# Patient Record
Sex: Female | Born: 1950 | Race: Black or African American | Hispanic: No | Marital: Married | Smoking: Never smoker
Health system: Southern US, Community
[De-identification: ages and names within clinical notes are randomized; demographics above are authoritative.]

## PROBLEM LIST (undated history)

## (undated) DIAGNOSIS — K279 Peptic ulcer, site unspecified, unspecified as acute or chronic, without hemorrhage or perforation: Secondary | ICD-10-CM

## (undated) DIAGNOSIS — G4710 Hypersomnia, unspecified: Secondary | ICD-10-CM

## (undated) DIAGNOSIS — E119 Type 2 diabetes mellitus without complications: Secondary | ICD-10-CM

## (undated) DIAGNOSIS — E782 Mixed hyperlipidemia: Secondary | ICD-10-CM

## (undated) DIAGNOSIS — Z95810 Presence of automatic (implantable) cardiac defibrillator: Secondary | ICD-10-CM

## (undated) DIAGNOSIS — I5042 Chronic combined systolic (congestive) and diastolic (congestive) heart failure: Secondary | ICD-10-CM

## (undated) DIAGNOSIS — R0989 Other specified symptoms and signs involving the circulatory and respiratory systems: Secondary | ICD-10-CM

## (undated) DIAGNOSIS — N959 Unspecified menopausal and perimenopausal disorder: Secondary | ICD-10-CM

## (undated) DIAGNOSIS — I43 Cardiomyopathy in diseases classified elsewhere: Secondary | ICD-10-CM

## (undated) DIAGNOSIS — I2510 Atherosclerotic heart disease of native coronary artery without angina pectoris: Secondary | ICD-10-CM

## (undated) DIAGNOSIS — E669 Obesity, unspecified: Secondary | ICD-10-CM

## (undated) DIAGNOSIS — J9610 Chronic respiratory failure, unspecified whether with hypoxia or hypercapnia: Secondary | ICD-10-CM

## (undated) DIAGNOSIS — I119 Hypertensive heart disease without heart failure: Secondary | ICD-10-CM

## (undated) DIAGNOSIS — I428 Other cardiomyopathies: Secondary | ICD-10-CM

## (undated) DIAGNOSIS — I10 Essential (primary) hypertension: Secondary | ICD-10-CM

## (undated) DIAGNOSIS — I6529 Occlusion and stenosis of unspecified carotid artery: Secondary | ICD-10-CM

## (undated) DIAGNOSIS — I509 Heart failure, unspecified: Secondary | ICD-10-CM

## (undated) HISTORY — DX: Other specified symptoms and signs involving the circulatory and respiratory systems: R09.89

## (undated) HISTORY — DX: Occlusion and stenosis of unspecified carotid artery: I65.29

## (undated) HISTORY — DX: Atherosclerotic heart disease of native coronary artery without angina pectoris: I25.10

## (undated) HISTORY — DX: Type 2 diabetes mellitus without complications: E11.9

## (undated) HISTORY — DX: Cardiomyopathy in diseases classified elsewhere: I43

## (undated) HISTORY — DX: Hypersomnia, unspecified: G47.10

## (undated) HISTORY — DX: Obesity, unspecified: E66.9

## (undated) HISTORY — DX: Hypertensive heart disease without heart failure: I11.9

## (undated) HISTORY — DX: Other cardiomyopathies: I42.8

## (undated) HISTORY — PX: OTHER SURGICAL HISTORY: SHX169

## (undated) HISTORY — DX: Mixed hyperlipidemia: E78.2

## (undated) HISTORY — DX: Chronic respiratory failure, unspecified whether with hypoxia or hypercapnia: J96.10

## (undated) HISTORY — DX: Essential (primary) hypertension: I10

## (undated) HISTORY — DX: Unspecified menopausal and perimenopausal disorder: N95.9

## (undated) HISTORY — DX: Chronic combined systolic (congestive) and diastolic (congestive) heart failure: I50.42

## (undated) HISTORY — DX: Peptic ulcer, site unspecified, unspecified as acute or chronic, without hemorrhage or perforation: K27.9

---

## 2002-10-26 ENCOUNTER — Encounter: Payer: Self-pay | Admitting: *Deleted

## 2002-10-26 ENCOUNTER — Emergency Department (HOSPITAL_COMMUNITY): Admission: EM | Admit: 2002-10-26 | Discharge: 2002-10-26 | Payer: Self-pay | Admitting: *Deleted

## 2003-05-18 ENCOUNTER — Emergency Department (HOSPITAL_COMMUNITY): Admission: EM | Admit: 2003-05-18 | Discharge: 2003-05-18 | Payer: Self-pay | Admitting: Emergency Medicine

## 2003-06-19 ENCOUNTER — Ambulatory Visit (HOSPITAL_COMMUNITY): Admission: RE | Admit: 2003-06-19 | Discharge: 2003-06-19 | Payer: Self-pay | Admitting: Unknown Physician Specialty

## 2012-07-23 ENCOUNTER — Emergency Department (HOSPITAL_COMMUNITY)
Admission: EM | Admit: 2012-07-23 | Discharge: 2012-07-23 | Disposition: A | Discharge status: Home / Self Care | Payer: Medicare Other | Attending: Emergency Medicine | Admitting: Emergency Medicine

## 2012-07-23 ENCOUNTER — Emergency Department (HOSPITAL_COMMUNITY): Payer: Medicare Other

## 2012-07-23 ENCOUNTER — Encounter (HOSPITAL_COMMUNITY): Payer: Self-pay | Admitting: Emergency Medicine

## 2012-07-23 DIAGNOSIS — E119 Type 2 diabetes mellitus without complications: Secondary | ICD-10-CM | POA: Insufficient documentation

## 2012-07-23 DIAGNOSIS — R0989 Other specified symptoms and signs involving the circulatory and respiratory systems: Secondary | ICD-10-CM | POA: Insufficient documentation

## 2012-07-23 DIAGNOSIS — R0609 Other forms of dyspnea: Secondary | ICD-10-CM | POA: Insufficient documentation

## 2012-07-23 DIAGNOSIS — J90 Pleural effusion, not elsewhere classified: Secondary | ICD-10-CM

## 2012-07-23 DIAGNOSIS — R0600 Dyspnea, unspecified: Secondary | ICD-10-CM

## 2012-07-23 DIAGNOSIS — Z79899 Other long term (current) drug therapy: Secondary | ICD-10-CM | POA: Insufficient documentation

## 2012-07-23 DIAGNOSIS — I10 Essential (primary) hypertension: Secondary | ICD-10-CM | POA: Insufficient documentation

## 2012-07-23 DIAGNOSIS — I509 Heart failure, unspecified: Secondary | ICD-10-CM | POA: Insufficient documentation

## 2012-07-23 HISTORY — DX: Heart failure, unspecified: I50.9

## 2012-07-23 LAB — CBC WITH DIFFERENTIAL/PLATELET
Basophils Absolute: 0 10*3/uL (ref 0.0–0.1)
Basophils Relative: 0 % (ref 0–1)
Eosinophils Absolute: 0.9 10*3/uL — ABNORMAL HIGH (ref 0.0–0.7)
HCT: 40.2 % (ref 36.0–46.0)
Hemoglobin: 13.7 g/dL (ref 12.0–15.0)
MCH: 30 pg (ref 26.0–34.0)
MCHC: 34.1 g/dL (ref 30.0–36.0)
Monocytes Absolute: 0.7 10*3/uL (ref 0.1–1.0)
Monocytes Relative: 9 % (ref 3–12)
Neutrophils Relative %: 48 % (ref 43–77)
RDW: 13.2 % (ref 11.5–15.5)

## 2012-07-23 LAB — BASIC METABOLIC PANEL
BUN: 10 mg/dL (ref 6–23)
Creatinine, Ser: 0.66 mg/dL (ref 0.50–1.10)
GFR calc Af Amer: >90 mL/min (ref >90)
GFR calc non Af Amer: >90 mL/min (ref >90)

## 2012-07-23 LAB — PRO B NATRIURETIC PEPTIDE: Pro B Natriuretic peptide (BNP): 225.9 pg/mL — ABNORMAL HIGH (ref 0–125)

## 2012-07-23 MED ORDER — IOHEXOL 350 MG/ML SOLN
100.0000 mL | Freq: Once | INTRAVENOUS | Status: AC | PRN
  Administered 2012-07-23: 100 mL via INTRAVENOUS

## 2012-07-23 MED ORDER — ASPIRIN 81 MG PO CHEW
324.0000 mg | CHEWABLE_TABLET | Freq: Once | ORAL | Status: AC
  Administered 2012-07-23: 324 mg via ORAL
  Filled 2012-07-23: qty 4

## 2012-07-23 MED ORDER — FUROSEMIDE 20 MG PO TABS: 20.0000 mg | ORAL_TABLET | Freq: Every day | ORAL | Status: DC

## 2012-07-23 NOTE — ED Provider Notes (Signed)
History     CSN: [id number]  Arrival date & time 07/23/12  1532   First MD Initiated Contact with Patient 07/23/12 1653      Chief Complaint  Patient presents with  . Shortness of Breath    (Consider location/radiation/quality/duration/timing/severity/associated sxs/prior treatment) Patient is a 62 y.o. female presenting with shortness of breath. The history is provided by the patient.  Shortness of Breath She has been having progressive exertional dyspnea over the last week. She gets short of breath with putting on her clothes or walking short distances as short as 40 or 50 feet. She did have some aching pain in the right upper back which lasts about 3 days but has been gone for the last 2 days. She has a slight cough which is productive of a small amount of clear sputum. She denies chills or sweats. Denies nausea or vomiting. She's not tried anything to help her symptoms. She does have history of congestive heart failure and always sleeps sitting up because she gets short of breath if she lays flat. This has not changed. She has not noticed any change in the swelling in her legs. On one other occasion in the past, she had similar trouble breathing and she saw a lung specialist for 3 months with no diagnosis having been made.  Past Medical History  Diagnosis Date  . Hypertension   . CHF (congestive heart failure)   . Diabetes mellitus without complication     History reviewed. No pertinent past surgical history.  No family history on file.  History  Substance Use Topics  . Smoking status: Never Smoker   . Smokeless tobacco: Not on file  . Alcohol Use: No    OB History   Grav Para Term Preterm Abortions TAB SAB Ect Mult Living                  Review of Systems  Respiratory: Positive for shortness of breath.   All other systems reviewed and are negative.    Allergies  Review of patient's allergies indicates no known allergies.  Home Medications   Current  Outpatient Rx  Name  Route  Sig  Dispense  Refill  . carvedilol (COREG) 25 MG tablet   Oral   Take 25 mg by mouth 2 (two) times daily with a meal.         . metFORMIN (GLUCOPHAGE) 500 MG tablet   Oral   Take 500 mg by mouth 2 (two) times daily with a meal.         . omeprazole (PRILOSEC) 20 MG capsule   Oral   Take 20 mg by mouth 2 (two) times daily.           BP 181/86  Pulse 92  Temp(Src) 98.4 F (36.9 C) (Oral)  SpO2 96%  Physical Exam  Nursing note and vitals reviewed.  62 year old female, resting comfortably and in no acute distress. Vital signs are significant for hypertension with blood pressure 181/86. Oxygen saturation is 96%, which is normal. Head is normocephalic and atraumatic. PERRLA, EOMI. Oropharynx is clear. Neck is nontender and supple without adenopathy or JVD. Back is nontender and there is no CVA tenderness. There is no presacral edema. Lungs have decreased breath sounds with faint rales at both bases. There are no wheezes or rhonchi. Chest is nontender. Heart has regular rate and rhythm without murmur. Abdomen is soft, flat, nontender without masses or hepatosplenomegaly and peristalsis is normoactive. Extremities have 1-2+ edema,  full range of motion is present. Skin is warm and dry without rash. Neurologic: Mental status is normal, cranial nerves are intact, there are no motor or sensory deficits.  ED Course  Procedures (including critical care time)  Results for orders placed during the hospital encounter of 07/23/12  CBC WITH DIFFERENTIAL      Result Value Range   WBC 8.0  4.0 - 10.5 K/uL   RBC 4.56  3.87 - 5.11 MIL/uL   Hemoglobin 13.7  12.0 - 15.0 g/dL   HCT 40.9  81.1 - 91.4 %   MCV 88.2  78.0 - 100.0 fL   MCH 30.0  26.0 - 34.0 pg   MCHC 34.1  30.0 - 36.0 g/dL   RDW 78.2  95.6 - 21.3 %   Platelets 241  150 - 400 K/uL   Neutrophils Relative 48  43 - 77 %   Neutro Abs 3.8  1.7 - 7.7 K/uL   Lymphocytes Relative 32  12 - 46 %    Lymphs Abs 2.5  0.7 - 4.0 K/uL   Monocytes Relative 9  3 - 12 %   Monocytes Absolute 0.7  0.1 - 1.0 K/uL   Eosinophils Relative 11 (*) 0 - 5 %   Eosinophils Absolute 0.9 (*) 0.0 - 0.7 K/uL   Basophils Relative 0  0 - 1 %   Basophils Absolute 0.0  0.0 - 0.1 K/uL  BASIC METABOLIC PANEL      Result Value Range   Sodium 139  135 - 145 mEq/L   Potassium 3.6  3.5 - 5.1 mEq/L   Chloride 103  96 - 112 mEq/L   CO2 28  19 - 32 mEq/L   Glucose, Bld 155 (*) 70 - 99 mg/dL   BUN 10  6 - 23 mg/dL   Creatinine, Ser 0.86  0.50 - 1.10 mg/dL   Calcium 57.8  8.4 - 46.9 mg/dL   GFR calc non Af Amer >90  >90 mL/min   GFR calc Af Amer >90  >90 mL/min  PRO B NATRIURETIC PEPTIDE      Result Value Range   Pro B Natriuretic peptide (BNP) 225.9 (*) 0 - 125 pg/mL  TROPONIN I      Result Value Range   Troponin I <0.30  <0.30 ng/mL  D-DIMER, QUANTITATIVE      Result Value Range   D-Dimer, Quant 2.90 (*) 0.00 - 0.48 ug/mL-FEU   Dg Chest 2 View  07/23/2012  *RADIOLOGY REPORT*  Clinical Data: Right chest pain.  Shortness of breath.  CHEST - 2 VIEW  Comparison: Plain film chest 06/19/2003.  Findings: The patient has a moderate right pleural effusion and right basilar airspace disease.  Trace left pleural effusion is noted.  There is cardiomegaly and mild interstitial edema.  IMPRESSION:  1.  Right effusion basilar airspace disease which could represent atelectasis or pneumonia. 2.  Cardiomegaly and mild interstitial edema.   Original Report Authenticated By: Holley Dexter, M.D.    Ct Angio Chest W/cm &/or Wo Cm  07/23/2012  *RADIOLOGY REPORT*  Clinical Data: Shortness of breath, elevated D-dimer, question pulmonary embolism, history hypertension, CHF, diabetes  CT ANGIOGRAPHY CHEST  Technique:  Multidetector CT imaging of the chest using the standard protocol during bolus administration of intravenous contrast. Multiplanar reconstructed images including MIPs were obtained and reviewed to evaluate the vascular  anatomy.  Contrast: OMNIPAQUE IOHEXOL 350 MG/ML SOLN  Comparison: None  Findings: Aorta normal caliber with scattered atherosclerotic calcifications of the  aorta and coronary arteries. No aortic dissection identified. Beam hardening artifacts from dense contrast in SVC. Pulmonary arteries patent. No evidence of pulmonary embolism.  Visualized portion of upper abdomen normal appearance. Small to moderate right pleural effusion, partially loculated. Normal-sized lymph nodes at right hilum without definite thoracic adenopathy. Significant atelectasis right lower lobe, minimally left lower lobe and right middle lobe. No definite infiltrate or pneumothorax. No acute osseous findings.  IMPRESSION: No evidence of pulmonary embolism. Small to moderate right pleural effusion which is partially loculated. Scattered atelectasis greatest right lower lobe.   Original Report Authenticated By: Ulyses Southward, M.D.     Images viewed by me.   Date: 07/23/2012  Rate: 80  Rhythm: normal sinus rhythm  QRS Axis: normal  Intervals: normal  ST/T Wave abnormalities: nonspecific ST/T changes  Conduction Disutrbances:none  Narrative Interpretation: Left ventricular hypertrophy with secondary repolarization changes. No prior ECG available for comparison.  Old EKG Reviewed: none available    1. Dyspnea   2. Pleural effusion, right       MDM  Dyspnea which most likely is an exacerbation of congestive heart failure. 2 we'll need to be screened for her possible pulmonary emboli since CHF is a risk factor for PE. Workup has been initiated including chest x-ray, BNP, d-dimer.  X-ray shows small pleural effusion. BNP is mildly elevated. D-dimer is markedly elevated. She'll be sent for CT angiogram to rule out pulmonary bolus him.  CT angiogram shows of her pleural effusion is larger than appreciated on chest x-ray and there are also areas of atelectasis. This seems to be what is causing her dyspnea. She does have  peripheral edema and is slightly elevated BNP. She'll be started on furosemide. She states that she has been on furosemide in the past but it was stopped and she's not sure what dose she was on. Presumably, she had it stopped because of some problems with renal function so she'll be started back on a low dose of furosemide. She's given a prescription for furosemide 20 mg and she is to followup with her cardiologist in the coming week.  Dione Booze, MD 07/23/12 2155

## 2012-07-23 NOTE — ED Notes (Signed)
Pt states SOB unchanged over past 7 days but always worsens with any movement

## 2012-07-23 NOTE — ED Notes (Signed)
Pt states she has been SOBx 7 days, states any movement  Makes it worse. Denies CP at this time but states she did have some R upper chest pain near clavicle 5 days ago. Denies swelling to legs.

## 2012-07-23 NOTE — ED Notes (Signed)
Attempted to start 20 gtt IV in the Lt. Forearm. unsuccessful stick. Charge RN to attempt at this time. Campos[employer]Garcia, [employer]

## 2012-08-08 ENCOUNTER — Encounter (HOSPITAL_COMMUNITY): Payer: Self-pay | Admitting: *Deleted

## 2012-08-08 ENCOUNTER — Emergency Department (HOSPITAL_COMMUNITY): Payer: Medicare Other

## 2012-08-08 ENCOUNTER — Emergency Department (HOSPITAL_COMMUNITY)
Admission: EM | Admit: 2012-08-08 | Discharge: 2012-08-08 | Disposition: A | Discharge status: Home / Self Care | Payer: Medicare Other | Attending: Emergency Medicine | Admitting: Emergency Medicine

## 2012-08-08 DIAGNOSIS — I10 Essential (primary) hypertension: Secondary | ICD-10-CM | POA: Insufficient documentation

## 2012-08-08 DIAGNOSIS — R000 Tachycardia, unspecified: Secondary | ICD-10-CM | POA: Insufficient documentation

## 2012-08-08 DIAGNOSIS — E119 Type 2 diabetes mellitus without complications: Secondary | ICD-10-CM | POA: Insufficient documentation

## 2012-08-08 DIAGNOSIS — I509 Heart failure, unspecified: Secondary | ICD-10-CM

## 2012-08-08 DIAGNOSIS — Z79899 Other long term (current) drug therapy: Secondary | ICD-10-CM | POA: Insufficient documentation

## 2012-08-08 DIAGNOSIS — R509 Fever, unspecified: Secondary | ICD-10-CM | POA: Insufficient documentation

## 2012-08-08 DIAGNOSIS — M7989 Other specified soft tissue disorders: Secondary | ICD-10-CM | POA: Insufficient documentation

## 2012-08-08 LAB — CBC
HCT: 42.5 % (ref 36.0–46.0)
MCH: 29.3 pg (ref 26.0–34.0)
MCV: 88.9 fL (ref 78.0–100.0)
Platelets: 264 10*3/uL (ref 150–400)
RBC: 4.78 MIL/uL (ref 3.87–5.11)
WBC: 7.4 10*3/uL (ref 4.0–10.5)

## 2012-08-08 LAB — POCT I-STAT, CHEM 8
Calcium, Ion: 1.31 mmol/L — ABNORMAL HIGH (ref 1.13–1.30)
Chloride: 105 mEq/L (ref 96–112)
Creatinine, Ser: 0.7 mg/dL (ref 0.50–1.10)
Glucose, Bld: 204 mg/dL — ABNORMAL HIGH (ref 70–99)
Potassium: 3.9 mEq/L (ref 3.5–5.1)

## 2012-08-08 MED ORDER — FUROSEMIDE 10 MG/ML IJ SOLN
40.0000 mg | Freq: Once | INTRAMUSCULAR | Status: AC
  Administered 2012-08-08: 40 mg via INTRAVENOUS
  Filled 2012-08-08: qty 4

## 2012-08-08 NOTE — ED Notes (Signed)
EKG old and new given to EDP, Dierdre Highman, MD.

## 2012-08-08 NOTE — ED Provider Notes (Signed)
History     CSN: [id number]  Arrival date & time 08/08/12  0212   First MD Initiated Contact with Patient 08/08/12 0305      Chief Complaint  Patient presents with  . Shortness of Breath    (Consider location/radiation/quality/duration/timing/severity/associated sxs/prior treatment) HPI History per patient. SOB worse over the last 24 hours, worse with ambulation, has noticed some inc ankle swelling and has h/o CHF. No Cp or cough.  Felt feverish tonight no chills. No rash. No N/V some diarrhea no blood in stools. No ABD pain. Is prescribed lasix and takes it intermittently - states I have to go to church and I cant take it on the weekends. Symptoms MOD in severity. Has slept sitting up for years unable to lay flat Past Medical History  Diagnosis Date  . Hypertension   . CHF (congestive heart failure)   . Diabetes mellitus without complication     History reviewed. No pertinent past surgical history.  No family history on file.  History  Substance Use Topics  . Smoking status: Never Smoker   . Smokeless tobacco: Not on file  . Alcohol Use: No    OB History   Grav Para Term Preterm Abortions TAB SAB Ect Mult Living                  Review of Systems  Constitutional: Negative for fever and chills.  HENT: Negative for neck pain and neck stiffness.   Eyes: Negative for pain.  Respiratory: Positive for shortness of breath.   Cardiovascular: Positive for leg swelling. Negative for chest pain.  Gastrointestinal: Negative for abdominal pain.  Genitourinary: Negative for dysuria.  Musculoskeletal: Negative for back pain.  Skin: Negative for rash.  Neurological: Negative for headaches.  All other systems reviewed and are negative.    Allergies  Review of patient's allergies indicates no known allergies.  Home Medications   Current Outpatient Rx  Name  Route  Sig  Dispense  Refill  . carvedilol (COREG) 25 MG tablet   Oral   Take 25 mg by mouth 2 (two) times daily  with a meal.         . cyclobenzaprine (FLEXERIL) 10 MG tablet   Oral   Take 10 mg by mouth 2 (two) times daily as needed for muscle spasms.         . furosemide (LASIX) 20 MG tablet   Oral   Take 1 tablet (20 mg total) by mouth daily.   15 tablet   0   . glipiZIDE (GLUCOTROL) 5 MG tablet   Oral   Take 5 mg by mouth daily.         Marland Kitchen HYDROcodone-acetaminophen (NORCO/VICODIN) 5-325 MG per tablet   Oral   Take 1 tablet by mouth every 6 (six) hours as needed for pain.         Marland Kitchen losartan (COZAAR) 50 MG tablet   Oral   Take 50 mg by mouth daily.         . metFORMIN (GLUCOPHAGE) 500 MG tablet   Oral   Take 1,000 mg by mouth 2 (two) times daily with a meal.          . nitrofurantoin, macrocrystal-monohydrate, (MACROBID) 100 MG capsule   Oral   Take 100 mg by mouth 2 (two) times daily.         Marland Kitchen omeprazole (PRILOSEC) 20 MG capsule   Oral   Take 20 mg by mouth 2 (two) times daily.  BP 161/95  Pulse 109  Temp(Src) 97.7 F (36.5 C)  Resp 28  SpO2 93%  Physical Exam  Constitutional: She is oriented to person, place, and time. She appears well-developed and well-nourished.  HENT:  Head: Normocephalic and atraumatic.  Eyes: EOM are normal. Pupils are equal, round, and reactive to light.  Neck: Neck supple.  Cardiovascular: Regular rhythm and intact distal pulses.   Mild tachycardia  Pulmonary/Chest: Effort normal. No respiratory distress.  mildly dec bilateral breath sounds with posterior rales  Abdominal: Bowel sounds are normal.  Musculoskeletal: Normal range of motion. She exhibits no edema.  Neurological: She is alert and oriented to person, place, and time.  Skin: Skin is warm and dry.    ED Course  Procedures (including critical care time)  Results for orders placed during the hospital encounter of 08/08/12  CBC      Result Value Range   WBC 7.4  4.0 - 10.5 K/uL   RBC 4.78  3.87 - 5.11 MIL/uL   Hemoglobin 14.0  12.0 - 15.0 g/dL    HCT 16.1  09.6 - 04.5 %   MCV 88.9  78.0 - 100.0 fL   MCH 29.3  26.0 - 34.0 pg   MCHC 32.9  30.0 - 36.0 g/dL   RDW 40.9  81.1 - 91.4 %   Platelets 264  150 - 400 K/uL  PRO B NATRIURETIC PEPTIDE      Result Value Range   Pro B Natriuretic peptide (BNP) 260.4 (*) 0 - 125 pg/mL  POCT I-STAT, CHEM 8      Result Value Range   Sodium 141  135 - 145 mEq/L   Potassium 3.9  3.5 - 5.1 mEq/L   Chloride 105  96 - 112 mEq/L   BUN 10  6 - 23 mg/dL   Creatinine, Ser 7.82  0.50 - 1.10 mg/dL   Glucose, Bld 956 (*) 70 - 99 mg/dL   Calcium, Ion 2.13 (*) 1.13 - 1.30 mmol/L   TCO2 31  0 - 100 mmol/L   Hemoglobin 15.0  12.0 - 15.0 g/dL   HCT 08.6  57.8 - 46.9 %  POCT I-STAT TROPONIN I      Result Value Range   Troponin i, poc 0.02  0.00 - 0.08 ng/mL   Comment 3              Dg Chest Portable 1 View  08/08/2012  *RADIOLOGY REPORT*  Clinical Data: Short of breath.  PORTABLE CHEST - 1 VIEW  Comparison: CT 07/23/2012.  Findings: Right pleural effusion is present which is subpulmonic. Mild basilar predominant airspace disease is present along with pulmonary vascular congestion consistent with pulmonary edema. Collapse / consolidation of the right lung base.  No pneumothorax. Cardiopericardial silhouette is mildly enlarged. Although superimposed pneumonia is difficult to exclude, the constellation of findings favors CHF.  IMPRESSION: Findings most compatible with mild to moderate CHF with right pleural effusion and likely compressive atelectasis of the right lower lobe.   Original Report Authenticated By: Andreas Newport, M.D.        Date: 08/08/2012  Rate: 104  Rhythm: sinus tachycardia  QRS Axis: normal  Intervals: normal  ST/T Wave abnormalities: nonspecific ST/T changes  Conduction Disutrbances:none  Narrative Interpretation: ST with LVH  Old EKG Reviewed: unchanged  IV Lasix, O2 provided  5:13 AM ambulating no acute distress. No sig hypoxia. Patient requesting to be discharged home. She agrees  to take Lasix as prescribed and followup with her physician.  MDM  Mild CHF exacerbation due to medical noncompliance  Evaluated with chest x-ray, EKG and labs reviewed as above  Lasix provided and serial evaluations performed  Patient is comfortable for discharge home. Plan close outpatient followup  Vital signs and nursing notes reviewed        Sunnie Nielsen, MD 08/08/12 [id number]

## 2012-08-08 NOTE — ED Notes (Signed)
Pt c/o shortness of breath since 8pm; felt like she had a fever; bp elevated and felt like heart rate elevated; denies chest pain; pain left lower rib cage in back

## 2012-08-08 NOTE — ED Notes (Addendum)
Pt. Oxygen level 91% room air while ambulating down the hall and back to her room without assistance. Pt. States she felt fine while ambulating down the hall. Pt. Gait was steady.

## 2013-02-20 ENCOUNTER — Encounter: Payer: Self-pay | Admitting: Cardiology

## 2013-02-20 DIAGNOSIS — I509 Heart failure, unspecified: Secondary | ICD-10-CM | POA: Insufficient documentation

## 2013-02-20 DIAGNOSIS — E119 Type 2 diabetes mellitus without complications: Secondary | ICD-10-CM | POA: Insufficient documentation

## 2013-02-20 DIAGNOSIS — I10 Essential (primary) hypertension: Secondary | ICD-10-CM | POA: Insufficient documentation

## 2013-02-21 ENCOUNTER — Ambulatory Visit (INDEPENDENT_AMBULATORY_CARE_PROVIDER_SITE_OTHER): Payer: Medicare Other | Admitting: Cardiology

## 2013-02-21 ENCOUNTER — Encounter: Payer: Self-pay | Admitting: Cardiology

## 2013-02-21 ENCOUNTER — Encounter: Payer: Self-pay | Admitting: *Deleted

## 2013-02-21 ENCOUNTER — Other Ambulatory Visit: Payer: Self-pay | Admitting: Cardiology

## 2013-02-21 VITALS — BP 168/90 | HR 83 | Ht 64.0 in | Wt 237.1 lb

## 2013-02-21 DIAGNOSIS — I10 Essential (primary) hypertension: Secondary | ICD-10-CM

## 2013-02-21 DIAGNOSIS — I428 Other cardiomyopathies: Secondary | ICD-10-CM

## 2013-02-21 DIAGNOSIS — I420 Dilated cardiomyopathy: Secondary | ICD-10-CM | POA: Insufficient documentation

## 2013-02-21 DIAGNOSIS — I509 Heart failure, unspecified: Secondary | ICD-10-CM

## 2013-02-21 MED ORDER — LOSARTAN POTASSIUM 100 MG PO TABS: 100.0000 mg | ORAL_TABLET | Freq: Every day | ORAL | Status: DC

## 2013-02-21 NOTE — Assessment & Plan Note (Signed)
Continue present dose of Lasix.we will most likely add spironolactone in the future.

## 2013-02-21 NOTE — Progress Notes (Signed)
HPI: 62 year old female for evaluation of congestive heart failure/cardiomyopathy. Previously followed in [facility]. Last echocardiogram in July of 2014 showed an ejection fraction of 25-30% with grade 2 diastolic dysfunction. There was mild left atrial enlargement. There was mild mitral regurgitation. Nuclear study in April of 2014 showed normal perfusion. Ejection fraction was 24%. Carotid Dopplers in April 2014 showed less than 39% left carotid stenosis. Chest CT in March of 2014 showed no pulmonary embolus, small to moderate right effusion which was felt to be partially loculated. Apparently patient has had poor compliance with treatment and poor tolerance of treatment based on outside records. Patient states that she has had dyspnea on exertion in the past. However over the past year it has worsened markedly. She has chronic orthopnea. No PND, palpitations or chest pain. Occasional mild pedal edema.  Current Outpatient Prescriptions  Medication Sig Dispense Refill  . aspirin 81 MG tablet Take 81 mg by mouth daily.      . carvedilol (COREG) 25 MG tablet Take 25 mg by mouth 2 (two) times daily with a meal.      . furosemide (LASIX) 20 MG tablet Take 1 tablet (20 mg total) by mouth daily.  15 tablet  0  . glipiZIDE (GLUCOTROL) 5 MG tablet Take 5 mg by mouth daily.      Marland Kitchen losartan (COZAAR) 50 MG tablet Take 50 mg by mouth daily.      . metFORMIN (GLUCOPHAGE) 500 MG tablet Take 1,000 mg by mouth 2 (two) times daily with a meal.       . omeprazole (PRILOSEC) 20 MG capsule Take 20 mg by mouth 2 (two) times daily.       No current facility-administered medications for this visit.    No Known Allergies  Past Medical History  Diagnosis Date  . Hypertensive cardiomyopathy     By report there is poor compliance w/treatment, poor tolerance of treatment & poor symptom control. Symptoms include dyspnea & DOE.  . Essential hypertension, benign   . Diabetes mellitus, type 2   . Obesity   .  Hyperlipidemia, mixed   . Unspecified menopausal and postmenopausal disorder   . Bruit     Bilat carotid duplex 09/05/12 CONCLUSION: Mild, less than or equal to 39%, left internal carotid artery stenosis.  . Hypersomnia, unspecified   . CHF (congestive heart failure)     MPI 09/05/12 NORMAL, showed no scar & LVEF is 20-25%. ECHO 12/11/12 LV is markedly dilated, Overall LV systolic function severely impaired with EF = 25-30%, pseudonormal LV filling pattern (consistant with elevated LA pressure).  . PUD (peptic ulcer disease)     History reviewed. No pertinent past surgical history.  History   Social History  . Marital Status: Married    Spouse Name: N/A    Number of Children: 5  . Years of Education: N/A   Occupational History  .      Retired   Social History Main Topics  . Smoking status: Never Smoker   . Smokeless tobacco: Not on file  . Alcohol Use: No  . Drug Use: No  . Sexual Activity: No   Other Topics Concern  . Not on file   Social History Narrative  . No narrative on file    Family History  Problem Relation Age of Onset  . Diabetes Mellitus II Mother   . Cancer Brother     Colon  . Hypertension Father     ROS: no fevers or  chills, productive cough, hemoptysis, dysphasia, odynophagia, melena, hematochezia, dysuria, hematuria, rash, seizure activity, claudication. Remaining systems are negative.  Physical Exam:   Blood pressure 168/90, pulse 83, height 5\' 4"  (1.626 m), weight 237 lb 1.9 oz (107.557 kg).  General:  Well developed/obese in NAD Skin warm/dry Patient not depressed No peripheral clubbing Back-normal HEENT-normal/normal eyelids Neck supple/normal carotid upstroke bilaterally; no bruits; no JVD; no thyromegaly chest - CTA/ normal expansion CV - RRR/normal S1 and S2; no murmurs, rubs or gallops;  PMI nondisplaced Abdomen -NT/ND, no HSM, no mass, + bowel sounds, no bruit 2+ femoral pulses, no bruits Ext-trace edema, no chords, 2+  DP Neuro-grossly nonfocal  ECG Sinus rhythm, LVH with repolarization abnormality.

## 2013-02-21 NOTE — Assessment & Plan Note (Signed)
Etiology of cardiomyopathy is most likely hypertension. However she is having significant worsening of dyspnea on exertion. She also has 7-8 years of diabetes mellitus. I think we need to pursue right and left cardiac catheterization to exclude coronary disease and also to help manage her congestive heart failure. The risks and benefits were discussed and she agrees to proceed. Continue aspirin, beta blocker and increase Cozaar to 100 mg daily. If coronary artery disease is demonstrated we will add a statin. I will add spironolactone in the future. I stressed compliance with medications. I will check TSH to exclude a contribution of thyroid issues to her cardiomyopathy. Once her blood pressure is controlled we will need to repeat her echocardiogram. Hopefully LV function will improve with blood pressure control. If ejection fraction is less than 35% once blood pressure controlled we will need to consider ICD. I stressed the importance of low sodium diet.

## 2013-02-21 NOTE — Assessment & Plan Note (Signed)
Blood pressure is elevated today. Increase Cozaar to 100 mg daily. Continue to titrate medications as needed. I would most likely increase Coreg at next office visit if blood pressure remains high. We could also consider adding hydralazine/nitrate given LV dysfunction. Patient will monitor blood pressure at home and we will make additional changes as needed.

## 2013-02-21 NOTE — Patient Instructions (Addendum)
Your physician has requested that you have a cardiac catheterization. Cardiac catheterization is used to diagnose and/or treat various heart conditions. Doctors may recommend this procedure for a number of different reasons. The most common reason is to evaluate chest pain. Chest pain can be a symptom of coronary artery disease (CAD), and cardiac catheterization can show whether plaque is narrowing or blocking your heart's arteries. This procedure is also used to evaluate the valves, as well as measure the blood flow and oxygen levels in different parts of your heart. For further information please visit [email or web address] Please follow instruction sheet, as given.   Your physician recommends that you have lab work today  A chest x-ray takes a picture of the organs and structures inside the chest, including the heart, lungs, and blood vessels. This test can show several things, including, whether the heart is enlarges; whether fluid is building up in the lungs; and whether pacemaker / defibrillator leads are still in place.   TRACK BLOOD PRESSURE AND BRING BLOOD PRESSURE CUFF TO NEXT APPT  Your physician recommends that you schedule a follow-up appointment in: 6 WEEKS WITH DR CRENSHAW   INCREASE LOSARTAN TO 100 MG ONCE DAILY

## 2013-02-22 LAB — BASIC METABOLIC PANEL
BUN: 12 mg/dL (ref 6–23)
CO2: 30 mEq/L (ref 19–32)
Chloride: 102 mEq/L (ref 96–112)
Glucose, Bld: 165 mg/dL — ABNORMAL HIGH (ref 70–99)
Potassium: 4.5 mEq/L (ref 3.5–5.1)
Sodium: 141 mEq/L (ref 135–145)

## 2013-02-22 LAB — CBC WITH DIFFERENTIAL/PLATELET
Basophils Absolute: 0 10*3/uL (ref 0.0–0.1)
HCT: 40.6 % (ref 36.0–46.0)
Hemoglobin: 13.6 g/dL (ref 12.0–15.0)
Lymphs Abs: 2.6 10*3/uL (ref 0.7–4.0)
MCHC: 33.5 g/dL (ref 30.0–36.0)
MCV: 89.3 fl (ref 78.0–100.0)
Monocytes Absolute: 0.6 10*3/uL (ref 0.1–1.0)
Monocytes Relative: 6.4 % (ref 3.0–12.0)
Neutro Abs: 5.2 10*3/uL (ref 1.4–7.7)
Platelets: 259 10*3/uL (ref 150.0–400.0)
RDW: 15 % — ABNORMAL HIGH (ref 11.5–14.6)

## 2013-02-23 ENCOUNTER — Encounter (HOSPITAL_COMMUNITY): Payer: Self-pay | Admitting: Pharmacy Technician

## 2013-02-23 ENCOUNTER — Ambulatory Visit (INDEPENDENT_AMBULATORY_CARE_PROVIDER_SITE_OTHER)
Admission: RE | Admit: 2013-02-23 | Discharge: 2013-02-23 | Disposition: A | Discharge status: Home / Self Care | Payer: Medicare Other | Source: Ambulatory Visit | Attending: Cardiology | Admitting: Cardiology

## 2013-02-23 DIAGNOSIS — I509 Heart failure, unspecified: Secondary | ICD-10-CM

## 2013-02-23 DIAGNOSIS — I10 Essential (primary) hypertension: Secondary | ICD-10-CM

## 2013-02-27 ENCOUNTER — Ambulatory Visit (HOSPITAL_COMMUNITY)
Admission: RE | Admit: 2013-02-27 | Discharge: 2013-02-27 | Disposition: A | Discharge status: Home / Self Care | Payer: Medicare Other | Source: Ambulatory Visit | Attending: Cardiology | Admitting: Cardiology

## 2013-02-27 ENCOUNTER — Encounter (HOSPITAL_COMMUNITY)
Admission: RE | Disposition: A | Discharge status: Home / Self Care | Payer: Self-pay | Source: Ambulatory Visit | Attending: Cardiology

## 2013-02-27 DIAGNOSIS — I43 Cardiomyopathy in diseases classified elsewhere: Secondary | ICD-10-CM | POA: Insufficient documentation

## 2013-02-27 DIAGNOSIS — E782 Mixed hyperlipidemia: Secondary | ICD-10-CM | POA: Insufficient documentation

## 2013-02-27 DIAGNOSIS — Z9119 Patient's noncompliance with other medical treatment and regimen: Secondary | ICD-10-CM | POA: Insufficient documentation

## 2013-02-27 DIAGNOSIS — E119 Type 2 diabetes mellitus without complications: Secondary | ICD-10-CM | POA: Insufficient documentation

## 2013-02-27 DIAGNOSIS — I110 Hypertensive heart disease with heart failure: Secondary | ICD-10-CM | POA: Insufficient documentation

## 2013-02-27 DIAGNOSIS — I428 Other cardiomyopathies: Secondary | ICD-10-CM

## 2013-02-27 DIAGNOSIS — I2510 Atherosclerotic heart disease of native coronary artery without angina pectoris: Secondary | ICD-10-CM | POA: Insufficient documentation

## 2013-02-27 DIAGNOSIS — Z91199 Patient's noncompliance with other medical treatment and regimen due to unspecified reason: Secondary | ICD-10-CM | POA: Insufficient documentation

## 2013-02-27 DIAGNOSIS — E669 Obesity, unspecified: Secondary | ICD-10-CM | POA: Insufficient documentation

## 2013-02-27 DIAGNOSIS — I509 Heart failure, unspecified: Secondary | ICD-10-CM | POA: Insufficient documentation

## 2013-02-27 HISTORY — PX: LEFT AND RIGHT HEART CATHETERIZATION WITH CORONARY ANGIOGRAM: SHX5449

## 2013-02-27 LAB — POCT I-STAT 3, VENOUS BLOOD GAS (G3P V)
O2 Saturation: 56 %
TCO2: 30 mmol/L (ref 0–100)
pCO2, Ven: 50.1 mmHg — ABNORMAL HIGH (ref 45.0–50.0)
pH, Ven: 7.369 — ABNORMAL HIGH (ref 7.250–7.300)
pO2, Ven: 31 mmHg (ref 30.0–45.0)

## 2013-02-27 LAB — GLUCOSE, CAPILLARY
Glucose-Capillary: 147 mg/dL — ABNORMAL HIGH (ref 70–99)
Glucose-Capillary: 139 mg/dL — ABNORMAL HIGH (ref 70–99)

## 2013-02-27 LAB — POCT I-STAT 3, ART BLOOD GAS (G3+)
Bicarbonate: 28.5 mEq/L — ABNORMAL HIGH (ref 20.0–24.0)
O2 Saturation: 88 %
TCO2: 30 mmol/L (ref 0–100)
pCO2 arterial: 46 mmHg — ABNORMAL HIGH (ref 35.0–45.0)
pH, Arterial: 7.399 (ref 7.350–7.450)

## 2013-02-27 SURGERY — LEFT AND RIGHT HEART CATHETERIZATION WITH CORONARY ANGIOGRAM
Anesthesia: LOCAL

## 2013-02-27 MED ORDER — ASPIRIN 81 MG PO CHEW
81.0000 mg | CHEWABLE_TABLET | ORAL | Status: AC
  Administered 2013-02-27: 81 mg via ORAL

## 2013-02-27 MED ORDER — DIAZEPAM 2 MG PO TABS
ORAL_TABLET | ORAL | Status: AC
  Filled 2013-02-27: qty 1

## 2013-02-27 MED ORDER — SODIUM CHLORIDE 0.9 % IV SOLN
INTRAVENOUS | Status: DC
  Administered 2013-02-27: 06:00:00 via INTRAVENOUS

## 2013-02-27 MED ORDER — SODIUM CHLORIDE 0.9 % IV SOLN: 250.0000 mL | INTRAVENOUS | Status: DC | PRN

## 2013-02-27 MED ORDER — SODIUM CHLORIDE 0.9 % IJ SOLN: 3.0000 mL | Freq: Two times a day (BID) | INTRAMUSCULAR | Status: DC

## 2013-02-27 MED ORDER — DIAZEPAM 2 MG PO TABS
2.0000 mg | ORAL_TABLET | ORAL | Status: AC
  Administered 2013-02-27: 2 mg via ORAL
  Filled 2013-02-27: qty 1

## 2013-02-27 MED ORDER — ASPIRIN 81 MG PO CHEW
CHEWABLE_TABLET | ORAL | Status: AC
  Filled 2013-02-27: qty 1

## 2013-02-27 MED ORDER — ACETAMINOPHEN 325 MG PO TABS: 650.0000 mg | ORAL_TABLET | ORAL | Status: DC | PRN

## 2013-02-27 MED ORDER — ONDANSETRON HCL 4 MG/2ML IJ SOLN: 4.0000 mg | Freq: Four times a day (QID) | INTRAMUSCULAR | Status: DC | PRN

## 2013-02-27 MED ORDER — SODIUM CHLORIDE 0.9 % IJ SOLN: 3.0000 mL | INTRAMUSCULAR | Status: DC | PRN

## 2013-02-27 MED ORDER — SODIUM CHLORIDE 0.9 % IV SOLN: INTRAVENOUS | Status: AC

## 2013-02-27 NOTE — CV Procedure (Addendum)
   Cardiac Catheterization Procedure Note  Name: Anna Beltran MRN: [id number] DOB: 08/06/1950  Procedure: Right Heart Cath, Left Heart Cath, Selective Coronary Angiography, LV angiography  Indication:  Cardiomyopathy, rule out CAD.  Right heart pressures for medical management.  Procedural Details: The right groin was prepped, draped, and anesthetized with 1% lidocaine. Using the modified Seldinger technique a 5 French sheath was placed in the right femoral artery and a 7 French sheath was placed in the right femoral vein. A Smart needle was required to cannulate the vein.  This was a deep vessel. A Swan-Ganz catheter was used for the right heart catheterization. Standard protocol was followed for recording of right heart pressures and sampling of oxygen saturations. Fick cardiac output was calculated. Standard Judkins catheters were used for selective coronary angiography and left ventriculography. There were no immediate procedural complications. The patient was transferred to the post catheterization recovery area for further monitoring.  Procedural Findings: Hemodynamics:               RA 4    RV 45/2    PA 40/11  Mean 25    PCWP 15    LV 173/13    AO 173/93   Oxygen saturations:    PA 56%    AO 88%   Cardiac Output (Fick) 4.74                               Cardiac Index (Fick) 2.25   Coronary angiography:  Coronary dominance: right  Left mainstem: Normal  Left anterior descending (LAD):  Large vessel wrapping the apex.  Mild long distal 40% stenosis.   D1 small with ostial 25% stenosis.  D2 moderate sized with ostial 25% stenosis.   Left circumflex (LCx): AV groove normal.  OM1 small and normal.  PL large and normal  Right coronary artery (RCA):   Large and dominant vessel.  Normal.  PDA moderate sized and mid long 25% stenosis.  Left ventriculography: Left ventricular systolic with global hypokinesis, LVEF is estimated at 30%, there is no significant mitral regurgitation  .  Final Conclusions:  Mild coronary plaque.  Moderate nonischemic cardiomyopathy.  Normal right heart pressures.  Recommendations: Medical management per Dr. Jens Som.    Rollene Rotunda 02/27/2013, 9:58 AM

## 2013-02-27 NOTE — Progress Notes (Signed)
Voided 500cc of yellow urine

## 2013-02-27 NOTE — Progress Notes (Signed)
Pt discharge instructions given per MD order.  Pt and CG able to verbalized understanding.  Pt denies any discomfort at this time.  Pt to car via wheelchair.

## 2013-02-27 NOTE — H&P (View-Only) (Signed)
   HPI: 62-year-old female for evaluation of congestive heart failure/cardiomyopathy. Previously followed in [place]. Last echocardiogram in July of 2014 showed an ejection fraction of 25-30% with grade 2 diastolic dysfunction. There was mild left atrial enlargement. There was mild mitral regurgitation. Nuclear study in April of 2014 showed normal perfusion. Ejection fraction was 24%. Carotid Dopplers in April 2014 showed less than 39% left carotid stenosis. Chest CT in March of 2014 showed no pulmonary embolus, small to moderate right effusion which was felt to be partially loculated. Apparently patient has had poor compliance with treatment and poor tolerance of treatment based on outside records. Patient states that she has had dyspnea on exertion in the past. However over the past year it has worsened markedly. She has chronic orthopnea. No PND, palpitations or chest pain. Occasional mild pedal edema.  Current Outpatient Prescriptions  Medication Sig Dispense Refill  . aspirin 81 MG tablet Take 81 mg by mouth daily.      . carvedilol (COREG) 25 MG tablet Take 25 mg by mouth 2 (two) times daily with a meal.      . furosemide (LASIX) 20 MG tablet Take 1 tablet (20 mg total) by mouth daily.  15 tablet  0  . glipiZIDE (GLUCOTROL) 5 MG tablet Take 5 mg by mouth daily.      . losartan (COZAAR) 50 MG tablet Take 50 mg by mouth daily.      . metFORMIN (GLUCOPHAGE) 500 MG tablet Take 1,000 mg by mouth 2 (two) times daily with a meal.       . omeprazole (PRILOSEC) 20 MG capsule Take 20 mg by mouth 2 (two) times daily.       No current facility-administered medications for this visit.    No Known Allergies  Past Medical History  Diagnosis Date  . Hypertensive cardiomyopathy     By report there is poor compliance w/treatment, poor tolerance of treatment & poor symptom control. Symptoms include dyspnea & DOE.  . Essential hypertension, benign   . Diabetes mellitus, type 2   . Obesity   .  Hyperlipidemia, mixed   . Unspecified menopausal and postmenopausal disorder   . Bruit     Bilat carotid duplex 09/05/12 CONCLUSION: Mild, less than or equal to 39%, left internal carotid artery stenosis.  . Hypersomnia, unspecified   . CHF (congestive heart failure)     MPI 09/05/12 NORMAL, showed no scar & LVEF is 20-25%. ECHO 12/11/12 LV is markedly dilated, Overall LV systolic function severely impaired with EF = 25-30%, pseudonormal LV filling pattern (consistant with elevated LA pressure).  . PUD (peptic ulcer disease)     History reviewed. No pertinent past surgical history.  History   Social History  . Marital Status: Married    Spouse Name: N/A    Number of Children: 5  . Years of Education: N/A   Occupational History  .      Retired   Social History Main Topics  . Smoking status: Never Smoker   . Smokeless tobacco: Not on file  . Alcohol Use: No  . Drug Use: No  . Sexual Activity: No   Other Topics Concern  . Not on file   Social History Narrative  . No narrative on file    Family History  Problem Relation Age of Onset  . Diabetes Mellitus II Mother   . Cancer Brother     Colon  . Hypertension Father     ROS: no fevers or   chills, productive cough, hemoptysis, dysphasia, odynophagia, melena, hematochezia, dysuria, hematuria, rash, seizure activity, claudication. Remaining systems are negative.  Physical Exam:   Blood pressure 168/90, pulse 83, height 5' 4" (1.626 m), weight 237 lb 1.9 oz (107.557 kg).  General:  Well developed/obese in NAD Skin warm/dry Patient not depressed No peripheral clubbing Back-normal HEENT-normal/normal eyelids Neck supple/normal carotid upstroke bilaterally; no bruits; no JVD; no thyromegaly chest - CTA/ normal expansion CV - RRR/normal S1 and S2; no murmurs, rubs or gallops;  PMI nondisplaced Abdomen -NT/ND, no HSM, no mass, + bowel sounds, no bruit 2+ femoral pulses, no bruits Ext-trace edema, no chords, 2+  DP Neuro-grossly nonfocal  ECG Sinus rhythm, LVH with repolarization abnormality.   

## 2013-02-27 NOTE — Interval H&P Note (Signed)
Cath Lab Visit (complete for each Cath Lab visit)  Clinical Evaluation Leading to the Procedure:   ACS: no  Non-ACS:    Anginal Classification: No Symptoms  Anti-ischemic medical therapy: No Therapy  Non-Invasive Test Results: No non-invasive testing performed  Prior CABG: No previous CABG      History and Physical Interval Note:  02/27/2013 9:57 AM  Anna Beltran  has presented today for surgery, with the diagnosis of Chest pain  The various methods of treatment have been discussed with the patient and family. After consideration of risks, benefits and other options for treatment, the patient has consented to  Procedure(s): LEFT AND RIGHT HEART CATHETERIZATION WITH CORONARY ANGIOGRAM (N/A) as a surgical intervention .  The patient's history has been reviewed, patient examined, no change in status, stable for surgery.  I have reviewed the patient's chart and labs.  Questions were answered to the patient's satisfaction.     Rollene Rotunda

## 2013-03-01 ENCOUNTER — Telehealth: Payer: Self-pay | Admitting: Cardiology

## 2013-03-01 NOTE — Telephone Encounter (Signed)
Spoke with pt, she feels fine. She will not take her metformin tonight then restart it tomorrow. She will call if anything changes.

## 2013-03-01 NOTE — Telephone Encounter (Signed)
New message    Had a procedure tues---took a medication this am that she was not supposed to  take (medformin)

## 2013-04-09 ENCOUNTER — Encounter: Payer: Self-pay | Admitting: Cardiology

## 2013-04-09 ENCOUNTER — Ambulatory Visit (INDEPENDENT_AMBULATORY_CARE_PROVIDER_SITE_OTHER): Payer: Medicare Other | Admitting: Cardiology

## 2013-04-09 VITALS — BP 140/80 | HR 92 | Ht 64.0 in | Wt 234.0 lb

## 2013-04-09 DIAGNOSIS — I2510 Atherosclerotic heart disease of native coronary artery without angina pectoris: Secondary | ICD-10-CM | POA: Insufficient documentation

## 2013-04-09 DIAGNOSIS — I10 Essential (primary) hypertension: Secondary | ICD-10-CM

## 2013-04-09 DIAGNOSIS — I429 Cardiomyopathy, unspecified: Secondary | ICD-10-CM

## 2013-04-09 DIAGNOSIS — I428 Other cardiomyopathies: Secondary | ICD-10-CM

## 2013-04-09 MED ORDER — PRAVASTATIN SODIUM 40 MG PO TABS: 40.0000 mg | ORAL_TABLET | Freq: Every evening | ORAL | Status: DC

## 2013-04-09 MED ORDER — SPIRONOLACTONE 25 MG PO TABS: 25.0000 mg | ORAL_TABLET | Freq: Every day | ORAL | Status: DC

## 2013-04-09 NOTE — Assessment & Plan Note (Signed)
Continue aspirin. Add Pravachol 40 mg daily. Check lipids and liver in 6 weeks.

## 2013-04-09 NOTE — Patient Instructions (Signed)
Your physician recommends that you schedule a follow-up appointment in: 8 WEEKS WITH DR CRENSHAW  START SPIRONOLACTONE 25MG  DAILY & RETURN IN WEEK FOR LABWORK   START PRAVACHOL 40MG  DAILY & RETURN IN 6 WEEKS FOR FASTING LAB WORK

## 2013-04-09 NOTE — Progress Notes (Signed)
HPI: FU congestive heart failure/cardiomyopathy. Previously followed in [facility]. Last echocardiogram in July of 2014 showed an ejection fraction of 25-30% with grade 2 diastolic dysfunction. There was mild left atrial enlargement. There was mild mitral regurgitation. Nuclear study in April of 2014 showed normal perfusion. Ejection fraction was 24%. Carotid Dopplers in April 2014 showed less than 39% left carotid stenosis. Chest CT in March of 2014 showed no pulmonary embolus, small to moderate right effusion which was felt to be partially loculated. Cardiac catheterization in October of 2014 showed nonobstructive coronary disease and an ejection fraction of 30%. Right heart pressures were normal. Apparently patient has had poor compliance with treatment and poor tolerance of treatment based on outside records. Since she was last seen, she continues to have dyspnea on exertion but no orthopnea, PND, pedal edema, chest pain or syncope.   Current Outpatient Prescriptions  Medication Sig Dispense Refill  . aspirin 81 MG tablet Take 81 mg by mouth daily.      . carvedilol (COREG) 25 MG tablet Take 25 mg by mouth 2 (two) times daily with a meal.      . furosemide (LASIX) 20 MG tablet Take 1 tablet (20 mg total) by mouth daily.  15 tablet  0  . glipiZIDE (GLUCOTROL) 5 MG tablet Take 2.5 mg by mouth daily.       Marland Kitchen losartan (COZAAR) 100 MG tablet Take 1 tablet (100 mg total) by mouth daily.  30 tablet  12  . metFORMIN (GLUCOPHAGE) 500 MG tablet Take 1,000 mg by mouth 2 (two) times daily with a meal.       . omeprazole (PRILOSEC) 20 MG capsule Take 20 mg by mouth 2 (two) times daily.      . Polyvinyl Alcohol-Povidone (REFRESH OP) Place 1 drop into both eyes 2 (two) times daily between meals as needed (for dry eyes).       No current facility-administered medications for this visit.     Past Medical History  Diagnosis Date  . Hypertensive cardiomyopathy     By report there is poor compliance  w/treatment, poor tolerance of treatment & poor symptom control. Symptoms include dyspnea & DOE.  . Essential hypertension, benign   . Diabetes mellitus, type 2   . Obesity   . Hyperlipidemia, mixed   . Unspecified menopausal and postmenopausal disorder   . Bruit     Bilat carotid duplex 09/05/12 CONCLUSION: Mild, less than or equal to 39%, left internal carotid artery stenosis.  . Hypersomnia, unspecified   . CHF (congestive heart failure)     MPI 09/05/12 NORMAL, showed no scar & LVEF is 20-25%. ECHO 12/11/12 LV is markedly dilated, Overall LV systolic function severely impaired with EF = 25-30%, pseudonormal LV filling pattern (consistant with elevated LA pressure).  . PUD (peptic ulcer disease)     History reviewed. No pertinent past surgical history.  History   Social History  . Marital Status: Married    Spouse Name: N/A    Number of Children: 5  . Years of Education: N/A   Occupational History  .      Retired   Social History Main Topics  . Smoking status: Never Smoker   . Smokeless tobacco: Not on file  . Alcohol Use: No  . Drug Use: No  . Sexual Activity: No   Other Topics Concern  . Not on file   Social History Narrative  . No narrative on file    ROS:  no fevers or chills, productive cough, hemoptysis, dysphasia, odynophagia, melena, hematochezia, dysuria, hematuria, rash, seizure activity, orthopnea, PND, pedal edema, claudication. Remaining systems are negative.  Physical Exam: Well-developed well-nourished in no acute distress.  Skin is warm and dry.  HEENT is normal.  Neck is supple.  Chest is clear to auscultation with normal expansion.  Cardiovascular exam is regular rate and rhythm.  Abdominal exam nontender or distended. No masses palpated. Extremities show no edema. neuro grossly intact  ECG

## 2013-04-09 NOTE — Assessment & Plan Note (Signed)
Blood pressure is improving. Add spironolactone as outlined under CHF. If blood pressure remains elevated we'll add Norvasc versus hydralazine nitrates.

## 2013-04-09 NOTE — Assessment & Plan Note (Signed)
Continue present dose of Lasix. Add spironolactone 25 mg daily. Check potassium and renal function in one week.

## 2013-04-09 NOTE — Assessment & Plan Note (Signed)
Patient has a nonischemic cardiomyopathy most likely from hypertension. Continue ACE inhibitor and beta blocker. Hopefully LV function will improve with control of blood pressure. Once medications have been titrated we will repeat echocardiogram. His ejection fraction less than 35% we'll need to consider ICD.

## 2013-04-16 ENCOUNTER — Other Ambulatory Visit: Payer: Medicare Other

## 2013-04-16 DIAGNOSIS — I429 Cardiomyopathy, unspecified: Secondary | ICD-10-CM

## 2013-04-16 DIAGNOSIS — I10 Essential (primary) hypertension: Secondary | ICD-10-CM

## 2013-04-20 ENCOUNTER — Other Ambulatory Visit: Payer: Medicare Other

## 2013-04-23 ENCOUNTER — Ambulatory Visit: Payer: Medicare Other | Admitting: *Deleted

## 2013-04-23 DIAGNOSIS — I420 Dilated cardiomyopathy: Secondary | ICD-10-CM

## 2013-04-23 DIAGNOSIS — I10 Essential (primary) hypertension: Secondary | ICD-10-CM

## 2013-04-23 DIAGNOSIS — I2510 Atherosclerotic heart disease of native coronary artery without angina pectoris: Secondary | ICD-10-CM

## 2013-04-23 DIAGNOSIS — I509 Heart failure, unspecified: Secondary | ICD-10-CM

## 2013-05-04 ENCOUNTER — Other Ambulatory Visit: Payer: Self-pay | Admitting: *Deleted

## 2013-05-04 ENCOUNTER — Ambulatory Visit (INDEPENDENT_AMBULATORY_CARE_PROVIDER_SITE_OTHER): Payer: Medicare Other | Admitting: *Deleted

## 2013-05-04 DIAGNOSIS — I509 Heart failure, unspecified: Secondary | ICD-10-CM

## 2013-05-04 DIAGNOSIS — I10 Essential (primary) hypertension: Secondary | ICD-10-CM

## 2013-05-04 DIAGNOSIS — I420 Dilated cardiomyopathy: Secondary | ICD-10-CM

## 2013-05-04 DIAGNOSIS — I2510 Atherosclerotic heart disease of native coronary artery without angina pectoris: Secondary | ICD-10-CM

## 2013-05-04 LAB — BASIC METABOLIC PANEL
BUN: 15 mg/dL (ref 6–23)
Calcium: 10 mg/dL (ref 8.4–10.5)
Creatinine, Ser: 0.7 mg/dL (ref 0.4–1.2)
GFR: 101.5 mL/min (ref >60.00)
Glucose, Bld: 126 mg/dL — ABNORMAL HIGH (ref 70–99)

## 2013-05-21 ENCOUNTER — Other Ambulatory Visit: Payer: Medicare Other

## 2013-06-05 ENCOUNTER — Ambulatory Visit: Payer: Medicare Other | Admitting: Cardiology

## 2013-06-07 ENCOUNTER — Ambulatory Visit: Payer: Medicare Other | Admitting: Cardiology

## 2013-07-10 ENCOUNTER — Ambulatory Visit: Payer: Medicare Other | Admitting: Cardiology

## 2013-07-30 ENCOUNTER — Encounter: Payer: Self-pay | Admitting: Cardiology

## 2013-07-30 ENCOUNTER — Ambulatory Visit (INDEPENDENT_AMBULATORY_CARE_PROVIDER_SITE_OTHER): Payer: Medicare Other | Admitting: Cardiology

## 2013-07-30 VITALS — BP 160/100 | HR 83 | Ht 64.0 in | Wt 245.0 lb

## 2013-07-30 DIAGNOSIS — I10 Essential (primary) hypertension: Secondary | ICD-10-CM

## 2013-07-30 DIAGNOSIS — I428 Other cardiomyopathies: Secondary | ICD-10-CM

## 2013-07-30 DIAGNOSIS — I420 Dilated cardiomyopathy: Secondary | ICD-10-CM

## 2013-07-30 DIAGNOSIS — R0602 Shortness of breath: Secondary | ICD-10-CM

## 2013-07-30 DIAGNOSIS — I509 Heart failure, unspecified: Secondary | ICD-10-CM

## 2013-07-30 DIAGNOSIS — I2510 Atherosclerotic heart disease of native coronary artery without angina pectoris: Secondary | ICD-10-CM

## 2013-07-30 LAB — BASIC METABOLIC PANEL
BUN: 19 mg/dL (ref 6–23)
CHLORIDE: 105 meq/L (ref 96–112)
CO2: 25 meq/L (ref 19–32)
Calcium: 10.6 mg/dL — ABNORMAL HIGH (ref 8.4–10.5)
Creatinine, Ser: 0.8 mg/dL (ref 0.4–1.2)
GFR: 91.38 mL/min (ref >60.00)
GLUCOSE: 108 mg/dL — AB (ref 70–99)
Potassium: 3.7 mEq/L (ref 3.5–5.1)
Sodium: 139 mEq/L (ref 135–145)

## 2013-07-30 LAB — BRAIN NATRIURETIC PEPTIDE: Pro B Natriuretic peptide (BNP): 13 pg/mL (ref 0.0–100.0)

## 2013-07-30 MED ORDER — HYDRALAZINE HCL 25 MG PO TABS: 25.0000 mg | ORAL_TABLET | Freq: Three times a day (TID) | ORAL | Status: DC

## 2013-07-30 MED ORDER — ISOSORBIDE MONONITRATE ER 30 MG PO TB24: 30.0000 mg | ORAL_TABLET | Freq: Every day | ORAL | Status: DC

## 2013-07-30 NOTE — Assessment & Plan Note (Signed)
Possibly hypertensive mediated. Continue ARB and beta blocker. Add hydralazine/nitrate. Once medications titrated repeat echocardiogram. His ejection fraction less than 35% would need ICD.

## 2013-07-30 NOTE — Assessment & Plan Note (Signed)
Continue present dose of diuretics. Check potassium, renal function and BNP. 

## 2013-07-30 NOTE — Patient Instructions (Signed)
Your physician recommends that you schedule a follow-up appointment in: 8 WEEKS WITH DR CRENSHAW  START HYDRALAZINE 25 MG ONE TABLET THREE TIMES DAILY  START ISOSORBIDE 30 MG ONCE DAILY  Your physician recommends that you HAVE LAB WORK TODAY

## 2013-07-30 NOTE — Assessment & Plan Note (Signed)
Continue aspirin and statin. 

## 2013-07-30 NOTE — Progress Notes (Signed)
HPI: FU congestive heart failure/cardiomyopathy. Previously followed in [facility]. Last echocardiogram in July of 2014 showed an ejection fraction of 25-30% with grade 2 diastolic dysfunction. There was mild left atrial enlargement. There was mild mitral regurgitation. Nuclear study in April of 2014 showed normal perfusion. Ejection fraction was 24%. Carotid Dopplers in April 2014 showed less than 39% left carotid stenosis. Chest CT in March of 2014 showed no pulmonary embolus, small to moderate right effusion which was felt to be partially loculated. Cardiac catheterization in October of 2014 showed nonobstructive coronary disease and an ejection fraction of 30%. Right heart pressures were normal. Apparently patient has had poor compliance with treatment and poor tolerance of treatment based on outside records. Since she was last seen, she continues to have dyspnea on exertion but no orthopnea, PND, pedal edema, chest pain or syncope.   Current Outpatient Prescriptions  Medication Sig Dispense Refill  . aspirin 81 MG tablet Take 81 mg by mouth daily.      . carvedilol (COREG) 25 MG tablet Take 25 mg by mouth 2 (two) times daily with a meal.      . furosemide (LASIX) 20 MG tablet Take 1 tablet (20 mg total) by mouth daily.  15 tablet  0  . glipiZIDE (GLUCOTROL) 5 MG tablet Take 2.5 mg by mouth daily.       Marland Kitchen losartan (COZAAR) 100 MG tablet Take 1 tablet (100 mg total) by mouth daily.  30 tablet  12  . metFORMIN (GLUCOPHAGE) 500 MG tablet Take 1,000 mg by mouth 2 (two) times daily with a meal.       . omeprazole (PRILOSEC) 20 MG capsule Take 20 mg by mouth 2 (two) times daily.      . Polyvinyl Alcohol-Povidone (REFRESH OP) Place 1 drop into both eyes 2 (two) times daily between meals as needed (for dry eyes).      . pravastatin (PRAVACHOL) 40 MG tablet Take 1 tablet (40 mg total) by mouth every evening.  90 tablet  3  . spironolactone (ALDACTONE) 25 MG tablet Take 1 tablet (25 mg total) by  mouth daily.  90 tablet  3   No current facility-administered medications for this visit.     Past Medical History  Diagnosis Date  . Hypertensive cardiomyopathy     By report there is poor compliance w/treatment, poor tolerance of treatment & poor symptom control. Symptoms include dyspnea & DOE.  . Essential hypertension, benign   . Diabetes mellitus, type 2   . Obesity   . Hyperlipidemia, mixed   . Unspecified menopausal and postmenopausal disorder   . Bruit     Bilat carotid duplex 09/05/12 CONCLUSION: Mild, less than or equal to 39%, left internal carotid artery stenosis.  . Hypersomnia, unspecified   . CHF (congestive heart failure)     MPI 09/05/12 NORMAL, showed no scar & LVEF is 20-25%. ECHO 12/11/12 LV is markedly dilated, Overall LV systolic function severely impaired with EF = 25-30%, pseudonormal LV filling pattern (consistant with elevated LA pressure).  . PUD (peptic ulcer disease)     History reviewed. No pertinent past surgical history.  History   Social History  . Marital Status: Married    Spouse Name: N/A    Number of Children: 5  . Years of Education: N/A   Occupational History  .      Retired   Social History Main Topics  . Smoking status: Never Smoker   . Smokeless tobacco:  Not on file  . Alcohol Use: No  . Drug Use: No  . Sexual Activity: No   Other Topics Concern  . Not on file   Social History Narrative  . No narrative on file    ROS: no fevers or chills, productive cough, hemoptysis, dysphasia, odynophagia, melena, hematochezia, dysuria, hematuria, rash, seizure activity, orthopnea, PND, pedal edema, claudication. Remaining systems are negative.  Physical Exam: Well-developed obese in no acute distress.  Skin is warm and dry.  HEENT is normal.  Neck is supple.  Chest is clear to auscultation with normal expansion.  Cardiovascular exam is regular rate and rhythm.  Abdominal exam nontender or distended. No masses palpated. Extremities  show trace edema. neuro grossly intact  ECG sinus rhythm at a rate of 83. Left ventricular hypertrophy with repolarization changes.

## 2013-07-30 NOTE — Assessment & Plan Note (Signed)
Blood pressure mildly elevated but she has not taken her medications today. She states it is better at home. Add hydralazine 25 mg by mouth 3 times a day and Imdur 30 mg daily. Track blood pressure and increase medications as needed.

## 2013-09-20 ENCOUNTER — Ambulatory Visit (INDEPENDENT_AMBULATORY_CARE_PROVIDER_SITE_OTHER): Payer: Medicare Other | Admitting: Cardiology

## 2013-09-20 ENCOUNTER — Encounter: Payer: Self-pay | Admitting: *Deleted

## 2013-09-20 ENCOUNTER — Encounter: Payer: Self-pay | Admitting: Cardiology

## 2013-09-20 VITALS — BP 144/82 | HR 73 | Ht 64.0 in | Wt 244.0 lb

## 2013-09-20 DIAGNOSIS — I428 Other cardiomyopathies: Secondary | ICD-10-CM

## 2013-09-20 DIAGNOSIS — I420 Dilated cardiomyopathy: Secondary | ICD-10-CM

## 2013-09-20 DIAGNOSIS — R0602 Shortness of breath: Secondary | ICD-10-CM

## 2013-09-20 DIAGNOSIS — I509 Heart failure, unspecified: Secondary | ICD-10-CM

## 2013-09-20 DIAGNOSIS — I2510 Atherosclerotic heart disease of native coronary artery without angina pectoris: Secondary | ICD-10-CM

## 2013-09-20 MED ORDER — HYDRALAZINE HCL 50 MG PO TABS: 50.0000 mg | ORAL_TABLET | Freq: Three times a day (TID) | ORAL | Status: DC

## 2013-09-20 NOTE — Assessment & Plan Note (Signed)
Continue aspirin and statin. 

## 2013-09-20 NOTE — Assessment & Plan Note (Signed)
Cardiomyopathy most likely related to hypertension. Plan continue beta blocker, ARB, hydralazine and nitrates. Continue present dose of diuretics. Increase hydralazine to 50 mg by mouth 3 times a day. Repeat echocardiogram in 8 weeks. His ejection fraction less than 35% now the blood pressure controlled patient would require ICD.

## 2013-09-20 NOTE — Patient Instructions (Signed)
Your physician recommends that you schedule a follow-up appointment in: 12 WEEKS WITH DR CRENSHAW  INCREASE HYDRALAZINE TO 5-0 MG THREE TIMES DAILY= 2 OF THE 25 MG TABLETS THREE TIMES DAILY  Your physician has requested that you have an echocardiogram. Echocardiography is a painless test that uses sound waves to create images of your heart. It provides your doctor with information about the size and shape of your heart and how well your heart's chambers and valves are working. This procedure takes approximately one hour. There are no restrictions for this procedure. SCHEDULE IN 8 WEEKS

## 2013-09-20 NOTE — Progress Notes (Signed)
HPI: FU congestive heart failure/cardiomyopathy. Previously followed in [facility]. Last echocardiogram in July of 2014 showed an ejection fraction of 25-30% with grade 2 diastolic dysfunction. There was mild left atrial enlargement. There was mild mitral regurgitation. Nuclear study in April of 2014 showed normal perfusion. Ejection fraction was 24%. Carotid Dopplers in April 2014 showed less than 39% left carotid stenosis. Chest CT in March of 2014 showed no pulmonary embolus, small to moderate right effusion which was felt to be partially loculated. Cardiac catheterization in October of 2014 showed nonobstructive coronary disease and an ejection fraction of 30%. Right heart pressures were normal. Apparently patient has had poor compliance with treatment and poor tolerance of treatment based on outside records. Since she was last seen in Nov 2014, she continues to have dyspnea on exertion but no orthopnea, PND, chest pain or syncope. Mild pedal edema   Current Outpatient Prescriptions  Medication Sig Dispense Refill  . aspirin 81 MG tablet Take 81 mg by mouth daily.      . carvedilol (COREG) 25 MG tablet Take 25 mg by mouth 2 (two) times daily with a meal.      . furosemide (LASIX) 20 MG tablet Take 1 tablet (20 mg total) by mouth daily.  15 tablet  0  . glipiZIDE (GLUCOTROL) 5 MG tablet Take 2.5 mg by mouth daily.       . hydrALAZINE (APRESOLINE) 25 MG tablet Take 1 tablet (25 mg total) by mouth 3 (three) times daily.  270 tablet  3  . isosorbide mononitrate (IMDUR) 30 MG 24 hr tablet Take 15 mg by mouth daily.      Marland Kitchen losartan (COZAAR) 100 MG tablet Take 1 tablet (100 mg total) by mouth daily.  30 tablet  12  . metFORMIN (GLUCOPHAGE) 500 MG tablet Take 1,000 mg by mouth 2 (two) times daily with a meal.       . omeprazole (PRILOSEC) 20 MG capsule Take 20 mg by mouth 2 (two) times daily.      . Polyvinyl Alcohol-Povidone (REFRESH OP) Place 1 drop into both eyes 2 (two) times daily between  meals as needed (for dry eyes).      . pravastatin (PRAVACHOL) 40 MG tablet Take 1 tablet (40 mg total) by mouth every evening.  90 tablet  3  . spironolactone (ALDACTONE) 25 MG tablet Take 1 tablet (25 mg total) by mouth daily.  90 tablet  3   No current facility-administered medications for this visit.     Past Medical History  Diagnosis Date  . Hypertensive cardiomyopathy     By report there is poor compliance w/treatment, poor tolerance of treatment & poor symptom control. Symptoms include dyspnea & DOE.  . Essential hypertension, benign   . Diabetes mellitus, type 2   . Obesity   . Hyperlipidemia, mixed   . Unspecified menopausal and postmenopausal disorder   . Bruit     Bilat carotid duplex 09/05/12 CONCLUSION: Mild, less than or equal to 39%, left internal carotid artery stenosis.  . Hypersomnia, unspecified   . CHF (congestive heart failure)     MPI 09/05/12 NORMAL, showed no scar & LVEF is 20-25%. ECHO 12/11/12 LV is markedly dilated, Overall LV systolic function severely impaired with EF = 25-30%, pseudonormal LV filling pattern (consistant with elevated LA pressure).  . PUD (peptic ulcer disease)     History reviewed. No pertinent past surgical history.  History   Social History  . Marital Status: Married  Spouse Name: N/A    Number of Children: 5  . Years of Education: N/A   Occupational History  .      Retired   Social History Main Topics  . Smoking status: Never Smoker   . Smokeless tobacco: Not on file  . Alcohol Use: No  . Drug Use: No  . Sexual Activity: No   Other Topics Concern  . Not on file   Social History Narrative  . No narrative on file    ROS: no fevers or chills, productive cough, hemoptysis, dysphasia, odynophagia, melena, hematochezia, dysuria, hematuria, rash, seizure activity, orthopnea, PND, claudication. Remaining systems are negative.  Physical Exam: Well-developed well-nourished in no acute distress.  Skin is warm and dry.    HEENT is normal.  Neck is supple.  Chest is clear to auscultation with normal expansion.  Cardiovascular exam is regular rate and rhythm.  Abdominal exam nontender or distended. No masses palpated. Extremities show Trace edema. neuro grossly intact  ECG Sinus rhythm at a rate of 73. Left ventricular hypertrophy with repolarization abnormality. Left axis deviation.

## 2013-09-20 NOTE — Assessment & Plan Note (Signed)
Blood pressure improved but remains borderline. Increase hydralazine to 50 mg by mouth 3 times a day.

## 2013-09-20 NOTE — Assessment & Plan Note (Signed)
Euvolemic.Continue present dose of diuretics.

## 2013-10-09 ENCOUNTER — Other Ambulatory Visit: Payer: Self-pay | Admitting: *Deleted

## 2013-10-09 MED ORDER — FUROSEMIDE 20 MG PO TABS: 20.0000 mg | ORAL_TABLET | Freq: Every day | ORAL | Status: DC

## 2013-10-09 MED ORDER — CARVEDILOL 25 MG PO TABS: 25.0000 mg | ORAL_TABLET | Freq: Two times a day (BID) | ORAL | Status: DC

## 2013-11-10 ENCOUNTER — Emergency Department (HOSPITAL_COMMUNITY)
Admission: EM | Admit: 2013-11-10 | Discharge: 2013-11-10 | Disposition: A | Discharge status: Home / Self Care | Payer: Medicare Other | Attending: Emergency Medicine | Admitting: Emergency Medicine

## 2013-11-10 ENCOUNTER — Encounter (HOSPITAL_COMMUNITY): Payer: Self-pay | Admitting: Emergency Medicine

## 2013-11-10 DIAGNOSIS — Z8742 Personal history of other diseases of the female genital tract: Secondary | ICD-10-CM | POA: Insufficient documentation

## 2013-11-10 DIAGNOSIS — Z8719 Personal history of other diseases of the digestive system: Secondary | ICD-10-CM | POA: Insufficient documentation

## 2013-11-10 DIAGNOSIS — E119 Type 2 diabetes mellitus without complications: Secondary | ICD-10-CM | POA: Insufficient documentation

## 2013-11-10 DIAGNOSIS — R112 Nausea with vomiting, unspecified: Secondary | ICD-10-CM

## 2013-11-10 DIAGNOSIS — Z7982 Long term (current) use of aspirin: Secondary | ICD-10-CM | POA: Insufficient documentation

## 2013-11-10 DIAGNOSIS — I509 Heart failure, unspecified: Secondary | ICD-10-CM | POA: Insufficient documentation

## 2013-11-10 DIAGNOSIS — I10 Essential (primary) hypertension: Secondary | ICD-10-CM | POA: Insufficient documentation

## 2013-11-10 DIAGNOSIS — N390 Urinary tract infection, site not specified: Secondary | ICD-10-CM

## 2013-11-10 DIAGNOSIS — E782 Mixed hyperlipidemia: Secondary | ICD-10-CM | POA: Insufficient documentation

## 2013-11-10 DIAGNOSIS — Z79899 Other long term (current) drug therapy: Secondary | ICD-10-CM | POA: Insufficient documentation

## 2013-11-10 DIAGNOSIS — E669 Obesity, unspecified: Secondary | ICD-10-CM | POA: Insufficient documentation

## 2013-11-10 LAB — CBC WITH DIFFERENTIAL/PLATELET
Basophils Absolute: 0 K/uL (ref 0.0–0.1)
Basophils Relative: 0 % (ref 0–1)
Eosinophils Absolute: 0 K/uL (ref 0.0–0.7)
Eosinophils Relative: 0 % (ref 0–5)
HCT: 38.9 % (ref 36.0–46.0)
Hemoglobin: 12.9 g/dL (ref 12.0–15.0)
Lymphocytes Relative: 15 % (ref 12–46)
Lymphs Abs: 1.5 K/uL (ref 0.7–4.0)
MCH: 29.9 pg (ref 26.0–34.0)
MCHC: 33.2 g/dL (ref 30.0–36.0)
MCV: 90.3 fL (ref 78.0–100.0)
Monocytes Absolute: 0.8 K/uL (ref 0.1–1.0)
Monocytes Relative: 8 % (ref 3–12)
Neutro Abs: 7.9 K/uL — ABNORMAL HIGH (ref 1.7–7.7)
Neutrophils Relative %: 77 % (ref 43–77)
Platelets: 176 K/uL (ref 150–400)
RBC: 4.31 MIL/uL (ref 3.87–5.11)
RDW: 14.3 % (ref 11.5–15.5)
WBC: 10.2 K/uL (ref 4.0–10.5)

## 2013-11-10 LAB — COMPREHENSIVE METABOLIC PANEL
ALK PHOS: 116 U/L (ref 39–117)
ALT: 87 U/L — AB (ref 0–35)
AST: 20 U/L (ref 0–37)
Albumin: 3.3 g/dL — ABNORMAL LOW (ref 3.5–5.2)
BUN: 14 mg/dL (ref 6–23)
CO2: 26 mEq/L (ref 19–32)
Calcium: 10 mg/dL (ref 8.4–10.5)
Chloride: 98 mEq/L (ref 96–112)
Creatinine, Ser: 1.04 mg/dL (ref 0.50–1.10)
GFR calc non Af Amer: 56 mL/min — ABNORMAL LOW (ref >90)
GFR, EST AFRICAN AMERICAN: 64 mL/min — AB (ref >90)
GLUCOSE: 240 mg/dL — AB (ref 70–99)
POTASSIUM: 3.9 meq/L (ref 3.7–5.3)
Sodium: 138 mEq/L (ref 137–147)
TOTAL PROTEIN: 7.7 g/dL (ref 6.0–8.3)
Total Bilirubin: 0.9 mg/dL (ref 0.3–1.2)

## 2013-11-10 LAB — URINE MICROSCOPIC-ADD ON

## 2013-11-10 LAB — URINALYSIS, ROUTINE W REFLEX MICROSCOPIC
Bilirubin Urine: NEGATIVE
Glucose, UA: 100 mg/dL — AB
Ketones, ur: 15 mg/dL — AB
Nitrite: NEGATIVE
Protein, ur: 100 mg/dL — AB
Specific Gravity, Urine: 1.022 (ref 1.005–1.030)
Urobilinogen, UA: 1 mg/dL (ref 0.0–1.0)
pH: 6 (ref 5.0–8.0)

## 2013-11-10 MED ORDER — ONDANSETRON HCL 4 MG/2ML IJ SOLN
4.0000 mg | Freq: Once | INTRAMUSCULAR | Status: AC
  Administered 2013-11-10: 4 mg via INTRAVENOUS
  Filled 2013-11-10: qty 2

## 2013-11-10 MED ORDER — ONDANSETRON 4 MG PO TBDP: 4.0000 mg | ORAL_TABLET | Freq: Three times a day (TID) | ORAL | Status: DC | PRN

## 2013-11-10 MED ORDER — DEXTROSE 5 % IV SOLN
1.0000 g | Freq: Once | INTRAVENOUS | Status: AC
  Administered 2013-11-10: 1 g via INTRAVENOUS
  Filled 2013-11-10: qty 10

## 2013-11-10 MED ORDER — PANTOPRAZOLE SODIUM 40 MG IV SOLR
40.0000 mg | Freq: Once | INTRAVENOUS | Status: AC
  Administered 2013-11-10: 40 mg via INTRAVENOUS
  Filled 2013-11-10: qty 40

## 2013-11-10 MED ORDER — CEPHALEXIN 500 MG PO CAPS: 500.0000 mg | ORAL_CAPSULE | Freq: Three times a day (TID) | ORAL | Status: DC

## 2013-11-10 MED ORDER — SODIUM CHLORIDE 0.9 % IV BOLUS (SEPSIS)
500.0000 mL | Freq: Once | INTRAVENOUS | Status: AC
  Administered 2013-11-10: 500 mL via INTRAVENOUS

## 2013-11-10 NOTE — ED Notes (Signed)
Pt. reports nausea and emesis onset 3 days ago , pt. stated history of PUD , denies diarrhea/ no fever or chills.

## 2013-11-10 NOTE — ED Provider Notes (Signed)
CSN: [id number]     Arrival date & time 11/10/13  0520 History   First MD Initiated Contact with Patient 11/10/13 [phone]     Chief Complaint  Patient presents with  . Emesis     (Consider location/radiation/quality/duration/timing/severity/associated sxs/prior Treatment) HPI Comments: Patient presents today with a chief complaint of nausea and vomiting.  She states that her symptoms began yesterday afternoon.  She reports that she has had a total of two episodes of vomiting.  She denies diarrhea.  She also is complaining of pain across her lower abdomen and also some pain of the left flank area.  She describes the pain as a pressure.  She states that the pain has been present for the past 3 days and has been constant.  She is also complaining of some increased urinary frequency, urgency, and states that her urine has an odor and is darker in color.  She states that she has had a UTI in the past and that her symptoms today feel similar.  She denies diarrhea.  She states that her last BM was two days ago and was normal.  She denies chest pain, SOB, fever, or chills.    The history is provided by the patient.    Past Medical History  Diagnosis Date  . Hypertensive cardiomyopathy     By report there is poor compliance w/treatment, poor tolerance of treatment & poor symptom control. Symptoms include dyspnea & DOE.  . Essential hypertension, benign   . Diabetes mellitus, type 2   . Obesity   . Hyperlipidemia, mixed   . Unspecified menopausal and postmenopausal disorder   . Bruit     Bilat carotid duplex 09/05/12 CONCLUSION: Mild, less than or equal to 39%, left internal carotid artery stenosis.  . Hypersomnia, unspecified   . CHF (congestive heart failure)     MPI 09/05/12 NORMAL, showed no scar & LVEF is 20-25%. ECHO 12/11/12 LV is markedly dilated, Overall LV systolic function severely impaired with EF = 25-30%, pseudonormal LV filling pattern (consistant with elevated LA pressure).  . PUD  (peptic ulcer disease)    History reviewed. No pertinent past surgical history. Family History  Problem Relation Age of Onset  . Diabetes Mellitus II Mother   . Cancer Brother     Colon  . Hypertension Father    History  Substance Use Topics  . Smoking status: Never Smoker   . Smokeless tobacco: Not on file  . Alcohol Use: No   OB History   Grav Para Term Preterm Abortions TAB SAB Ect Mult Living                 Review of Systems  All other systems reviewed and are negative.     Allergies  Review of patient's allergies indicates no known allergies.  Home Medications   Prior to Admission medications   Medication Sig Start Date End Date Taking? Authorizing Provider  aspirin 81 MG tablet Take 81 mg by mouth daily.    Historical Provider, MD  carvedilol (COREG) 25 MG tablet Take 1 tablet (25 mg total) by mouth 2 (two) times daily with a meal. 10/09/13   Lewayne Bunting, MD  furosemide (LASIX) 20 MG tablet Take 1 tablet (20 mg total) by mouth daily. 10/09/13   Lewayne Bunting, MD  glipiZIDE (GLUCOTROL) 5 MG tablet Take 2.5 mg by mouth daily.     Historical Provider, MD  hydrALAZINE (APRESOLINE) 50 MG tablet Take 1 tablet (50 mg total) by  mouth 3 (three) times daily. 09/20/13   Lewayne BuntingBrian S Crenshaw, MD  isosorbide mononitrate (IMDUR) 30 MG 24 hr tablet Take 15 mg by mouth daily. 07/30/13   Lewayne BuntingBrian S Crenshaw, MD  losartan (COZAAR) 100 MG tablet Take 1 tablet (100 mg total) by mouth daily. 02/21/13   Lewayne BuntingBrian S Crenshaw, MD  metFORMIN (GLUCOPHAGE) 500 MG tablet Take 1,000 mg by mouth 2 (two) times daily with a meal.     Historical Provider, MD  omeprazole (PRILOSEC) 20 MG capsule Take 20 mg by mouth 2 (two) times daily.    Historical Provider, MD  Polyvinyl Alcohol-Povidone (REFRESH OP) Place 1 drop into both eyes 2 (two) times daily between meals as needed (for dry eyes).    Historical Provider, MD  pravastatin (PRAVACHOL) 40 MG tablet Take 1 tablet (40 mg total) by mouth every evening.  04/09/13   Lewayne BuntingBrian S Crenshaw, MD  spironolactone (ALDACTONE) 25 MG tablet Take 1 tablet (25 mg total) by mouth daily. 04/09/13   Lewayne BuntingBrian S Crenshaw, MD   BP 147/72  Pulse 89  Temp(Src) 97.9 F (36.6 C) (Oral)  Resp 24  SpO2 95% Physical Exam  Nursing note and vitals reviewed. Constitutional: She appears well-developed and well-nourished. No distress.  HENT:  Head: Normocephalic and atraumatic.  Mouth/Throat: Oropharynx is clear and moist.  Neck: Normal range of motion. Neck supple.  Cardiovascular: Normal rate, regular rhythm and normal heart sounds.   Pulmonary/Chest: Effort normal and breath sounds normal.  Abdominal: Soft. Bowel sounds are normal. She exhibits no distension and no mass. There is no tenderness. There is no rebound and no guarding.  Musculoskeletal: Normal range of motion.  Neurological: She is alert.  Skin: Skin is warm and dry. She is not diaphoretic.  Psychiatric: She has a normal mood and affect.    ED Course  Procedures (including critical care time) Labs Review Labs Reviewed  CBC WITH DIFFERENTIAL - Abnormal; Notable for the following:    Neutro Abs 7.9 (*)    All other components within normal limits  COMPREHENSIVE METABOLIC PANEL - Abnormal; Notable for the following:    Glucose, Bld 240 (*)    Albumin 3.3 (*)    ALT 87 (*)    GFR calc non Af Amer 56 (*)    GFR calc Af Amer 64 (*)    All other components within normal limits  URINALYSIS, ROUTINE W REFLEX MICROSCOPIC    Imaging Review No results found.   EKG Interpretation None     7:18 AM Patient reports that her nausea has improved at this time.  She denies any pain at this time.  Abdomen soft and non tender.  Patient tolerating PO liquids.   MDM   Final diagnoses:  None   Patient presenting with lower abdominal pressure, flank pain, urinary symptoms, nausea, and vomiting.  Labs unremarkable.  UA showing a UTI.  Urine sent for culture.  Patient given IV Rocephin and Zofran in the ED.   Symptoms improved during ED course.  Patient stable for discharge.  Patient discharged home with Rx for Keflex.  Return precautions given.    Santiago GladHeather Laisure, PA-C 11/12/13 2259

## 2013-11-11 LAB — URINE CULTURE

## 2013-11-12 ENCOUNTER — Other Ambulatory Visit (HOSPITAL_COMMUNITY): Payer: Self-pay | Admitting: Cardiology

## 2013-11-12 ENCOUNTER — Ambulatory Visit (HOSPITAL_COMMUNITY): Payer: Medicare Other | Attending: Cardiovascular Disease | Admitting: Cardiology

## 2013-11-12 ENCOUNTER — Other Ambulatory Visit (HOSPITAL_COMMUNITY): Payer: Medicare Other

## 2013-11-12 DIAGNOSIS — I509 Heart failure, unspecified: Secondary | ICD-10-CM

## 2013-11-12 DIAGNOSIS — I2510 Atherosclerotic heart disease of native coronary artery without angina pectoris: Secondary | ICD-10-CM | POA: Diagnosis not present

## 2013-11-12 DIAGNOSIS — R0602 Shortness of breath: Secondary | ICD-10-CM

## 2013-11-12 DIAGNOSIS — I428 Other cardiomyopathies: Secondary | ICD-10-CM | POA: Insufficient documentation

## 2013-11-12 DIAGNOSIS — I420 Dilated cardiomyopathy: Secondary | ICD-10-CM

## 2013-11-12 NOTE — Progress Notes (Signed)
Echo performed. 

## 2013-11-13 NOTE — ED Provider Notes (Signed)
Medical screening examination/treatment/procedure(s) were performed by non-physician practitioner and as supervising physician I was immediately available for consultation/collaboration.   EKG Interpretation None        Enid Skeens, MD 11/13/13 [phone]

## 2013-12-11 ENCOUNTER — Encounter: Payer: Self-pay | Admitting: Cardiology

## 2013-12-11 ENCOUNTER — Ambulatory Visit (INDEPENDENT_AMBULATORY_CARE_PROVIDER_SITE_OTHER): Payer: Medicare Other | Admitting: Cardiology

## 2013-12-11 VITALS — BP 112/50 | HR 70 | Ht 64.0 in | Wt 239.0 lb

## 2013-12-11 DIAGNOSIS — I428 Other cardiomyopathies: Secondary | ICD-10-CM

## 2013-12-11 DIAGNOSIS — I509 Heart failure, unspecified: Secondary | ICD-10-CM

## 2013-12-11 DIAGNOSIS — I2510 Atherosclerotic heart disease of native coronary artery without angina pectoris: Secondary | ICD-10-CM

## 2013-12-11 DIAGNOSIS — I2584 Coronary atherosclerosis due to calcified coronary lesion: Secondary | ICD-10-CM

## 2013-12-11 DIAGNOSIS — I10 Essential (primary) hypertension: Secondary | ICD-10-CM

## 2013-12-11 DIAGNOSIS — I5022 Chronic systolic (congestive) heart failure: Secondary | ICD-10-CM

## 2013-12-11 DIAGNOSIS — I420 Dilated cardiomyopathy: Secondary | ICD-10-CM

## 2013-12-11 NOTE — Assessment & Plan Note (Signed)
Symptoms have improved. Continue present dose of diuretics.

## 2013-12-11 NOTE — Assessment & Plan Note (Signed)
Continue ARB, beta blocker, hydralazine/nitrate. LV function remains reduced. I will ask electrophysiology to review for consideration of ICD.

## 2013-12-11 NOTE — Patient Instructions (Signed)
Your physician wants you to follow-up in: 6 MONTHS WITH DR Jens Som You will receive a reminder letter in the mail two months in advance. If you don't receive a letter, please call our office to schedule the follow-up appointment.   REFERRAL TO ELECTROPHYSIOLOGIST AT [address] TO DISCUSS ICD

## 2013-12-11 NOTE — Progress Notes (Signed)
HPI: FU congestive heart failure/cardiomyopathy. Previously followed in [facility]. Last echocardiogram in July of 2014 showed an ejection fraction of 25-30% with grade 2 diastolic dysfunction. There was mild left atrial enlargement. There was mild mitral regurgitation. Nuclear study in April of 2014 showed normal perfusion. Ejection fraction was 24%. Carotid Dopplers in April 2014 showed less than 39% left carotid stenosis. Chest CT in March of 2014 showed no pulmonary embolus, small to moderate right effusion which was felt to be partially loculated. Cardiac catheterization in October of 2014 showed nonobstructive coronary disease and an ejection fraction of 30%. Right heart pressures were normal. Apparently patient has had poor compliance with treatment and poor tolerance of treatment based on outside records. Echocardiogram repeated in June of 2015. Ejection fraction 25-30%. Moderate to severe reduction in RV function and mild right atrial enlargement. Since she was last seen, the patient has dyspnea with more extreme activities but not with routine activities. It is relieved with rest. It is not associated with chest pain. There is no orthopnea, PND or pedal edema. There is no syncope or palpitations. There is no exertional chest pain.    Current Outpatient Prescriptions  Medication Sig Dispense Refill  . aspirin 81 MG tablet Take 81 mg by mouth daily.      . carvedilol (COREG) 25 MG tablet Take 25 mg by mouth 2 (two) times daily with a meal.      . furosemide (LASIX) 20 MG tablet Take 20 mg by mouth daily.      Marland Kitchen glipiZIDE (GLUCOTROL) 5 MG tablet Take 2.5 mg by mouth daily.       . hydrALAZINE (APRESOLINE) 50 MG tablet Take 50 mg by mouth 2 (two) times daily.       . isosorbide mononitrate (IMDUR) 30 MG 24 hr tablet Take 15 mg by mouth daily.      Marland Kitchen losartan (COZAAR) 100 MG tablet Take 100 mg by mouth daily.      . metFORMIN (GLUCOPHAGE) 500 MG tablet Take 1,000 mg by mouth 2 (two) times  daily with a meal.       . omeprazole (PRILOSEC) 20 MG capsule Take 20 mg by mouth 2 (two) times daily.      . Polyvinyl Alcohol-Povidone (REFRESH OP) Place 1 drop into both eyes 2 (two) times daily between meals as needed (for dry eyes).      . pravastatin (PRAVACHOL) 40 MG tablet Take 40 mg by mouth daily.      Marland Kitchen spironolactone (ALDACTONE) 25 MG tablet Take 25 mg by mouth daily.       No current facility-administered medications for this visit.     Past Medical History  Diagnosis Date  . Hypertensive cardiomyopathy     By report there is poor compliance w/treatment, poor tolerance of treatment & poor symptom control. Symptoms include dyspnea & DOE.  . Essential hypertension, benign   . Diabetes mellitus, type 2   . Obesity   . Hyperlipidemia, mixed   . Unspecified menopausal and postmenopausal disorder   . Bruit     Bilat carotid duplex 09/05/12 CONCLUSION: Mild, less than or equal to 39%, left internal carotid artery stenosis.  . Hypersomnia, unspecified   . CHF (congestive heart failure)     MPI 09/05/12 NORMAL, showed no scar & LVEF is 20-25%. ECHO 12/11/12 LV is markedly dilated, Overall LV systolic function severely impaired with EF = 25-30%, pseudonormal LV filling pattern (consistant with elevated LA pressure).  Marland Kitchen  PUD (peptic ulcer disease)     History reviewed. No pertinent past surgical history.  History   Social History  . Marital Status: Married    Spouse Name: N/A    Number of Children: 5  . Years of Education: N/A   Occupational History  .      Retired   Social History Main Topics  . Smoking status: Never Smoker   . Smokeless tobacco: Not on file  . Alcohol Use: No  . Drug Use: No  . Sexual Activity: No   Other Topics Concern  . Not on file   Social History Narrative  . No narrative on file    ROS: no fevers or chills, productive cough, hemoptysis, dysphasia, odynophagia, melena, hematochezia, dysuria, hematuria, rash, seizure activity, orthopnea,  PND, pedal edema, claudication. Remaining systems are negative.  Physical Exam: Well-developed obese in no acute distress.  Skin is warm and dry.  HEENT is normal.  Neck is supple.  Chest is clear to auscultation with normal expansion.  Cardiovascular exam is regular rate and rhythm.  Abdominal exam nontender or distended. No masses palpated. Extremities show no edema. neuro grossly intact  ECG 09/20/2013-sinus rhythm, left ventricular hypertrophy with repolarization abnormality.

## 2013-12-11 NOTE — Assessment & Plan Note (Signed)
Blood pressure controlled. Continue present medications. 

## 2013-12-11 NOTE — Assessment & Plan Note (Signed)
Continue aspirin and statin. 

## 2014-01-07 ENCOUNTER — Encounter: Payer: Self-pay | Admitting: *Deleted

## 2014-01-07 ENCOUNTER — Encounter: Payer: Self-pay | Admitting: Internal Medicine

## 2014-01-07 ENCOUNTER — Ambulatory Visit (INDEPENDENT_AMBULATORY_CARE_PROVIDER_SITE_OTHER): Payer: Medicare Other | Admitting: Internal Medicine

## 2014-01-07 VITALS — BP 148/86 | HR 84 | Ht 63.0 in | Wt 241.8 lb

## 2014-01-07 DIAGNOSIS — I420 Dilated cardiomyopathy: Secondary | ICD-10-CM

## 2014-01-07 DIAGNOSIS — I2584 Coronary atherosclerosis due to calcified coronary lesion: Secondary | ICD-10-CM

## 2014-01-07 DIAGNOSIS — I509 Heart failure, unspecified: Secondary | ICD-10-CM

## 2014-01-07 DIAGNOSIS — I428 Other cardiomyopathies: Secondary | ICD-10-CM

## 2014-01-07 DIAGNOSIS — I110 Hypertensive heart disease with heart failure: Secondary | ICD-10-CM

## 2014-01-07 DIAGNOSIS — I2510 Atherosclerotic heart disease of native coronary artery without angina pectoris: Secondary | ICD-10-CM

## 2014-01-07 DIAGNOSIS — I119 Hypertensive heart disease without heart failure: Secondary | ICD-10-CM | POA: Insufficient documentation

## 2014-01-07 LAB — CBC WITH DIFFERENTIAL/PLATELET
BASOS PCT: 0.3 % (ref 0.0–3.0)
Basophils Absolute: 0 10*3/uL (ref 0.0–0.1)
Eosinophils Absolute: 0.1 10*3/uL (ref 0.0–0.7)
Eosinophils Relative: 2 % (ref 0.0–5.0)
HEMATOCRIT: 40.2 % (ref 36.0–46.0)
Hemoglobin: 13.2 g/dL (ref 12.0–15.0)
LYMPHS ABS: 2.6 10*3/uL (ref 0.7–4.0)
Lymphocytes Relative: 38.7 % (ref 12.0–46.0)
MCHC: 32.8 g/dL (ref 30.0–36.0)
MCV: 90.6 fl (ref 78.0–100.0)
MONO ABS: 0.5 10*3/uL (ref 0.1–1.0)
Monocytes Relative: 7.6 % (ref 3.0–12.0)
NEUTROS PCT: 51.4 % (ref 43.0–77.0)
Neutro Abs: 3.5 10*3/uL (ref 1.4–7.7)
PLATELETS: 236 10*3/uL (ref 150.0–400.0)
RBC: 4.44 Mil/uL (ref 3.87–5.11)
RDW: 14.6 % (ref 11.5–15.5)
WBC: 6.8 10*3/uL (ref 4.0–10.5)

## 2014-01-07 LAB — BASIC METABOLIC PANEL
BUN: 10 mg/dL (ref 6–23)
CO2: 29 mEq/L (ref 19–32)
CREATININE: 0.8 mg/dL (ref 0.4–1.2)
Calcium: 10.3 mg/dL (ref 8.4–10.5)
Chloride: 106 mEq/L (ref 96–112)
GFR: 96.74 mL/min (ref >60.00)
Glucose, Bld: 192 mg/dL — ABNORMAL HIGH (ref 70–99)
Potassium: 4 mEq/L (ref 3.5–5.1)
Sodium: 141 mEq/L (ref 135–145)

## 2014-01-07 NOTE — Patient Instructions (Signed)
Your physician recommends that you schedule a follow-up appointment in: 7-10 days around 9/14 for wound check and 3 months with Dr Johney Frame   See instructions sheet for procedure

## 2014-01-07 NOTE — Progress Notes (Signed)
Primary Care Physician: Anna Hector, MD Referring Physician:  Dr Jacqualine Mau is a 63 y.o. female with a h/o hypertensive cardiomyopathy who presents for EP consultation regarding risks of sudden death.  She reports being told that she had a "weak heart" 15 years ago.  She was initially told that this may viral in etiology.  She has had very difficult to control BP and subsequently the thought is that she has hypertensive cardiomyopathy.  She had a heart catheterization10/14 which did not reveals obstructive CAD.  She most recently had an echo 11/12/13 which showed severe biventricular failure with EF 25%.  She has NYHA Class III CHF symptoms.  She reports dyspnea when walking "short distances".  This is often worse when her blood pressure is elevated.  She has 2-3 pillow orthopnea.  She occasionally has to sleep in a chair.    Today, she denies symptoms of palpitations, chest pain,  lower extremity edema, dizziness, presyncope, syncope, or neurologic sequela. The patient is tolerating medications without difficulties and is otherwise without complaint today.   Past Medical History  Diagnosis Date  . Hypertensive cardiomyopathy     By report there is poor compliance w/treatment, poor tolerance of treatment & poor symptom control. Symptoms include dyspnea & DOE.  . Essential hypertension, benign   . Diabetes mellitus, type 2   . Obesity   . Hyperlipidemia, mixed   . Unspecified menopausal and postmenopausal disorder   . Bruit     Bilat carotid duplex 09/05/12 CONCLUSION: Mild, less than or equal to 39%, left internal carotid artery stenosis.  . Hypersomnia, unspecified   . CHF (congestive heart failure)     MPI 09/05/12 NORMAL, showed no scar & LVEF is 20-25%. ECHO 12/11/12 LV is markedly dilated, Overall LV systolic function severely impaired with EF = 25-30%, pseudonormal LV filling pattern (consistant with elevated LA pressure).  . PUD (peptic ulcer disease)    Past  Surgical History  Procedure Laterality Date  . None      Current Outpatient Prescriptions  Medication Sig Dispense Refill  . aspirin 81 MG tablet Take 81 mg by mouth daily.      . carvedilol (COREG) 25 MG tablet Take 25 mg by mouth 2 (two) times daily with a meal.      . furosemide (LASIX) 20 MG tablet Take 20 mg by mouth daily.      Marland Kitchen glipiZIDE (GLUCOTROL) 5 MG tablet Take 2.5 mg by mouth daily.       . hydrALAZINE (APRESOLINE) 50 MG tablet Take 50 mg by mouth 2 (two) times daily.       Marland Kitchen losartan (COZAAR) 100 MG tablet Take 100 mg by mouth daily.      . metFORMIN (GLUCOPHAGE) 500 MG tablet Take 1,000 mg by mouth 2 (two) times daily with a meal.       . omeprazole (PRILOSEC) 20 MG capsule Take 20 mg by mouth 2 (two) times daily.      . Polyvinyl Alcohol-Povidone (REFRESH OP) Place 1 drop into both eyes 2 (two) times daily between meals as needed (for dry eyes).      . pravastatin (PRAVACHOL) 40 MG tablet Take 40 mg by mouth daily.      Marland Kitchen spironolactone (ALDACTONE) 25 MG tablet Take 25 mg by mouth daily.       No current facility-administered medications for this visit.    Allergies  Allergen Reactions  . Isosorbide Other (See Comments)    Very  bad headaches    History   Social History  . Marital Status: Married    Spouse Name: N/A    Number of Children: 5  . Years of Education: N/A   Occupational History  .      Retired   Social History Main Topics  . Smoking status: Never Smoker   . Smokeless tobacco: Not on file  . Alcohol Use: No  . Drug Use: No  . Sexual Activity: No   Other Topics Concern  . Not on file   Social History Narrative   Lives in [place] with husband and son.  Retired from Designer, fashion/clothing    Family History  Problem Relation Age of Onset  . Diabetes Mellitus II Mother   . Cancer Brother     Colon  . Hypertension Father   Son has a cardiomyopathy and an ICD.  Husband also has an ICD.  ROS- All systems are reviewed and negative except as per the  HPI above  Physical Exam: Filed Vitals:   01/07/14 0848  BP: 174/98  Pulse: 84  Height:  (1.6 m)  Weight: 241 lb 12.8 oz (109.68 kg)    GEN- The patient is overweight appearing, alert and oriented x 3 today.   Head- normocephalic, atraumatic Eyes-  Sclera clear, conjunctiva pink Ears- hearing intact Oropharynx- clear Neck- supple, no JVP Lymph- no cervical lymphadenopathy Lungs- basilar rales, normal work of breathing Heart- Regular rate and rhythm, no murmurs, rubs or gallops, PMI not laterally displaced GI- soft, NT, ND, + BS Extremities- no clubbing, cyanosis, trace edema MS- no significant deformity or atrophy Skin- no rash or lesion Psych- euthymic mood, full affect Neuro- strength and sensation are intact  EKG today reveals sinus rhythm 84 bpm, LVH, nonspecific St/T cvhanges, QRS 96 Echo 11/12/13- EF 25%, biventricular failure Epic records including cath and Dr Waunita Schooner notes are also reviewed  Assessment and Plan:   The patient has a nonischemic CM (EF 32%), NYHA Class III CHF, and HTN. At this time, she meets SCD-HeFT criteria for ICD implantation for primary prevention of sudden death.  She does not have evidence of dyssynchrony and therefore does not meet criteria for CRT.  Risks, benefits, alternatives to ICD implantation were discussed in detail with the patient today. The patient  understands that the risks include but are not limited to bleeding, infection, pneumothorax, perforation, tamponade, vascular damage, renal failure, MI, stroke, death, inappropriate shocks, and lead dislodgement and wishes to proceed.  We will therefore schedule device implantation at the next available time.

## 2014-01-11 ENCOUNTER — Encounter (HOSPITAL_COMMUNITY): Payer: Self-pay | Admitting: Pharmacy Technician

## 2014-01-16 DIAGNOSIS — I43 Cardiomyopathy in diseases classified elsewhere: Secondary | ICD-10-CM | POA: Diagnosis not present

## 2014-01-16 DIAGNOSIS — Z7982 Long term (current) use of aspirin: Secondary | ICD-10-CM | POA: Diagnosis not present

## 2014-01-16 DIAGNOSIS — K279 Peptic ulcer, site unspecified, unspecified as acute or chronic, without hemorrhage or perforation: Secondary | ICD-10-CM | POA: Diagnosis not present

## 2014-01-16 DIAGNOSIS — E669 Obesity, unspecified: Secondary | ICD-10-CM | POA: Diagnosis not present

## 2014-01-16 DIAGNOSIS — I110 Hypertensive heart disease with heart failure: Secondary | ICD-10-CM | POA: Diagnosis not present

## 2014-01-16 DIAGNOSIS — E782 Mixed hyperlipidemia: Secondary | ICD-10-CM | POA: Diagnosis not present

## 2014-01-16 DIAGNOSIS — G4710 Hypersomnia, unspecified: Secondary | ICD-10-CM | POA: Diagnosis not present

## 2014-01-16 DIAGNOSIS — E119 Type 2 diabetes mellitus without complications: Secondary | ICD-10-CM | POA: Diagnosis not present

## 2014-01-16 DIAGNOSIS — Z6841 Body Mass Index (BMI) 40.0 and over, adult: Secondary | ICD-10-CM | POA: Diagnosis not present

## 2014-01-16 DIAGNOSIS — I509 Heart failure, unspecified: Secondary | ICD-10-CM | POA: Diagnosis present

## 2014-01-16 MED ORDER — CEFAZOLIN SODIUM-DEXTROSE 2-3 GM-% IV SOLR: 2.0000 g | INTRAVENOUS | Status: DC

## 2014-01-16 MED ORDER — SODIUM CHLORIDE 0.9 % IR SOLN
80.0000 mg | Status: DC
  Filled 2014-01-16: qty 2

## 2014-01-16 MED ORDER — SODIUM CHLORIDE 0.9 % IV SOLN
INTRAVENOUS | Status: DC
  Administered 2014-01-17: 08:00:00 via INTRAVENOUS

## 2014-01-17 ENCOUNTER — Encounter (HOSPITAL_COMMUNITY)
Admission: RE | Disposition: A | Discharge status: Home / Self Care | Payer: Self-pay | Source: Ambulatory Visit | Attending: Internal Medicine

## 2014-01-17 ENCOUNTER — Encounter (HOSPITAL_COMMUNITY): Payer: Self-pay | Admitting: General Practice

## 2014-01-17 ENCOUNTER — Ambulatory Visit (HOSPITAL_COMMUNITY)
Admission: RE | Admit: 2014-01-17 | Discharge: 2014-01-18 | Disposition: A | Discharge status: Home / Self Care | Payer: Medicare Other | Source: Ambulatory Visit | Attending: Internal Medicine | Admitting: Internal Medicine

## 2014-01-17 DIAGNOSIS — I43 Cardiomyopathy in diseases classified elsewhere: Secondary | ICD-10-CM | POA: Insufficient documentation

## 2014-01-17 DIAGNOSIS — I119 Hypertensive heart disease without heart failure: Secondary | ICD-10-CM | POA: Diagnosis present

## 2014-01-17 DIAGNOSIS — G4710 Hypersomnia, unspecified: Secondary | ICD-10-CM | POA: Insufficient documentation

## 2014-01-17 DIAGNOSIS — I509 Heart failure, unspecified: Secondary | ICD-10-CM | POA: Diagnosis not present

## 2014-01-17 DIAGNOSIS — Z95810 Presence of automatic (implantable) cardiac defibrillator: Secondary | ICD-10-CM

## 2014-01-17 DIAGNOSIS — E119 Type 2 diabetes mellitus without complications: Secondary | ICD-10-CM | POA: Insufficient documentation

## 2014-01-17 DIAGNOSIS — I110 Hypertensive heart disease with heart failure: Secondary | ICD-10-CM

## 2014-01-17 DIAGNOSIS — I5022 Chronic systolic (congestive) heart failure: Secondary | ICD-10-CM | POA: Diagnosis present

## 2014-01-17 DIAGNOSIS — K279 Peptic ulcer, site unspecified, unspecified as acute or chronic, without hemorrhage or perforation: Secondary | ICD-10-CM | POA: Insufficient documentation

## 2014-01-17 DIAGNOSIS — E669 Obesity, unspecified: Secondary | ICD-10-CM | POA: Insufficient documentation

## 2014-01-17 DIAGNOSIS — I2510 Atherosclerotic heart disease of native coronary artery without angina pectoris: Secondary | ICD-10-CM

## 2014-01-17 DIAGNOSIS — I2584 Coronary atherosclerosis due to calcified coronary lesion: Secondary | ICD-10-CM

## 2014-01-17 DIAGNOSIS — Z6841 Body Mass Index (BMI) 40.0 and over, adult: Secondary | ICD-10-CM | POA: Insufficient documentation

## 2014-01-17 DIAGNOSIS — Z7982 Long term (current) use of aspirin: Secondary | ICD-10-CM | POA: Insufficient documentation

## 2014-01-17 DIAGNOSIS — I428 Other cardiomyopathies: Secondary | ICD-10-CM

## 2014-01-17 DIAGNOSIS — I420 Dilated cardiomyopathy: Secondary | ICD-10-CM | POA: Diagnosis present

## 2014-01-17 DIAGNOSIS — E782 Mixed hyperlipidemia: Secondary | ICD-10-CM | POA: Insufficient documentation

## 2014-01-17 DIAGNOSIS — I5042 Chronic combined systolic (congestive) and diastolic (congestive) heart failure: Secondary | ICD-10-CM | POA: Diagnosis present

## 2014-01-17 DIAGNOSIS — Z006 Encounter for examination for normal comparison and control in clinical research program: Secondary | ICD-10-CM

## 2014-01-17 HISTORY — PX: IMPLANTABLE CARDIOVERTER DEFIBRILLATOR IMPLANT: SHX5473

## 2014-01-17 HISTORY — PX: CARDIAC DEFIBRILLATOR PLACEMENT: SHX171

## 2014-01-17 HISTORY — DX: Presence of automatic (implantable) cardiac defibrillator: Z95.810

## 2014-01-17 LAB — SURGICAL PCR SCREEN
MRSA, PCR: NEGATIVE
Staphylococcus aureus: NEGATIVE

## 2014-01-17 LAB — GLUCOSE, CAPILLARY
GLUCOSE-CAPILLARY: 114 mg/dL — AB (ref 70–99)
Glucose-Capillary: 189 mg/dL — ABNORMAL HIGH (ref 70–99)
Glucose-Capillary: 117 mg/dL — ABNORMAL HIGH (ref 70–99)
Glucose-Capillary: 107 mg/dL — ABNORMAL HIGH (ref 70–99)

## 2014-01-17 SURGERY — IMPLANTABLE CARDIOVERTER DEFIBRILLATOR IMPLANT
Anesthesia: LOCAL

## 2014-01-17 MED ORDER — MUPIROCIN 2 % EX OINT
TOPICAL_OINTMENT | CUTANEOUS | Status: AC
  Administered 2014-01-17: 1 via NASAL
  Filled 2014-01-17: qty 22

## 2014-01-17 MED ORDER — CARVEDILOL 25 MG PO TABS
25.0000 mg | ORAL_TABLET | Freq: Two times a day (BID) | ORAL | Status: DC
  Administered 2014-01-17: 25 mg via ORAL
  Filled 2014-01-17 (×4): qty 1

## 2014-01-17 MED ORDER — MIDAZOLAM HCL 5 MG/5ML IJ SOLN
INTRAMUSCULAR | Status: AC
  Filled 2014-01-17: qty 5

## 2014-01-17 MED ORDER — PANTOPRAZOLE SODIUM 40 MG PO TBEC
40.0000 mg | DELAYED_RELEASE_TABLET | Freq: Every day | ORAL | Status: DC
  Administered 2014-01-17: 40 mg via ORAL
  Filled 2014-01-17: qty 1

## 2014-01-17 MED ORDER — VERAPAMIL HCL 2.5 MG/ML IV SOLN
INTRAVENOUS | Status: AC
  Filled 2014-01-17: qty 2

## 2014-01-17 MED ORDER — SODIUM CHLORIDE 0.9 % IJ SOLN
3.0000 mL | Freq: Two times a day (BID) | INTRAMUSCULAR | Status: DC
  Administered 2014-01-17: 3 mL via INTRAVENOUS

## 2014-01-17 MED ORDER — HYDROCODONE-ACETAMINOPHEN 5-325 MG PO TABS: 1.0000 | ORAL_TABLET | ORAL | Status: DC | PRN

## 2014-01-17 MED ORDER — HYDRALAZINE HCL 50 MG PO TABS
50.0000 mg | ORAL_TABLET | Freq: Two times a day (BID) | ORAL | Status: DC
  Administered 2014-01-17 (×2): 50 mg via ORAL
  Filled 2014-01-17 (×4): qty 1

## 2014-01-17 MED ORDER — FUROSEMIDE 20 MG PO TABS
20.0000 mg | ORAL_TABLET | Freq: Every day | ORAL | Status: DC
  Administered 2014-01-17: 20 mg via ORAL
  Filled 2014-01-17 (×2): qty 1

## 2014-01-17 MED ORDER — CEFAZOLIN SODIUM-DEXTROSE 2-3 GM-% IV SOLR
INTRAVENOUS | Status: AC
  Filled 2014-01-17: qty 50

## 2014-01-17 MED ORDER — GLIPIZIDE 2.5 MG HALF TABLET
2.5000 mg | ORAL_TABLET | Freq: Every day | ORAL | Status: DC
  Administered 2014-01-17: 2.5 mg via ORAL
  Filled 2014-01-17 (×2): qty 1

## 2014-01-17 MED ORDER — MUPIROCIN 2 % EX OINT
TOPICAL_OINTMENT | Freq: Once | CUTANEOUS | Status: AC
  Administered 2014-01-17: 1 via NASAL
  Filled 2014-01-17: qty 22

## 2014-01-17 MED ORDER — LIDOCAINE HCL (PF) 1 % IJ SOLN
INTRAMUSCULAR | Status: AC
  Filled 2014-01-17: qty 30

## 2014-01-17 MED ORDER — SPIRONOLACTONE 25 MG PO TABS
25.0000 mg | ORAL_TABLET | Freq: Every day | ORAL | Status: DC
  Administered 2014-01-17: 25 mg via ORAL
  Filled 2014-01-17 (×2): qty 1

## 2014-01-17 MED ORDER — LIDOCAINE HCL (PF) 1 % IJ SOLN
INTRAMUSCULAR | Status: AC
  Filled 2014-01-17: qty 60

## 2014-01-17 MED ORDER — ACETAMINOPHEN 325 MG PO TABS: 325.0000 mg | ORAL_TABLET | ORAL | Status: DC | PRN

## 2014-01-17 MED ORDER — ONDANSETRON HCL 4 MG/2ML IJ SOLN: 4.0000 mg | Freq: Four times a day (QID) | INTRAMUSCULAR | Status: DC | PRN

## 2014-01-17 MED ORDER — CHLORHEXIDINE GLUCONATE 4 % EX LIQD
60.0000 mL | Freq: Once | CUTANEOUS | Status: DC
  Filled 2014-01-17: qty 60

## 2014-01-17 MED ORDER — CARBOXYMETHYLCELLULOSE SODIUM 1 % OP SOLN: 1.0000 [drp] | Freq: Two times a day (BID) | OPHTHALMIC | Status: DC | PRN

## 2014-01-17 MED ORDER — FENTANYL CITRATE 0.05 MG/ML IJ SOLN
INTRAMUSCULAR | Status: AC
  Filled 2014-01-17: qty 2

## 2014-01-17 MED ORDER — SODIUM CHLORIDE 0.9 % IJ SOLN: 3.0000 mL | INTRAMUSCULAR | Status: DC | PRN

## 2014-01-17 MED ORDER — CEFAZOLIN SODIUM 1-5 GM-% IV SOLN
1.0000 g | Freq: Four times a day (QID) | INTRAVENOUS | Status: AC
  Administered 2014-01-17 – 2014-01-18 (×3): 1 g via INTRAVENOUS
  Filled 2014-01-17 (×3): qty 50

## 2014-01-17 MED ORDER — SODIUM CHLORIDE 0.9 % IV SOLN: 250.0000 mL | INTRAVENOUS | Status: DC | PRN

## 2014-01-17 MED ORDER — POLYVINYL ALCOHOL 1.4 % OP SOLN
1.0000 [drp] | Freq: Two times a day (BID) | OPHTHALMIC | Status: DC | PRN
  Filled 2014-01-17: qty 15

## 2014-01-17 MED ORDER — HEPARIN (PORCINE) IN NACL 2-0.9 UNIT/ML-% IJ SOLN
INTRAMUSCULAR | Status: AC
  Filled 2014-01-17: qty 500

## 2014-01-17 MED ORDER — LOSARTAN POTASSIUM 50 MG PO TABS
100.0000 mg | ORAL_TABLET | Freq: Every day | ORAL | Status: DC
  Administered 2014-01-17: 100 mg via ORAL
  Filled 2014-01-17 (×2): qty 2

## 2014-01-17 NOTE — Op Note (Signed)
SURGEON:  Hillis Range, MD      PREPROCEDURE DIAGNOSES:   1. Nonischemic cardiomyopathy.   2. New York Heart Association class III, heart failure chronically.      POSTPROCEDURE DIAGNOSES:   1. Nonischemic cardiomyopathy.   2. New York Heart Association class III, heart failure chronically.      PROCEDURES:    1. ICD implantation.  2. Left upper extremity venography  3. Defibrillation threshold testing     INTRODUCTION:  Anna Beltran is a 63 y.o. female with a nonischemic CM (EF 25%), NYHA Class III CHF, and HTN. At this time, she meets SCD-HeFT criteria for ICD implantation for primary prevention of sudden death.  The patient has a narrow QRS and does not meet criteria for revascularization.  The patient has been treated with an optimal medical regimen but continues to have a depressed ejection fraction and NYHA Class III CHF symptoms.  The patient therefore  presents today for ICD implantation.      DESCRIPTION OF PROCEDURE:  Informed written consent was obtained and the patient was brought to the electrophysiology lab in the fasting state. The patient was adequately sedated with intravenous Versed, and fentanyl as outlined in the nursing report.  The patient's left chest was prepped and draped in the usual sterile fashion by the EP lab staff.  The skin overlying the left deltopectoral region was infiltrated with lidocaine for local analgesia.  A 5-cm incision was made over the left deltopectoral region.  A left subcutaneous defibrillator pocket was fashioned using a combination of sharp and blunt dissection.  Electrocautery was used to assure hemostasis.   Left Upper extremity Venography:  A venogram of the left upper extremity was performed which revealed a moderate sized left axillary vein which emptied into a moderate sized left subclavian vein.    RA/RV Lead Placement: The left axillary vein was cannulated with fluoroscopic visualization.  Through the left axillary vein, a Medtronic  model model [id number] (serial number [id number]) right ventricular defibrillator lead were advanced with fluoroscopic visualization into the right ventricular apex position.  Initial right ventricular lead R-wave measured 15.5 mV with impedance of 605 ohms and a threshold of 0.9 volts at 0.5 milliseconds.   The leads were secured to the pectoralis  fascia using #2 silk suture over the suture sleeves.  The pocket then  irrigated with copious gentamicin solution.  The leads were then  connected to a Medtronic Evera XT VR (serial  Number [id number]) ICD.  The defibrillator was placed into the  pocket.  The pocket was then closed in 2 layers with 2.0 Vicryl suture  for the subcutaneous and subcuticular layers. EBL<75ml.  Steri-Strips and a  sterile dressing were then applied.   DFT Testing: Defibrillation Threshold testing was then performed. Ventricular fibrillation was induced with a T shock.  Adequate sensing of ventricular  fibrillation was observed with minimal dropout with a programmed sensitivity of 1.28mV.  An initial 78 J shock was unsuccessful in defibrillating the patient.  The patient was then successfully defibrillated with a 20 joules shock delivered from the device with an impedance of 77 ohms in a duration of 4.4 seconds.   The patient had transient atrial fibrillation upon defibrillation but then spontaneously converted to sinus rhythm.  The patient remained in sinus rhythm thereafter.  There were no early apparent complications.     CONCLUSIONS:   1. Nonischemic cardiomyopathy with chronic New York Heart Association class III heart failure.   2. Successful ICD implantation.  3. DFT 20 J   4. No early apparent complications.

## 2014-01-17 NOTE — Interval H&P Note (Signed)
History and Physical Interval Note:  01/17/2014 8:06 AM  Anna Beltran  has presented today for surgery, with the diagnosis of chf/LV disfunction  The various methods of treatment have been discussed with the patient and family. After consideration of risks, benefits and other options for treatment, the patient has consented to  Procedure(s): IMPLANTABLE CARDIOVERTER DEFIBRILLATOR IMPLANT (N/A) as a surgical intervention .  The patient's history has been reviewed, patient examined, no change in status, stable for surgery.  I have reviewed the patient's chart and labs.  Questions were answered to the patient's satisfaction.    ICD Criteria  Current LVEF:25% ;Obtained > or = 1 month ago and < or = 3 months ago.  NYHA Functional Classification: Class III  Heart Failure History:  Yes, Duration of heart failure since onset is > 9 months  Non-Ischemic Dilated Cardiomyopathy History:  Yes, timeframe is > 9 months  Atrial Fibrillation/Atrial Flutter:  No.  Ventricular Tachycardia History:  No.  Cardiac Arrest History:  No  History of Syndromes with Risk of Sudden Death:  No.  Previous ICD:  No.  Electrophysiology Study: No.  Prior MI: No.  PPM: No.  OSA:  No  Patient Life Expectancy of >=1 year: Yes.  Anticoagulation Therapy:  Patient is NOT on anticoagulation therapy.   Beta Blocker Therapy:  Yes.   Ace Inhibitor/ARB Therapy:  Yes.    Hillis Range

## 2014-01-17 NOTE — H&P (View-Only) (Signed)
Primary Care Physician: Josue Hector, MD Referring Physician:  Dr Jacqualine Mau is a 63 y.o. female with a h/o hypertensive cardiomyopathy who presents for EP consultation regarding risks of sudden death.  She reports being told that she had a "weak heart" 15 years ago.  She was initially told that this may viral in etiology.  She has had very difficult to control BP and subsequently the thought is that she has hypertensive cardiomyopathy.  She had a heart catheterization10/14 which did not reveals obstructive CAD.  She most recently had an echo 11/12/13 which showed severe biventricular failure with EF 25%.  She has NYHA Class III CHF symptoms.  She reports dyspnea when walking "short distances".  This is often worse when her blood pressure is elevated.  She has 2-3 pillow orthopnea.  She occasionally has to sleep in a chair.    Today, she denies symptoms of palpitations, chest pain,  lower extremity edema, dizziness, presyncope, syncope, or neurologic sequela. The patient is tolerating medications without difficulties and is otherwise without complaint today.   Past Medical History  Diagnosis Date  . Hypertensive cardiomyopathy     By report there is poor compliance w/treatment, poor tolerance of treatment & poor symptom control. Symptoms include dyspnea & DOE.  . Essential hypertension, benign   . Diabetes mellitus, type 2   . Obesity   . Hyperlipidemia, mixed   . Unspecified menopausal and postmenopausal disorder   . Bruit     Bilat carotid duplex 09/05/12 CONCLUSION: Mild, less than or equal to 39%, left internal carotid artery stenosis.  . Hypersomnia, unspecified   . CHF (congestive heart failure)     MPI 09/05/12 NORMAL, showed no scar & LVEF is 20-25%. ECHO 12/11/12 LV is markedly dilated, Overall LV systolic function severely impaired with EF = 25-30%, pseudonormal LV filling pattern (consistant with elevated LA pressure).  . PUD (peptic ulcer disease)    Past  Surgical History  Procedure Laterality Date  . None      Current Outpatient Prescriptions  Medication Sig Dispense Refill  . aspirin 81 MG tablet Take 81 mg by mouth daily.      . carvedilol (COREG) 25 MG tablet Take 25 mg by mouth 2 (two) times daily with a meal.      . furosemide (LASIX) 20 MG tablet Take 20 mg by mouth daily.      Marland Kitchen glipiZIDE (GLUCOTROL) 5 MG tablet Take 2.5 mg by mouth daily.       . hydrALAZINE (APRESOLINE) 50 MG tablet Take 50 mg by mouth 2 (two) times daily.       Marland Kitchen losartan (COZAAR) 100 MG tablet Take 100 mg by mouth daily.      . metFORMIN (GLUCOPHAGE) 500 MG tablet Take 1,000 mg by mouth 2 (two) times daily with a meal.       . omeprazole (PRILOSEC) 20 MG capsule Take 20 mg by mouth 2 (two) times daily.      . Polyvinyl Alcohol-Povidone (REFRESH OP) Place 1 drop into both eyes 2 (two) times daily between meals as needed (for dry eyes).      . pravastatin (PRAVACHOL) 40 MG tablet Take 40 mg by mouth daily.      Marland Kitchen spironolactone (ALDACTONE) 25 MG tablet Take 25 mg by mouth daily.       No current facility-administered medications for this visit.    Allergies  Allergen Reactions  . Isosorbide Other (See Comments)    Very  bad headaches    History   Social History  . Marital Status: Married    Spouse Name: N/A    Number of Children: 5  . Years of Education: N/A   Occupational History  .      Retired   Social History Main Topics  . Smoking status: Never Smoker   . Smokeless tobacco: Not on file  . Alcohol Use: No  . Drug Use: No  . Sexual Activity: No   Other Topics Concern  . Not on file   Social History Narrative   Lives in [place]  with husband and son.  Retired from textiles    Family History  Problem Relation Age of Onset  . Diabetes Mellitus II Mother   . Cancer Brother     Colon  . Hypertension Father   Son has a cardiomyopathy and an ICD.  Husband also has an ICD.  ROS- All systems are reviewed and negative except as per the  HPI above  Physical Exam: Filed Vitals:   01/07/14 0848  BP: 174/98  Pulse: 84  Height: 5' 3" (1.6 m)  Weight: 241 lb 12.8 oz (109.68 kg)    GEN- The patient is overweight appearing, alert and oriented x 3 today.   Head- normocephalic, atraumatic Eyes-  Sclera clear, conjunctiva pink Ears- hearing intact Oropharynx- clear Neck- supple, no JVP Lymph- no cervical lymphadenopathy Lungs- basilar rales, normal work of breathing Heart- Regular rate and rhythm, no murmurs, rubs or gallops, PMI not laterally displaced GI- soft, NT, ND, + BS Extremities- no clubbing, cyanosis, trace edema MS- no significant deformity or atrophy Skin- no rash or lesion Psych- euthymic mood, full affect Neuro- strength and sensation are intact  EKG today reveals sinus rhythm 84 bpm, LVH, nonspecific St/T cvhanges, QRS 96 Echo 11/12/13- EF 25%, biventricular failure Epic records including cath and Dr Crenshaws notes are also reviewed  Assessment and Plan:   The patient has a nonischemic CM (EF 32%), NYHA Class III CHF, and HTN. At this time, she meets SCD-HeFT criteria for ICD implantation for primary prevention of sudden death.  She does not have evidence of dyssynchrony and therefore does not meet criteria for CRT.  Risks, benefits, alternatives to ICD implantation were discussed in detail with the patient today. The patient  understands that the risks include but are not limited to bleeding, infection, pneumothorax, perforation, tamponade, vascular damage, renal failure, MI, stroke, death, inappropriate shocks, and lead dislodgement and wishes to proceed.  We will therefore schedule device implantation at the next available time.  

## 2014-01-18 ENCOUNTER — Ambulatory Visit (HOSPITAL_COMMUNITY): Payer: Medicare Other

## 2014-01-18 DIAGNOSIS — I43 Cardiomyopathy in diseases classified elsewhere: Secondary | ICD-10-CM | POA: Diagnosis not present

## 2014-01-18 DIAGNOSIS — I428 Other cardiomyopathies: Secondary | ICD-10-CM

## 2014-01-18 DIAGNOSIS — I509 Heart failure, unspecified: Secondary | ICD-10-CM | POA: Diagnosis not present

## 2014-01-18 DIAGNOSIS — I110 Hypertensive heart disease with heart failure: Secondary | ICD-10-CM | POA: Diagnosis not present

## 2014-01-18 DIAGNOSIS — E119 Type 2 diabetes mellitus without complications: Secondary | ICD-10-CM | POA: Diagnosis not present

## 2014-01-18 LAB — BASIC METABOLIC PANEL
ANION GAP: 11 (ref 5–15)
BUN: 14 mg/dL (ref 6–23)
CALCIUM: 10 mg/dL (ref 8.4–10.5)
CHLORIDE: 100 meq/L (ref 96–112)
CO2: 26 mEq/L (ref 19–32)
CREATININE: 0.87 mg/dL (ref 0.50–1.10)
GFR, EST AFRICAN AMERICAN: 79 mL/min — AB (ref >90)
GFR, EST NON AFRICAN AMERICAN: 68 mL/min — AB (ref >90)
Glucose, Bld: 159 mg/dL — ABNORMAL HIGH (ref 70–99)
Potassium: 4.2 mEq/L (ref 3.7–5.3)
Sodium: 137 mEq/L (ref 137–147)

## 2014-01-18 LAB — GLUCOSE, CAPILLARY: Glucose-Capillary: 157 mg/dL — ABNORMAL HIGH (ref 70–99)

## 2014-01-18 NOTE — Discharge Summary (Signed)
Physician Discharge Summary      Patient ID: Anna Beltran MRN: [id number] DOB/AGE: 05/25/1950 63 y.o.  Admit date: 01/17/2014 Discharge date: 01/18/2014  Primary Discharge Diagnosis  Nonischemic cardiomyopathy Secondary Discharge Diagnosis hypertension  Significant Diagnostic Studies: Defibrillator implantation   Hospital Course:   Anna Beltran is a 63 y.o. female with a nonischemic CM (EF 25%), NYHA Class III CHF, and HTN.  She was electively admitted for ICD implantation for primary prevention of sudden death. The patient has a narrow QRS and does not meet criteria for revascularization. The patient has been treated with an optimal medical regimen but continues to have a depressed ejection fraction and NYHA Class III CHF symptoms.   She presented to [facility] and underwent implantation of a Medtronic Evera XT VR (serial Number [id number]) ICD. She was observed overnight without complication. Device interrogation revealed normal device function prior to discharge. CXR revealed stable lead position and no pneumothorax.   At time of discharge the patient was alert, ambulatory, and without complaint.  Discharge Exam: Blood pressure 134/74, pulse 78, temperature 97.6 F (36.4 C), temperature source Oral, resp. rate 18, height 5\' 3"  (1.6 m), weight 241 lb 2.9 oz (109.399 kg), SpO2 96.00%.   Physical Exam: Filed Vitals:   01/17/14 1300 01/17/14 1635 01/17/14 2042 01/18/14 0455  BP: 150/87 141/72 136/66 134/74  Pulse: 77 87 76 78  Temp:   98.2 F (36.8 C) 97.6 F (36.4 C)  TempSrc:   Oral Oral  Resp:   18 18  Height:      Weight:    241 lb 2.9 oz (109.399 kg)  SpO2:   94% 96%    GEN- The patient is well appearing, alert and oriented x 3 today.   Head- normocephalic, atraumatic Eyes-  Sclera clear, conjunctiva pink Ears- hearing intact Oropharynx- clear Neck- supple, Lungs- Clear to ausculation bilaterally, normal work of breathing Heart- Regular rate and rhythm, no murmurs,  rubs or gallops, PMI not laterally displaced GI- soft, NT, ND, + BS Extremities- no clubbing, cyanosis, or edema  MS- no significant deformity or atrophy Skin- device pocket is without hematoma Neuro- strength and sensation are intact     Labs:   Lab Results  Component Value Date   WBC 6.8 01/07/2014   HGB 13.2 01/07/2014   HCT 40.2 01/07/2014   MCV 90.6 01/07/2014   PLT 236.0 01/07/2014     Recent Labs Lab 01/18/14 0438  NA 137  K 4.2  CL 100  CO2 26  BUN 14  CREATININE 0.87  CALCIUM 10.0  GLUCOSE 159*   FOLLOW UP PLANS AND APPOINTMENTS    Medication List         aspirin 81 MG tablet  Take 81 mg by mouth daily.     carvedilol 25 MG tablet  Commonly known as:  COREG  Take 25 mg by mouth 2 (two) times daily with a meal.     furosemide 20 MG tablet  Commonly known as:  LASIX  Take 20 mg by mouth daily.     glipiZIDE 5 MG tablet  Commonly known as:  GLUCOTROL  Take 2.5 mg by mouth daily.     hydrALAZINE 50 MG tablet  Commonly known as:  APRESOLINE  Take 50 mg by mouth 2 (two) times daily.     losartan 100 MG tablet  Commonly known as:  COZAAR  Take 100 mg by mouth daily.     metFORMIN 500 MG tablet  Commonly known  as:  GLUCOPHAGE  Take 1,000 mg by mouth 2 (two) times daily with a meal.     omeprazole 20 MG capsule  Commonly known as:  PRILOSEC  Take 20 mg by mouth 2 (two) times daily.     pravastatin 40 MG tablet  Commonly known as:  PRAVACHOL  Take 40 mg by mouth daily.     REFRESH OP  Place 1 drop into both eyes 2 (two) times daily between meals as needed (for dry eyes).     spironolactone 25 MG tablet  Commonly known as:  ALDACTONE  Take 25 mg by mouth daily.       Follow-up Information   Follow up with [facility] 1 On 01/30/2014. (9:30 am)       Follow up with Hillis Range, MD In 90 days. (office will call you with this appointment)    Specialty:  Cardiology   Contact information:   [address] [place]  [address] [phone]       BRING ALL MEDICATIONS WITH YOU TO FOLLOW UP APPOINTMENTS  Time spent with patient to include physician time: 30 minutes Signed: Hillis Range, MD 01/18/2014, 8:54 AM

## 2014-01-18 NOTE — Discharge Instructions (Signed)
° ° ° °  Supplemental Discharge Instructions for  °Pacemaker/Defibrillator Patients ° °Activity °No heavy lifting or vigorous activity with your left/right arm for 6 to 8 weeks.  Do not raise your left/right arm above your head for one week.  Gradually raise your affected arm as drawn below. ° °        ° °            In 3 days                   In 4 days                    In 5 days                    In 6 days           ° °NO DRIVING for  7 days    °WOUND CARE °  Keep the wound area clean and dry.  Do not get this area wet for one week. No showers for 10 days. °  The tape/steri-strips on your wound will fall off; do not pull them off.  No bandage is needed on the site.  DO  NOT apply any creams, oils, or ointments to the wound area. °  If you notice any drainage or discharge from the wound, any swelling or bruising at the site, or you develop a fever > 101? F after you are discharged home, call the office at once. ° °Special Instructions °  You are still able to use cellular telephones; use the ear opposite the side where you have your pacemaker/defibrillator.  Avoid carrying your cellular phone near your device. °  When traveling through airports, show security personnel your identification card to avoid being screened in the metal detectors.  Ask the security personnel to use the hand wand. °  Avoid arc welding equipment, MRI testing (magnetic resonance imaging), TENS units (transcutaneous nerve stimulators).  Call the office for questions about other devices. °  Avoid electrical appliances that are in poor condition or are not properly grounded. °  Microwave ovens are safe to be near or to operate. ° °Additional information for defibrillator patients should your device go off: °  If your device goes off ONCE and you feel fine afterward, notify the device clinic nurses. °  If your device goes off ONCE and you do not feel well afterward, call 911. °  If your device goes off TWICE, call 911. °  If your device goes  off THREE times in one day, call 911. ° °DO NOT DRIVE YOURSELF OR A FAMILY MEMBER °WITH A DEFIBRILLATOR TO THE HOSPITAL--CALL 911. ° °

## 2014-01-18 NOTE — Progress Notes (Signed)
Pt discharged to home per MD order. Pt received and reviewed all discharge instructions and medication information including follow-up appointments and prescription information. Pt verbalized understanding. Pt alert and oriented at discharge with no complaints of pain. Pt IV and telemetry box removed prior to discharge. Pt escorted to private vehicle via wheelchair by guest services volunteer. Marionna Gonia C  

## 2014-01-30 ENCOUNTER — Ambulatory Visit (INDEPENDENT_AMBULATORY_CARE_PROVIDER_SITE_OTHER): Payer: Medicare Other | Admitting: *Deleted

## 2014-01-30 DIAGNOSIS — Z95810 Presence of automatic (implantable) cardiac defibrillator: Secondary | ICD-10-CM

## 2014-01-30 DIAGNOSIS — I5022 Chronic systolic (congestive) heart failure: Secondary | ICD-10-CM

## 2014-01-30 DIAGNOSIS — I509 Heart failure, unspecified: Secondary | ICD-10-CM

## 2014-01-30 DIAGNOSIS — I428 Other cardiomyopathies: Secondary | ICD-10-CM

## 2014-01-30 DIAGNOSIS — I420 Dilated cardiomyopathy: Secondary | ICD-10-CM

## 2014-01-30 LAB — MDC_IDC_ENUM_SESS_TYPE_INCLINIC
Battery Remaining Longevity: 137 mo
Date Time Interrogation Session: 20150916095428
HIGH POWER IMPEDANCE MEASURED VALUE: 190 Ohm
HIGH POWER IMPEDANCE MEASURED VALUE: 61 Ohm
Lead Channel Impedance Value: 418 Ohm
Lead Channel Pacing Threshold Amplitude: 1.125 V
Lead Channel Pacing Threshold Pulse Width: 0.4 ms
Lead Channel Sensing Intrinsic Amplitude: 17.75 mV
Lead Channel Sensing Intrinsic Amplitude: 19.75 mV
Lead Channel Setting Pacing Amplitude: 3.5 V
Lead Channel Setting Pacing Pulse Width: 0.4 ms
MDC IDC MSMT BATTERY VOLTAGE: 3.11 V
MDC IDC SET LEADCHNL RV SENSING SENSITIVITY: 0.3 mV
MDC IDC SET ZONE DETECTION INTERVAL: 360 ms
MDC IDC STAT BRADY RV PERCENT PACED: 0 %
Zone Setting Detection Interval: 300 ms
Zone Setting Detection Interval: 340 ms

## 2014-01-30 NOTE — Progress Notes (Signed)
Wound check appointment. Steri-strips removed. Wound without redness or edema. Incision edges approximated, wound well healed. Normal device function. Thresholds, sensing, and impedances consistent with implant measurements. Device programmed at 3.5V for extra safety margin until 3 month visit. Histogram distribution appropriate for patient and level of activity. No ventricular arrhythmias noted. Patient educated about wound care, arm mobility, lifting restrictions, shock plan. ROV w/ Dr. Johney Frame in 86mo.

## 2014-02-05 ENCOUNTER — Encounter: Payer: Self-pay | Admitting: *Deleted

## 2014-04-16 ENCOUNTER — Other Ambulatory Visit: Payer: Self-pay | Admitting: *Deleted

## 2014-04-16 MED ORDER — LOSARTAN POTASSIUM 100 MG PO TABS: 100.0000 mg | ORAL_TABLET | Freq: Every day | ORAL | Status: DC

## 2014-04-25 ENCOUNTER — Encounter (HOSPITAL_COMMUNITY): Payer: Self-pay | Admitting: Cardiology

## 2014-06-15 NOTE — Progress Notes (Signed)
HPI: FU congestive heart failure/cardiomyopathy. Previously followed in [facility]. Last echocardiogram in July of 2014 showed an ejection fraction of 25-30% with grade 2 diastolic dysfunction. There was mild left atrial enlargement. There was mild mitral regurgitation. Nuclear study in April of 2014 showed normal perfusion. Ejection fraction was 24%. Carotid Dopplers in April 2014 showed less than 39% left carotid stenosis. Chest CT in March of 2014 showed no pulmonary embolus, small to moderate right effusion which was felt to be partially loculated. Cardiac catheterization in October of 2014 showed nonobstructive coronary disease and an ejection fraction of 30%. Right heart pressures were normal. Apparently patient has had poor compliance with treatment and poor tolerance of treatment based on outside records. Echocardiogram repeated in June of 2015. Ejection fraction 25-30%. Moderate to severe reduction in RV function and mild right atrial enlargement. ICD placed 9/15. Since she was last seen, the patient has dyspnea with more extreme activities but not with routine activities. It is relieved with rest. It is not associated with chest pain. There is no orthopnea, PND or pedal edema. There is no syncope or palpitations. There is no exertional chest pain.   Current Outpatient Prescriptions  Medication Sig Dispense Refill  . aspirin 81 MG tablet Take 81 mg by mouth daily.    . carvedilol (COREG) 25 MG tablet Take 25 mg by mouth 2 (two) times daily with a meal.    . furosemide (LASIX) 20 MG tablet Take 20 mg by mouth daily.    Marland Kitchen glipiZIDE (GLUCOTROL) 5 MG tablet Take 2.5 mg by mouth daily.     . hydrALAZINE (APRESOLINE) 50 MG tablet Take 50 mg by mouth 2 (two) times daily.     Marland Kitchen losartan (COZAAR) 100 MG tablet Take 1 tablet (100 mg total) by mouth daily. 30 tablet 1  . metFORMIN (GLUCOPHAGE) 500 MG tablet Take 1,000 mg by mouth 2 (two) times daily with a meal.     . omeprazole (PRILOSEC) 20 MG  capsule Take 20 mg by mouth 2 (two) times daily.    . Polyvinyl Alcohol-Povidone (REFRESH OP) Place 1 drop into both eyes 2 (two) times daily between meals as needed (for dry eyes).    . pravastatin (PRAVACHOL) 40 MG tablet Take 40 mg by mouth daily.    Marland Kitchen spironolactone (ALDACTONE) 25 MG tablet Take 25 mg by mouth daily.     No current facility-administered medications for this visit.     Past Medical History  Diagnosis Date  . Hypertensive cardiomyopathy     By report there is poor compliance w/treatment, poor tolerance of treatment & poor symptom control. Symptoms include dyspnea & DOE.  . Essential hypertension, benign   . Obesity   . Hyperlipidemia, mixed   . Unspecified menopausal and postmenopausal disorder   . Bruit     Bilat carotid duplex 09/05/12 CONCLUSION: Mild, less than or equal to 39%, left internal carotid artery stenosis.  . Hypersomnia, unspecified   . CHF (congestive heart failure)     MPI 09/05/12 NORMAL, showed no scar & LVEF is 20-25%. ECHO 12/11/12 LV is markedly dilated, Overall LV systolic function severely impaired with EF = 25-30%, pseudonormal LV filling pattern (consistant with elevated LA pressure).  . PUD (peptic ulcer disease)   . 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 Diabetes mellitus, type 2     TYPE 2    Past Surgical History  Procedure Laterality Date  .  None    . Cardiac defibrillator placement  01/17/2014    DR ALLRED  . Left and right heart catheterization with coronary angiogram N/A 02/27/2013    Procedure: LEFT AND RIGHT HEART CATHETERIZATION WITH CORONARY ANGIOGRAM;  Surgeon: Rollene Rotunda, MD;  Location: [facility] CATH LAB;  Service: Cardiovascular;  Laterality: N/A;  . Implantable cardioverter defibrillator implant N/A 01/17/2014    Procedure: IMPLANTABLE CARDIOVERTER DEFIBRILLATOR IMPLANT;  Surgeon: Gardiner Rhyme, MD;  Location: [facility] CATH LAB;  Service: Cardiovascular;  Laterality: N/A;     History   Social History  . Marital Status: Married    Spouse Name: N/A    Number of Children: 5  . Years of Education: N/A   Occupational History  .      Retired   Social History Main Topics  . Smoking status: Never Smoker   . Smokeless tobacco: Never Used  . Alcohol Use: No  . Drug Use: No  . Sexual Activity: No   Other Topics Concern  . Not on file   Social History Narrative   Lives in [place] with husband and son.  Retired from Designer, fashion/clothing    ROS: no fevers or chills, productive cough, hemoptysis, dysphasia, odynophagia, melena, hematochezia, dysuria, hematuria, rash, seizure activity, orthopnea, PND, pedal edema, claudication. Remaining systems are negative.  Physical Exam: Well-developed well-nourished in no acute distress.  Skin is warm and dry.  HEENT is normal.  Neck is supple.  Chest is clear to auscultation with normal expansion. ICD left chest Cardiovascular exam is regular rate and rhythm.  Abdominal exam nontender or distended. No masses palpated. Extremities show trace ankle edema. neuro grossly intact  ECG sinus rhythm at a rate of 78. Left axis deviation. Left ventricular hypertrophy with repolarization abnormality.

## 2014-06-17 ENCOUNTER — Ambulatory Visit (INDEPENDENT_AMBULATORY_CARE_PROVIDER_SITE_OTHER): Payer: Medicare Other | Admitting: Cardiology

## 2014-06-17 ENCOUNTER — Encounter: Payer: Self-pay | Admitting: Cardiology

## 2014-06-17 VITALS — BP 170/70 | HR 78 | Ht 64.0 in | Wt 245.9 lb

## 2014-06-17 DIAGNOSIS — Z95810 Presence of automatic (implantable) cardiac defibrillator: Secondary | ICD-10-CM | POA: Insufficient documentation

## 2014-06-17 DIAGNOSIS — I420 Dilated cardiomyopathy: Secondary | ICD-10-CM

## 2014-06-17 DIAGNOSIS — I2510 Atherosclerotic heart disease of native coronary artery without angina pectoris: Secondary | ICD-10-CM

## 2014-06-17 DIAGNOSIS — I2584 Coronary atherosclerosis due to calcified coronary lesion: Secondary | ICD-10-CM

## 2014-06-17 DIAGNOSIS — I10 Essential (primary) hypertension: Secondary | ICD-10-CM

## 2014-06-17 LAB — BASIC METABOLIC PANEL WITH GFR
BUN: 12 mg/dL (ref 6–23)
CO2: 28 meq/L (ref 19–32)
Calcium: 9.8 mg/dL (ref 8.4–10.5)
Chloride: 105 mEq/L (ref 96–112)
Creat: 0.85 mg/dL (ref 0.50–1.10)
GFR, Est African American: 83 mL/min
GFR, Est Non African American: 72 mL/min
Glucose, Bld: 212 mg/dL — ABNORMAL HIGH (ref 70–99)
Potassium: 4.2 mEq/L (ref 3.5–5.3)
Sodium: 140 mEq/L (ref 135–145)

## 2014-06-17 MED ORDER — HYDRALAZINE HCL 50 MG PO TABS: 50.0000 mg | ORAL_TABLET | Freq: Three times a day (TID) | ORAL | Status: DC

## 2014-06-17 NOTE — Assessment & Plan Note (Signed)
Continue aspirin and statin. 

## 2014-06-17 NOTE — Patient Instructions (Addendum)
Your physician wants you to follow-up in: 6 MONTHS WITH DR Jens Som You will receive a reminder letter in the mail two months in advance. If you don't receive a letter, please call our office to schedule the follow-up appointment.   INCREASE HYDRALAZINE TO 50 MG THREE TIMES DAILY  Your physician recommends that you HAVE LAB WORK TODAY

## 2014-06-17 NOTE — Assessment & Plan Note (Signed)
Continue ARB, beta blocker and hydralazine. Intolerant to nitrates because of headaches.

## 2014-06-17 NOTE — Assessment & Plan Note (Signed)
Blood pressure elevated. Increase hydralazine to 50 mg by mouth 3 times a day. Check potassium and renal function.

## 2014-06-17 NOTE — Assessment & Plan Note (Signed)
Patient is doing reasonably well from a volume standpoint. Continue present dose of Lasix and spironolactone. Check potassium and renal function. I have asked her to take an additional Lasix daily for weight gain of 2-3 pounds. We discussed low-sodium diet.

## 2014-06-17 NOTE — Assessment & Plan Note (Signed)
Follow-up electrophysiology. 

## 2014-06-19 ENCOUNTER — Encounter: Payer: Self-pay | Admitting: *Deleted

## 2014-07-05 ENCOUNTER — Other Ambulatory Visit: Payer: Self-pay | Admitting: *Deleted

## 2014-07-05 MED ORDER — SPIRONOLACTONE 25 MG PO TABS: 25.0000 mg | ORAL_TABLET | Freq: Every day | ORAL | Status: DC

## 2014-07-08 ENCOUNTER — Encounter: Payer: Self-pay | Admitting: Internal Medicine

## 2014-07-15 ENCOUNTER — Telehealth: Payer: Self-pay | Admitting: Internal Medicine

## 2014-07-15 ENCOUNTER — Other Ambulatory Visit: Payer: Self-pay

## 2014-07-15 MED ORDER — PRAVASTATIN SODIUM 40 MG PO TABS: 40.0000 mg | ORAL_TABLET | Freq: Every day | ORAL | Status: DC

## 2014-07-15 MED ORDER — LOSARTAN POTASSIUM 100 MG PO TABS: 100.0000 mg | ORAL_TABLET | Freq: Every day | ORAL | Status: DC

## 2014-07-15 NOTE — Telephone Encounter (Signed)
Pt scheduled for 09-02-14 @ 1515 with JA.

## 2014-07-15 NOTE — Telephone Encounter (Signed)
New Msg       Pt returning call about letter received to have device checked.   Please return call.

## 2014-08-02 ENCOUNTER — Other Ambulatory Visit: Payer: Self-pay | Admitting: *Deleted

## 2014-08-02 MED ORDER — CARVEDILOL 25 MG PO TABS: 25.0000 mg | ORAL_TABLET | Freq: Two times a day (BID) | ORAL | Status: DC

## 2014-09-02 ENCOUNTER — Ambulatory Visit (INDEPENDENT_AMBULATORY_CARE_PROVIDER_SITE_OTHER): Payer: Medicare Other | Admitting: Internal Medicine

## 2014-09-02 ENCOUNTER — Encounter: Payer: Self-pay | Admitting: Internal Medicine

## 2014-09-02 VITALS — BP 158/62 | HR 84 | Ht 64.0 in | Wt 244.6 lb

## 2014-09-02 DIAGNOSIS — I509 Heart failure, unspecified: Secondary | ICD-10-CM | POA: Diagnosis not present

## 2014-09-02 DIAGNOSIS — I2584 Coronary atherosclerosis due to calcified coronary lesion: Secondary | ICD-10-CM

## 2014-09-02 DIAGNOSIS — I5022 Chronic systolic (congestive) heart failure: Secondary | ICD-10-CM | POA: Diagnosis not present

## 2014-09-02 DIAGNOSIS — I420 Dilated cardiomyopathy: Secondary | ICD-10-CM | POA: Diagnosis not present

## 2014-09-02 DIAGNOSIS — Z95810 Presence of automatic (implantable) cardiac defibrillator: Secondary | ICD-10-CM

## 2014-09-02 DIAGNOSIS — I110 Hypertensive heart disease with heart failure: Secondary | ICD-10-CM

## 2014-09-02 DIAGNOSIS — I5023 Acute on chronic systolic (congestive) heart failure: Secondary | ICD-10-CM

## 2014-09-02 DIAGNOSIS — I2510 Atherosclerotic heart disease of native coronary artery without angina pectoris: Secondary | ICD-10-CM

## 2014-09-02 LAB — MDC_IDC_ENUM_SESS_TYPE_INCLINIC
HighPow Impedance: 190 Ohm
HighPow Impedance: 72 Ohm
Lead Channel Impedance Value: 418 Ohm
Lead Channel Pacing Threshold Amplitude: 1 V
Lead Channel Pacing Threshold Pulse Width: 0.4 ms
Lead Channel Sensing Intrinsic Amplitude: 20.875 mV
Lead Channel Sensing Intrinsic Amplitude: 25.25 mV
Lead Channel Setting Pacing Pulse Width: 0.4 ms
Lead Channel Setting Sensing Sensitivity: 0.3 mV
MDC IDC MSMT BATTERY REMAINING LONGEVITY: 134 mo
MDC IDC MSMT BATTERY VOLTAGE: 3.06 V
MDC IDC SESS DTM: 20160418164901
MDC IDC SET LEADCHNL RV PACING AMPLITUDE: 2 V
MDC IDC SET ZONE DETECTION INTERVAL: 340 ms
MDC IDC STAT BRADY RV PERCENT PACED: 0 %
Zone Setting Detection Interval: 300 ms
Zone Setting Detection Interval: 360 ms

## 2014-09-02 MED ORDER — FUROSEMIDE 20 MG PO TABS: 40.0000 mg | ORAL_TABLET | Freq: Every day | ORAL | Status: DC

## 2014-09-02 NOTE — Patient Instructions (Signed)
Your physician recommends that you schedule a follow-up appointment in: 3 weeks with Anna Fredrickson, NP  Your physician wants you to follow-up in: 12 months with Anna Beltran Doe will receive a reminder letter in the mail two months in advance. If you don't receive a letter, please call our office to schedule the follow-up appointment.  Remote monitoring is used to monitor your Pacemaker or ICD from home. This monitoring reduces the number of office visits required to check your device to one time per year. It allows [place] to keep an eye on the functioning of your device to ensure it is working properly. You are scheduled for a device check from home on 12/02/14. You may send your transmission at any time that day. If you have a wireless device, the transmission will be sent automatically. After your physician reviews your transmission, you will receive a postcard with your next transmission date.  Anna Rad, RN will contact you regarding ICM follow up  Your physician has recommended you make the following change in your medication:  1) Increase Furosemide 40 mg daily  Low-Sodium Eating Plan Sodium raises blood pressure and causes water to be held in the body. Getting less sodium from food will help lower your blood pressure, reduce any swelling, and protect your heart, liver, and kidneys. We get sodium by adding salt (sodium chloride) to food. Most of our sodium comes from canned, boxed, and frozen foods. Restaurant foods, fast foods, and pizza are also very high in sodium. Even if you take medicine to lower your blood pressure or to reduce fluid in your body, getting less sodium from your food is important. WHAT IS MY PLAN? Most people should limit their sodium intake to 2,300 mg a day. Your health care provider recommends that you limit your sodium intake to 2 grams a day.  WHAT DO I NEED TO KNOW ABOUT THIS EATING PLAN? For the low-sodium eating plan, you will follow these general  guidelines:  Choose foods with a % Daily Value for sodium of less than 5% (as listed on the food label).   Use salt-free seasonings or herbs instead of table salt or sea salt.   Check with your health care provider or pharmacist before using salt substitutes.   Eat fresh foods.  Eat more vegetables and fruits.  Limit canned vegetables. If you do use them, rinse them well to decrease the sodium.   Limit cheese to 1 oz (28 g) per day.   Eat lower-sodium products, often labeled as "lower sodium" or "no salt added."  Avoid foods that contain monosodium glutamate (MSG). MSG is sometimes added to [place] food and some canned foods.  Check food labels (Nutrition Facts labels) on foods to learn how much sodium is in one serving.  Eat more home-cooked food and less restaurant, buffet, and fast food.  When eating at a restaurant, ask that your food be prepared with less salt or none, if possible.  HOW DO I READ FOOD LABELS FOR SODIUM INFORMATION? The Nutrition Facts label lists the amount of sodium in one serving of the food. If you eat more than one serving, you must multiply the listed amount of sodium by the number of servings. Food labels may also identify foods as:  Sodium free--Less than 5 mg in a serving.  Very low sodium--35 mg or less in a serving.  Low sodium--140 mg or less in a serving.  Light in sodium--50% less sodium in a serving. For example, if a food  that usually has 300 mg of sodium is changed to become light in sodium, it will have 150 mg of sodium.  Reduced sodium--25% less sodium in a serving. For example, if a food that usually has 400 mg of sodium is changed to reduced sodium, it will have 300 mg of sodium. WHAT FOODS CAN I EAT? Grains Low-sodium cereals, including oats, puffed wheat and rice, and shredded wheat cereals. Low-sodium crackers. Unsalted rice and pasta. Lower-sodium bread.  Vegetables Frozen or fresh vegetables. Low-sodium or  reduced-sodium canned vegetables. Low-sodium or reduced-sodium tomato sauce and paste. Low-sodium or reduced-sodium tomato and vegetable juices.  Fruits Fresh, frozen, and canned fruit. Fruit juice.  Meat and Other Protein Products Low-sodium canned tuna and salmon. Fresh or frozen meat, poultry, seafood, and fish. Lamb. Unsalted nuts. Dried beans, peas, and lentils without added salt. Unsalted canned beans. Homemade soups without salt. Eggs.  Dairy Milk. Soy milk. Ricotta cheese. Low-sodium or reduced-sodium cheeses. Yogurt.  Condiments Fresh and dried herbs and spices. Salt-free seasonings. Onion and garlic powders. Low-sodium varieties of mustard and ketchup. Lemon juice.  Fats and Oils Reduced-sodium salad dressings. Unsalted butter.  Other Unsalted popcorn and pretzels.  The items listed above may not be a complete list of recommended foods or beverages. Contact your dietitian for more options. WHAT FOODS ARE NOT RECOMMENDED? Grains Instant hot cereals. Bread stuffing, pancake, and biscuit mixes. Croutons. Seasoned rice or pasta mixes. Noodle soup cups. Boxed or frozen macaroni and cheese. Self-rising flour. Regular salted crackers. Vegetables Regular canned vegetables. Regular canned tomato sauce and paste. Regular tomato and vegetable juices. Frozen vegetables in sauces. Salted french fries. Olives. Rosita Fire. Relishes. Sauerkraut. Salsa. Meat and Other Protein Products Salted, canned, smoked, spiced, or pickled meats, seafood, or fish. Bacon, ham, sausage, hot dogs, corned beef, chipped beef, and packaged luncheon meats. Salt pork. Jerky. Pickled herring. Anchovies, regular canned tuna, and sardines. Salted nuts. Dairy Processed cheese and cheese spreads. Cheese curds. Blue cheese and cottage cheese. Buttermilk.  Condiments Onion and garlic salt, seasoned salt, table salt, and sea salt. Canned and packaged gravies. Worcestershire sauce. Tartar sauce. Barbecue sauce.  Teriyaki sauce. Soy sauce, including reduced sodium. Steak sauce. Fish sauce. Oyster sauce. Cocktail sauce. Horseradish. Regular ketchup and mustard. Meat flavorings and tenderizers. Bouillon cubes. Hot sauce. Tabasco sauce. Marinades. Taco seasonings. Relishes. Fats and Oils Regular salad dressings. Salted butter. Margarine. Ghee. Bacon fat.  Other Potato and tortilla chips. Corn chips and puffs. Salted popcorn and pretzels. Canned or dried soups. Pizza. Frozen entrees and pot pies.  The items listed above may not be a complete list of foods and beverages to avoid. Contact your dietitian for more information. Document Released: 10/23/2001 Document Revised: 05/08/2013 Document Reviewed: 03/07/2013 [facility] Patient Information 2015 [place], [place]. This information is not intended to replace advice given to you by your health care provider. Make sure you discuss any questions you have with your health care provider.

## 2014-09-02 NOTE — Progress Notes (Signed)
Electrophysiology Office Note   Date:  09/02/2014   ID:  Anna Beltran, DOB 10-Dec-1950, MRN [id number]  PCP:  Josue Hector, MD  Cardiologist:  Dr Jens Som Primary Electrophysiologist: Hillis Range, MD    Chief Complaint  Patient presents with  . Appointment    Congestive dilated cardiomyopathy     History of Present Illness: Anna Beltran is a 64 y.o. female who presents today for electrophysiology evaluation.   She has done well since her ICD was implanted.  She has SOB and edema chronically.  Today, she denies symptoms of palpitations, chest pain,  orthopnea, PND, claudication, dizziness, presyncope, syncope, bleeding, or neurologic sequela. The patient is tolerating medications without difficulties and is otherwise without complaint today.    Past Medical History  Diagnosis Date  . Hypertensive cardiomyopathy     By report there is poor compliance w/treatment, poor tolerance of treatment & poor symptom control. Symptoms include dyspnea & DOE.  . Essential hypertension, benign   . Obesity   . Hyperlipidemia, mixed   . Unspecified menopausal and postmenopausal disorder   . Bruit     Bilat carotid duplex 09/05/12 CONCLUSION: Mild, less than or equal to 39%, left internal carotid artery stenosis.  . Hypersomnia, unspecified   . CHF (congestive heart failure)     MPI 09/05/12 NORMAL, showed no scar & LVEF is 20-25%. ECHO 12/11/12 LV is markedly dilated, Overall LV systolic function severely impaired with EF = 25-30%, pseudonormal LV filling pattern (consistant with elevated LA pressure).  . PUD (peptic ulcer disease)   . 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 Diabetes mellitus, type 2     TYPE 2   Past Surgical History  Procedure Laterality Date  . None    . Cardiac defibrillator placement  01/17/2014    DR Jefte Carithers  . Left and right heart catheterization with coronary angiogram N/A 02/27/2013    Procedure: LEFT AND  RIGHT HEART CATHETERIZATION WITH CORONARY ANGIOGRAM;  Surgeon: Rollene Rotunda, MD;  Location: [facility] CATH LAB;  Service: Cardiovascular;  Laterality: N/A;  . Implantable cardioverter defibrillator implant N/A 01/17/2014    Procedure: IMPLANTABLE CARDIOVERTER DEFIBRILLATOR IMPLANT;  Surgeon: Gardiner Rhyme, MD;  Location: [facility] CATH LAB;  Service: Cardiovascular;  Laterality: N/A;     Current Outpatient Prescriptions  Medication Sig Dispense Refill  . aspirin 81 MG tablet Take 81 mg by mouth daily.    . carvedilol (COREG) 25 MG tablet Take 1 tablet (25 mg total) by mouth 2 (two) times daily with a meal. 60 tablet 5  . furosemide (LASIX) 20 MG tablet Take 20 mg by mouth daily.    Marland Kitchen glipiZIDE (GLUCOTROL) 5 MG tablet Take 2.5 mg by mouth daily.     . hydrALAZINE (APRESOLINE) 50 MG tablet Take 1 tablet (50 mg total) by mouth 3 (three) times daily. 90 tablet 3  . losartan (COZAAR) 100 MG tablet Take 1 tablet (100 mg total) by mouth daily. 30 tablet 6  . metFORMIN (GLUCOPHAGE) 500 MG tablet Take 1,000 mg by mouth 2 (two) times daily with a meal.     . omeprazole (PRILOSEC) 20 MG capsule Take 20 mg by mouth 2 (two) times daily.    Marland Kitchen OVER THE COUNTER MEDICATION Take 1 tablet by mouth daily. Equate brand allergy    . Polyvinyl Alcohol-Povidone (REFRESH OP) Place 1 drop into both eyes 2 (two) times daily as needed (for dry eyes).     Marland Kitchen  pravastatin (PRAVACHOL) 40 MG tablet Take 1 tablet (40 mg total) by mouth daily. 30 tablet 6  . spironolactone (ALDACTONE) 25 MG tablet Take 1 tablet (25 mg total) by mouth daily. 90 tablet 1   No current facility-administered medications for this visit.    Allergies:   Isosorbide   Social History:  The patient  reports that she has never smoked. She has never used smokeless tobacco. She reports that she does not drink alcohol or use illicit drugs.   Family History:  The patient's family history includes Cancer in her brother; Diabetes Mellitus II in her mother; Hypertension  in her father.    ROS:  Please see the history of present illness.   All other systems are reviewed and negative.    PHYSICAL EXAM: VS:  BP 158/62 mmHg  Pulse 84  Ht 5\' 4"  (1.626 m)  Wt 244 lb 9.6 oz (110.95 kg)  BMI 41.96 kg/m2 , BMI Body mass index is 41.96 kg/(m^2). GEN: Well nourished, well developed, in no acute distress HEENT: normal Neck: no JVD, carotid bruits, or masses Cardiac: RRR; no murmurs, rubs, or gallops, +1 edema  Respiratory:  Bibasilar rales, normal work of breathing GI: soft, nontender, nondistended, + BS MS: no deformity or atrophy Skin: warm and dry, device pocket is well healed Neuro:  Strength and sensation are intact Psych: euthymic mood, full affect  Device interrogation is reviewed today in detail.  See PaceArt for details.  Recent Labs: 11/10/2013: ALT 87* 01/07/2014: Hemoglobin 13.2; Platelets 236.0 06/17/2014: BUN 12; Creatinine 0.85; Potassium 4.2; Sodium 140    Lipid Panel  No results found for: CHOL, TRIG, HDL, CHOLHDL, VLDL, LDLCALC, LDLDIRECT   Wt Readings from Last 3 Encounters:  09/02/14 244 lb 9.6 oz (110.95 kg)  06/17/14 245 lb 14.4 oz (111.54 kg)  01/18/14 241 lb 2.9 oz (109.399 kg)      Other studies Reviewed: Additional studies/ records that were reviewed today include: Dr Waunita Schooner notes   ASSESSMENT AND PLAN:  1.  Hypertensive cardiomyopathy/ acute on chronic systolic dysfunction She is volume overloaded today Will increase lasix to 40mg  daily 2 gram sodium diet Enroll in ICM device clinic with Sherri Rad Return to see NP in 3 weeks Normal ICD function See Pace Art report No changes today   2. Hypertension Sodium restriction Lasix increased  3. Obesity Weight loss is strongly encouraged Regular exercise is encouraged  carelink Return to see me in 1 year Return to see NP in 3 weeks Follow-up with Dr Jens Som as scheduled  Current medicines are reviewed at length with the patient today.   The patient  does not have concerns regarding her medicines.  The following changes were made today:  none  Signed, Hillis Range, MD  09/02/2014 3:48 PM     [facility] [address] [place] [address] [phone] (office) [phone] (fax)

## 2014-09-23 ENCOUNTER — Encounter (HOSPITAL_COMMUNITY): Payer: Self-pay | Admitting: Emergency Medicine

## 2014-09-23 ENCOUNTER — Emergency Department (HOSPITAL_COMMUNITY)
Admission: EM | Admit: 2014-09-23 | Discharge: 2014-09-23 | Disposition: A | Discharge status: Home / Self Care | Payer: Medicare Other | Attending: Emergency Medicine | Admitting: Emergency Medicine

## 2014-09-23 ENCOUNTER — Emergency Department (HOSPITAL_COMMUNITY): Payer: Medicare Other

## 2014-09-23 DIAGNOSIS — R0981 Nasal congestion: Secondary | ICD-10-CM | POA: Insufficient documentation

## 2014-09-23 DIAGNOSIS — Z7982 Long term (current) use of aspirin: Secondary | ICD-10-CM | POA: Diagnosis not present

## 2014-09-23 DIAGNOSIS — R0602 Shortness of breath: Secondary | ICD-10-CM

## 2014-09-23 DIAGNOSIS — E669 Obesity, unspecified: Secondary | ICD-10-CM | POA: Insufficient documentation

## 2014-09-23 DIAGNOSIS — E119 Type 2 diabetes mellitus without complications: Secondary | ICD-10-CM | POA: Insufficient documentation

## 2014-09-23 DIAGNOSIS — Z95810 Presence of automatic (implantable) cardiac defibrillator: Secondary | ICD-10-CM | POA: Diagnosis not present

## 2014-09-23 DIAGNOSIS — Z9889 Other specified postprocedural states: Secondary | ICD-10-CM | POA: Diagnosis not present

## 2014-09-23 DIAGNOSIS — R05 Cough: Secondary | ICD-10-CM | POA: Insufficient documentation

## 2014-09-23 DIAGNOSIS — Z780 Asymptomatic menopausal state: Secondary | ICD-10-CM | POA: Diagnosis not present

## 2014-09-23 DIAGNOSIS — I509 Heart failure, unspecified: Secondary | ICD-10-CM | POA: Diagnosis not present

## 2014-09-23 DIAGNOSIS — R062 Wheezing: Secondary | ICD-10-CM

## 2014-09-23 DIAGNOSIS — R0600 Dyspnea, unspecified: Secondary | ICD-10-CM | POA: Insufficient documentation

## 2014-09-23 DIAGNOSIS — Z8711 Personal history of peptic ulcer disease: Secondary | ICD-10-CM | POA: Insufficient documentation

## 2014-09-23 DIAGNOSIS — E782 Mixed hyperlipidemia: Secondary | ICD-10-CM | POA: Insufficient documentation

## 2014-09-23 DIAGNOSIS — I110 Hypertensive heart disease with heart failure: Secondary | ICD-10-CM | POA: Insufficient documentation

## 2014-09-23 DIAGNOSIS — Z79899 Other long term (current) drug therapy: Secondary | ICD-10-CM | POA: Diagnosis not present

## 2014-09-23 DIAGNOSIS — R197 Diarrhea, unspecified: Secondary | ICD-10-CM | POA: Diagnosis not present

## 2014-09-23 DIAGNOSIS — R110 Nausea: Secondary | ICD-10-CM | POA: Insufficient documentation

## 2014-09-23 LAB — BASIC METABOLIC PANEL WITH GFR
Anion gap: 12 (ref 5–15)
BUN: 27 mg/dL — ABNORMAL HIGH (ref 6–20)
CO2: 25 mmol/L (ref 22–32)
Calcium: 9.5 mg/dL (ref 8.9–10.3)
Chloride: 99 mmol/L — ABNORMAL LOW (ref 101–111)
Creatinine, Ser: 1.53 mg/dL — ABNORMAL HIGH (ref 0.44–1.00)
GFR calc Af Amer: 40 mL/min — ABNORMAL LOW
GFR calc non Af Amer: 35 mL/min — ABNORMAL LOW
Glucose, Bld: 174 mg/dL — ABNORMAL HIGH (ref 70–99)
Potassium: 4 mmol/L (ref 3.5–5.1)
Sodium: 136 mmol/L (ref 135–145)

## 2014-09-23 LAB — CBC
HCT: 39.6 % (ref 36.0–46.0)
Hemoglobin: 13.1 g/dL (ref 12.0–15.0)
MCH: 29.6 pg (ref 26.0–34.0)
MCHC: 33.1 g/dL (ref 30.0–36.0)
MCV: 89.6 fL (ref 78.0–100.0)
Platelets: 158 K/uL (ref 150–400)
RBC: 4.42 MIL/uL (ref 3.87–5.11)
RDW: 14.7 % (ref 11.5–15.5)
WBC: 4.4 K/uL (ref 4.0–10.5)

## 2014-09-23 LAB — BRAIN NATRIURETIC PEPTIDE: B Natriuretic Peptide: 4.8 pg/mL (ref 0.0–100.0)

## 2014-09-23 MED ORDER — ALBUTEROL (5 MG/ML) CONTINUOUS INHALATION SOLN
10.0000 mg/h | INHALATION_SOLUTION | RESPIRATORY_TRACT | Status: DC
  Administered 2014-09-23: 10 mg/h via RESPIRATORY_TRACT
  Filled 2014-09-23: qty 20

## 2014-09-23 MED ORDER — PREDNISONE 20 MG PO TABS: 60.0000 mg | ORAL_TABLET | Freq: Every day | ORAL | Status: DC

## 2014-09-23 MED ORDER — PREDNISONE 20 MG PO TABS
60.0000 mg | ORAL_TABLET | Freq: Once | ORAL | Status: AC
  Administered 2014-09-23: 60 mg via ORAL
  Filled 2014-09-23: qty 3

## 2014-09-23 MED ORDER — IPRATROPIUM BROMIDE 0.02 % IN SOLN
1.0000 mg | Freq: Once | RESPIRATORY_TRACT | Status: AC
  Administered 2014-09-23: 1 mg via RESPIRATORY_TRACT
  Filled 2014-09-23: qty 5

## 2014-09-23 MED ORDER — MAGNESIUM SULFATE 2 GM/50ML IV SOLN
2.0000 g | Freq: Once | INTRAVENOUS | Status: AC
  Administered 2014-09-23: 2 g via INTRAVENOUS
  Filled 2014-09-23: qty 50

## 2014-09-23 MED ORDER — AZITHROMYCIN 250 MG PO TABS: 250.0000 mg | ORAL_TABLET | Freq: Every day | ORAL | Status: DC

## 2014-09-23 MED ORDER — AZITHROMYCIN 250 MG PO TABS
500.0000 mg | ORAL_TABLET | Freq: Once | ORAL | Status: AC
  Administered 2014-09-23: 500 mg via ORAL
  Filled 2014-09-23: qty 2

## 2014-09-23 NOTE — ED Provider Notes (Signed)
CSN: [id number]     Arrival date & time 09/23/14  0435 History   First MD Initiated Contact with Patient 09/23/14 0457     Chief Complaint  Patient presents with  . Shortness of Breath  . Nasal Congestion  . Nausea  . Diarrhea     (Consider location/radiation/quality/duration/timing/severity/associated sxs/prior Treatment) HPI Anna Beltran is a 64 y.o. female with past medical history of hypertensive cardiomyopathy ejection fraction 20%, hypertension, hyperlipidemia, diabetes presenting today with viral URI like symptoms. Patient states for the past 3 days she's had chest congestion, productive cough of green sputum, shortness of breath and wheezing. She denies any history of COPD and states that wheezing is new for her. She's had multiple sick contacts in grandchildren at home, they all have fevers and viral URI like symptoms. Patient has not taken anything during the interval to help her feel better. She is denying any exertional chest pain. Patient has no further complaints.  10 Systems reviewed and are negative for acute change except as noted in the HPI.    Past Medical History  Diagnosis Date  . Hypertensive cardiomyopathy     By report there is poor compliance w/treatment, poor tolerance of treatment & poor symptom control. Symptoms include dyspnea & DOE.  . Essential hypertension, benign   . Obesity   . Hyperlipidemia, mixed   . Unspecified menopausal and postmenopausal disorder   . Bruit     Bilat carotid duplex 09/05/12 CONCLUSION: Mild, less than or equal to 39%, left internal carotid artery stenosis.  . Hypersomnia, unspecified   . CHF (congestive heart failure)     MPI 09/05/12 NORMAL, showed no scar & LVEF is 20-25%. ECHO 12/11/12 LV is markedly dilated, Overall LV systolic function severely impaired with EF = 25-30%, pseudonormal LV filling pattern (consistant with elevated LA pressure).  . PUD (peptic ulcer disease)   . Automatic implantable cardioverter-defibrillator in  situ 01/17/2014    BY DR ALLRED  . Diabetes mellitus, type 2     TYPE 2   Past Surgical History  Procedure Laterality Date  . None    . Cardiac defibrillator placement  01/17/2014    DR ALLRED  . Left and right heart catheterization with coronary angiogram N/A 02/27/2013    Procedure: LEFT AND RIGHT HEART CATHETERIZATION WITH CORONARY ANGIOGRAM;  Surgeon: Rollene Rotunda, MD;  [facility] CATH LAB;  Service: Cardiovascular;  Laterality: N/A;  . Implantable cardioverter defibrillator implant N/A 01/17/2014    Procedure: IMPLANTABLE CARDIOVERTER DEFIBRILLATOR IMPLANT;  Surgeon: Gardiner Rhyme, MD;  Location: [facility] CATH LAB;  Service: Cardiovascular;  Laterality: N/A;   Family History  Problem Relation Age of Onset  . Diabetes Mellitus II Mother   . Cancer Brother     Colon  . Hypertension Father    History  Substance Use Topics  . Smoking status: Never Smoker   . Smokeless tobacco: Never Used  . Alcohol Use: No   OB History    No data available     Review of Systems    Allergies  Isosorbide  Home Medications   Prior to Admission medications   Medication Sig Start Date End Date Taking? Authorizing Provider  aspirin 81 MG tablet Take 81 mg by mouth daily.   Yes Historical Provider, MD  carvedilol (COREG) 25 MG tablet Take 1 tablet (25 mg total) by mouth 2 (two) times daily with a meal. 08/02/14  Yes Lewayne Bunting, MD  furosemide (LASIX) 20 MG tablet Take 2 tablets (  40 mg total) by mouth daily. 09/02/14  Yes Hillis Range, MD  glipiZIDE (GLUCOTROL) 5 MG tablet Take 2.5 mg by mouth daily.    Yes Historical Provider, MD  hydrALAZINE (APRESOLINE) 50 MG tablet Take 1 tablet (50 mg total) by mouth 3 (three) times daily. 06/17/14  Yes Lewayne Bunting, MD  losartan (COZAAR) 100 MG tablet Take 1 tablet (100 mg total) by mouth daily. 07/15/14  Yes Lewayne Bunting, MD  metFORMIN (GLUCOPHAGE) 500 MG tablet Take 1,000 mg by mouth 2 (two) times daily with a meal.    Yes Historical Provider,  MD  omeprazole (PRILOSEC) 20 MG capsule Take 20 mg by mouth 2 (two) times daily.   Yes Historical Provider, MD  OVER THE COUNTER MEDICATION Take 1 tablet by mouth daily. Equate brand allergy   Yes Historical Provider, MD  Polyvinyl Alcohol-Povidone (REFRESH OP) Place 1 drop into both eyes 2 (two) times daily as needed (for dry eyes).    Yes Historical Provider, MD  pravastatin (PRAVACHOL) 40 MG tablet Take 1 tablet (40 mg total) by mouth daily. 07/15/14  Yes Lewayne Bunting, MD  spironolactone (ALDACTONE) 25 MG tablet Take 1 tablet (25 mg total) by mouth daily. 07/05/14  Yes Lewayne Bunting, MD  azithromycin (ZITHROMAX) 250 MG tablet Take 1 tablet (250 mg total) by mouth daily. 09/23/14   Tomasita Crumble, MD  predniSONE (DELTASONE) 20 MG tablet Take 3 tablets (60 mg total) by mouth daily. 09/23/14   Tomasita Crumble, MD   BP 102/52 mmHg  Pulse 76  Temp(Src) 98.4 F (36.9 C) (Oral)  Resp 16  Ht 5\' 4"  (1.626 m)  Wt 245 lb (111.131 kg)  BMI 42.03 kg/m2  SpO2 100% Physical Exam  Constitutional: She is oriented to person, place, and time. She appears well-developed and well-nourished. No distress.  HENT:  Head: Normocephalic and atraumatic.  Nose: Nose normal.  Mouth/Throat: Oropharynx is clear and moist. No oropharyngeal exudate.  Eyes: Conjunctivae and EOM are normal. Pupils are equal, round, and reactive to light. No scleral icterus.  Neck: Normal range of motion. Neck supple. No JVD present. No tracheal deviation present. No thyromegaly present.  Cardiovascular: Normal rate, regular rhythm and normal heart sounds.  Exam reveals no gallop and no friction rub.   No murmur heard. Pulmonary/Chest: She is in respiratory distress. She has wheezes. She exhibits no tenderness.  Increased work of breathing noted.  Abdominal: Soft. Bowel sounds are normal. She exhibits no distension and no mass. There is no tenderness. There is no rebound and no guarding.  Musculoskeletal: Normal range of motion. She exhibits  no edema or tenderness.  Lymphadenopathy:    She has no cervical adenopathy.  Neurological: She is alert and oriented to person, place, and time. No cranial nerve deficit. She exhibits normal muscle tone.  Skin: Skin is warm and dry. No rash noted. No erythema. No pallor.  Nursing note and vitals reviewed.   ED Course  Procedures (including critical care time) Labs Review Labs Reviewed  BASIC METABOLIC PANEL - Abnormal; Notable for the following:    Chloride 99 (*)    Glucose, Bld 174 (*)    BUN 27 (*)    Creatinine, Ser 1.53 (*)    GFR calc non Af Amer 35 (*)    GFR calc Af Amer 40 (*)    All other components within normal limits  CBC  BRAIN NATRIURETIC PEPTIDE    Imaging Review Dg Chest 2 View  09/23/2014  CLINICAL DATA:  Shortness of breath and cough.  EXAM: CHEST  2 VIEW  COMPARISON:  01/18/2014  FINDINGS: Cardiac enlargement. Cardiac pacemaker. Pulmonary vascularity is normal. Lungs are clear. No blunting of costophrenic angles. No pneumothorax. Mediastinal contours appear intact.  IMPRESSION: Cardiac enlargement.  No evidence of active pulmonary disease.   Electronically Signed   By: Burman Nieves M.D.   On: 09/23/2014 05:41     EKG Interpretation   Date/Time:  Monday Sep 23 2014 04:42:52 EDT Ventricular Rate:  83 PR Interval:  165 QRS Duration: 104 QT Interval:  394 QTC Calculation: 463 R Axis:   -13 Text Interpretation:  Sinus rhythm Artifact in lead(s) I III aVR aVL aVF  V5 Confirmed by Erroll Luna [phone]) on 09/23/2014 5:02:32 AM      MDM   Final diagnoses:  SOB (shortness of breath)  Wheezing   patient presents emergency department for viral URI like symptoms for the past 3 days. Chest x-ray does not show any pneumonia. Laboratory studies are unremarkable. Patient was given hour-long breathing treatment, steroids, magnesium, azithromycin. Upon my repeat evaluation of the patient her wheezing has significantly improved, she only has minimal  expiratory wheezes intermittently. Patient is still finishing her hour-long continuous pretreatment. She'll be discharged home with 4 more days of prednisone and azithromycin.  He had no episodes of hypoxia here. She was 95% or greater throughout her emergency department stay. She is advised to follow-up with her primary care physician within 3 days for close follow-up. Return precautions given. Her vital signs were within her normal limits and the patient is safe for discharge.  CRITICAL CARE Performed by: Tomasita Crumble   Total critical care time: - resp distress  Critical care time was exclusive of separately billable procedures and treating other patients.  Critical care was necessary to treat or prevent imminent or life-threatening deterioration.  Critical care was time spent personally by me on the following activities: development of treatment plan with patient and/or surrogate as well as nursing, discussions with consultants, evaluation of patient's response to treatment, examination of patient, obtaining history from patient or surrogate, ordering and performing treatments and interventions, ordering and review of laboratory studies, ordering and review of radiographic studies, pulse oximetry and re-evaluation of patient's condition.   Tomasita Crumble, MD 09/23/14 [phone]

## 2014-09-23 NOTE — Discharge Instructions (Signed)
Upper Respiratory Infection, Adult Anna Beltran, see your primary care physician within 3 days. Take antibiotics and steroids as directed. If symptoms worsen come back to emergency department immediately. Thank you. An upper respiratory infection (URI) is also known as the common cold. It is often caused by a type of germ (virus). Colds are easily spread (contagious). You can pass it to others by kissing, coughing, sneezing, or drinking out of the same glass. Usually, you get better in 1 or 2 weeks.  HOME CARE   Only take medicine as told by your doctor.  Use a warm mist humidifier or breathe in steam from a hot shower.  Drink enough water and fluids to keep your pee (urine) clear or pale yellow.  Get plenty of rest.  Return to work when your temperature is back to normal or as told by your doctor. You may use a face mask and wash your hands to stop your cold from spreading. GET HELP RIGHT AWAY IF:   After the first few days, you feel you are getting worse.  You have questions about your medicine.  You have chills, shortness of breath, or brown or red spit (mucus).  You have yellow or brown snot (nasal discharge) or pain in the face, especially when you bend forward.  You have a fever, puffy (swollen) neck, pain when you swallow, or white spots in the back of your throat.  You have a bad headache, ear pain, sinus pain, or chest pain.  You have a high-pitched whistling sound when you breathe in and out (wheezing).  You have a lasting cough or cough up blood.  You have sore muscles or a stiff neck. MAKE SURE YOU:   Understand these instructions.  Will watch your condition.  Will get help right away if you are not doing well or get worse. Document Released: 10/20/2007 Document Revised: 07/26/2011 Document Reviewed: 08/08/2013 [facility] Patient Information 2015 [place], [place]. This information is not intended to replace advice given to you by your health care provider. Make sure you  discuss any questions you have with your health care provider. Bronchospasm A bronchospasm is when the tubes that carry air in and out of your lungs (airways) spasm or tighten. During a bronchospasm it is hard to breathe. This is because the airways get smaller. A bronchospasm can be triggered by:  Allergies. These may be to animals, pollen, food, or mold.  Infection. This is a common cause of bronchospasm.  Exercise.  Irritants. These include pollution, cigarette smoke, strong odors, aerosol sprays, and paint fumes.  Weather changes.  Stress.  Being emotional. HOME CARE   Always have a plan for getting help. Know when to call your doctor and local emergency services (911 in the [place] Know where you can get emergency care.  Only take medicines as told by your doctor.  If you were prescribed an inhaler or nebulizer machine, ask your doctor how to use it correctly. Always use a spacer with your inhaler if you were given one.  Stay calm during an attack. Try to relax and breathe more slowly.  Control your home environment:  Change your heating and air conditioning filter at least once a month.  Limit your use of fireplaces and wood stoves.  Do not  smoke. Do not  allow smoking in your home.  Avoid perfumes and fragrances.  Get rid of pests (such as roaches and mice) and their droppings.  Throw away plants if you see mold on them.  Keep  your house clean and dust free.  Replace carpet with wood, tile, or vinyl flooring. Carpet can trap dander and dust.  Use allergy-proof pillows, mattress covers, and box spring covers.  Wash bed sheets and blankets every week in hot water. Dry them in a dryer.  Use blankets that are made of polyester or cotton.  Wash hands frequently. GET HELP IF:  You have muscle aches.  You have chest pain.  The thick spit you spit or cough up (sputum) changes from clear or white to yellow, green, gray, or bloody.  The thick spit you spit or  cough up gets thicker.  There are problems that may be related to the medicine you are given such as:  A rash.  Itching.  Swelling.  Trouble breathing. GET HELP RIGHT AWAY IF:  You feel you cannot breathe or catch your breath.  You cannot stop coughing.  Your treatment is not helping you breathe better.  You have very bad chest pain. MAKE SURE YOU:   Understand these instructions.  Will watch your condition.  Will get help right away if you are not doing well or get worse. Document Released: 02/28/2009 Document Revised: 05/08/2013 Document Reviewed: 10/24/2012 [facility] Patient Information 2015 [place], [place]. This information is not intended to replace advice given to you by your health care provider. Make sure you discuss any questions you have with your health care provider.

## 2014-09-23 NOTE — ED Notes (Signed)
Pt reports nausea, vomiting, diarrhea, chest congestion, productive cough and SOB since Friday morning. Pt also reports chills, but has not checked to see if she has fever.

## 2014-10-04 ENCOUNTER — Ambulatory Visit: Payer: Medicare Other | Admitting: Nurse Practitioner

## 2014-11-07 IMAGING — CR DG CHEST 1V PORT
1 series · 1 of 1 positions shown · non-contrast
Comparison: CT 07/23/2012.

CLINICAL DATA: Short of breath.

PORTABLE CHEST - 1 VIEW

[AP]
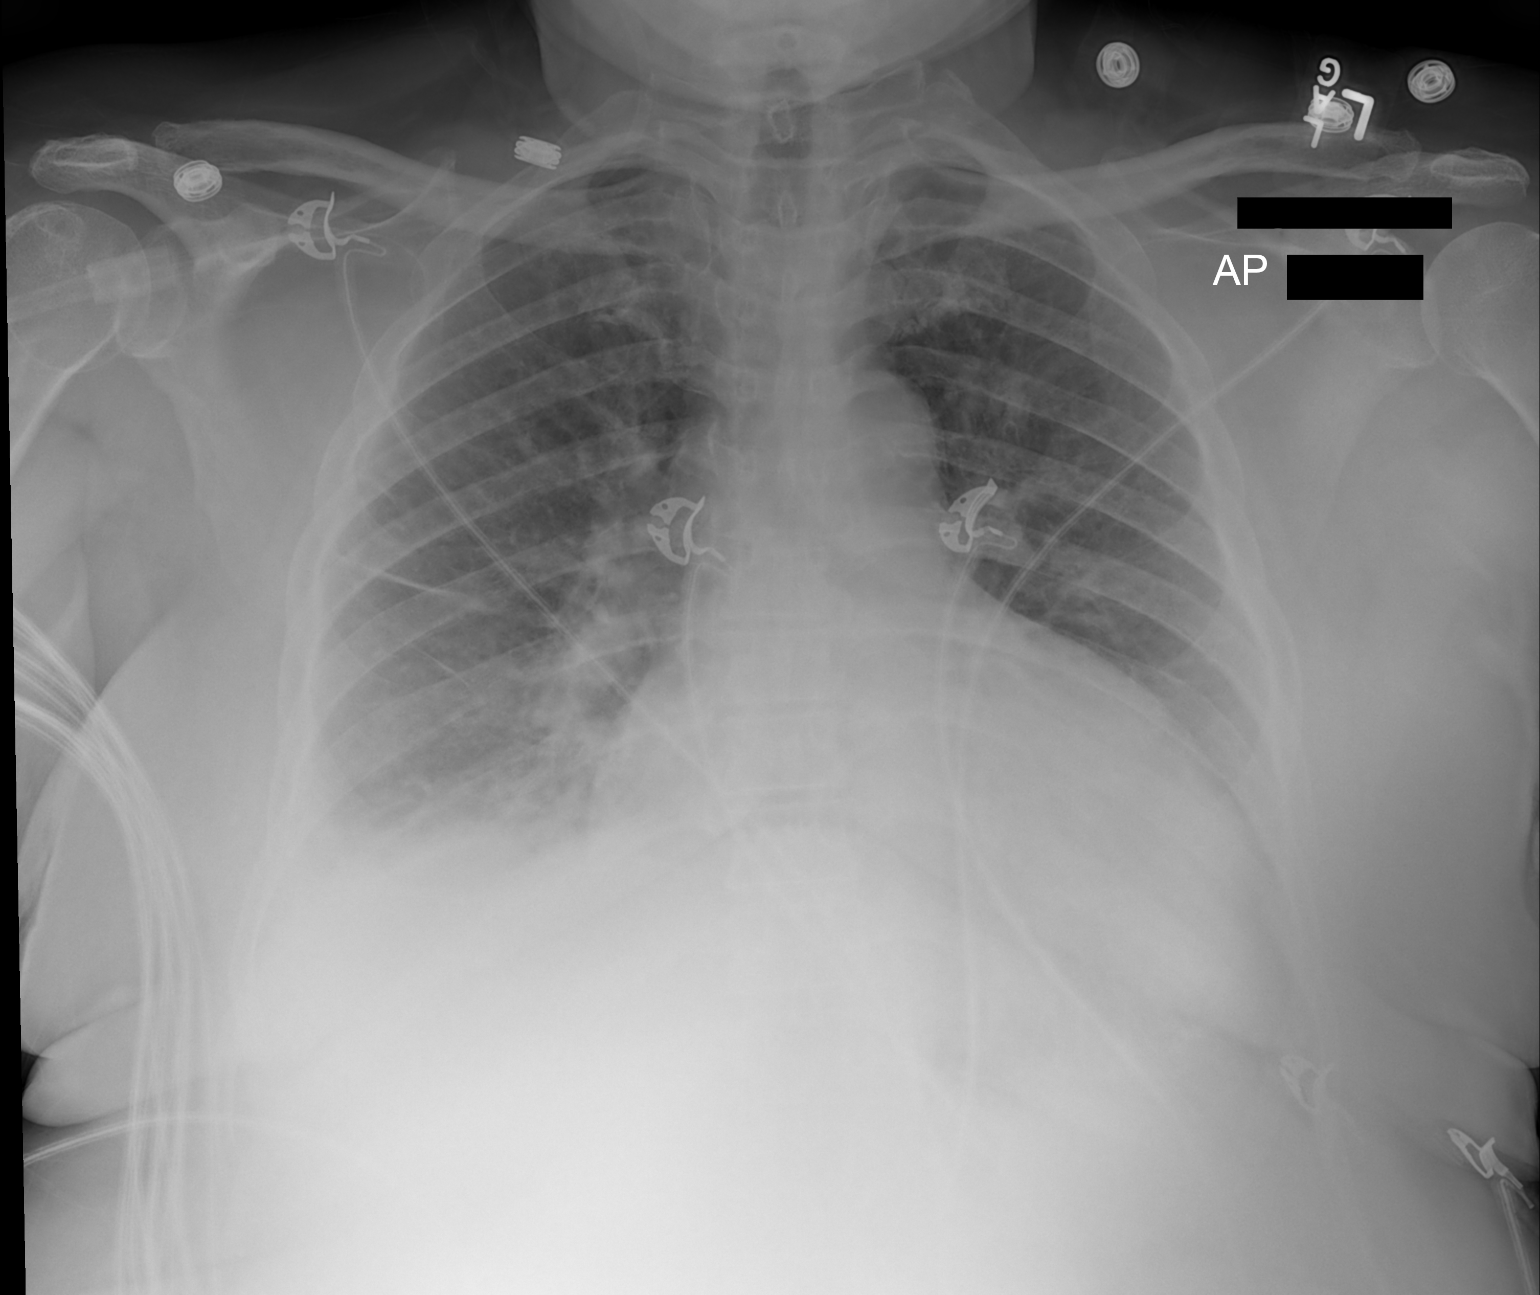

[1 of 1 positions shown; findings below may reference images not displayed]

FINDINGS: Right pleural effusion is present which is subpulmonic.
Mild basilar predominant airspace disease is present along with
pulmonary vascular congestion consistent with pulmonary edema.
Collapse / consolidation of the right lung base.  No pneumothorax.
Cardiopericardial silhouette is mildly enlarged. Although
superimposed pneumonia is difficult to exclude, the constellation
of findings favors CHF.
IMPRESSION: Findings most compatible with mild to moderate CHF with right
pleural effusion and likely compressive atelectasis of the right
lower lobe.

## 2014-12-02 ENCOUNTER — Ambulatory Visit (INDEPENDENT_AMBULATORY_CARE_PROVIDER_SITE_OTHER): Payer: Medicare Other | Admitting: *Deleted

## 2014-12-02 DIAGNOSIS — I429 Cardiomyopathy, unspecified: Secondary | ICD-10-CM

## 2014-12-02 DIAGNOSIS — I5022 Chronic systolic (congestive) heart failure: Secondary | ICD-10-CM | POA: Diagnosis not present

## 2014-12-02 NOTE — Progress Notes (Signed)
Remote ICD transmission.   

## 2014-12-03 LAB — CUP PACEART REMOTE DEVICE CHECK
Battery Remaining Longevity: 133 mo
Battery Voltage: 3.04 V
Brady Statistic RV Percent Paced: 0 %
Date Time Interrogation Session: 20160718113825
HighPow Impedance: 71 Ohm
Lead Channel Impedance Value: 342 Ohm
Lead Channel Impedance Value: 418 Ohm
Lead Channel Pacing Threshold Amplitude: 1 V
Lead Channel Pacing Threshold Pulse Width: 0.4 ms
Lead Channel Sensing Intrinsic Amplitude: 17.75 mV
Lead Channel Setting Pacing Amplitude: 2 V
Lead Channel Setting Pacing Pulse Width: 0.4 ms
Lead Channel Setting Sensing Sensitivity: 0.3 mV
MDC IDC SET ZONE DETECTION INTERVAL: 300 ms
Zone Setting Detection Interval: 340 ms
Zone Setting Detection Interval: 360 ms

## 2014-12-20 ENCOUNTER — Encounter: Payer: Self-pay | Admitting: Cardiology

## 2014-12-24 ENCOUNTER — Encounter: Payer: Self-pay | Admitting: Internal Medicine

## 2015-02-24 ENCOUNTER — Encounter (HOSPITAL_COMMUNITY): Payer: Self-pay | Admitting: Emergency Medicine

## 2015-02-24 ENCOUNTER — Emergency Department (HOSPITAL_COMMUNITY): Payer: Medicare Other

## 2015-02-24 ENCOUNTER — Emergency Department (HOSPITAL_COMMUNITY)
Admission: EM | Admit: 2015-02-24 | Discharge: 2015-02-24 | Disposition: A | Discharge status: Home / Self Care | Payer: Medicare Other | Attending: Emergency Medicine | Admitting: Emergency Medicine

## 2015-02-24 DIAGNOSIS — Z8711 Personal history of peptic ulcer disease: Secondary | ICD-10-CM | POA: Insufficient documentation

## 2015-02-24 DIAGNOSIS — I509 Heart failure, unspecified: Secondary | ICD-10-CM | POA: Insufficient documentation

## 2015-02-24 DIAGNOSIS — K769 Liver disease, unspecified: Secondary | ICD-10-CM | POA: Diagnosis not present

## 2015-02-24 DIAGNOSIS — E119 Type 2 diabetes mellitus without complications: Secondary | ICD-10-CM | POA: Diagnosis not present

## 2015-02-24 DIAGNOSIS — N839 Noninflammatory disorder of ovary, fallopian tube and broad ligament, unspecified: Secondary | ICD-10-CM | POA: Diagnosis not present

## 2015-02-24 DIAGNOSIS — E785 Hyperlipidemia, unspecified: Secondary | ICD-10-CM | POA: Diagnosis not present

## 2015-02-24 DIAGNOSIS — M546 Pain in thoracic spine: Secondary | ICD-10-CM | POA: Diagnosis not present

## 2015-02-24 DIAGNOSIS — E669 Obesity, unspecified: Secondary | ICD-10-CM | POA: Insufficient documentation

## 2015-02-24 DIAGNOSIS — R9431 Abnormal electrocardiogram [ECG] [EKG]: Secondary | ICD-10-CM

## 2015-02-24 DIAGNOSIS — Z7982 Long term (current) use of aspirin: Secondary | ICD-10-CM | POA: Diagnosis not present

## 2015-02-24 DIAGNOSIS — I10 Essential (primary) hypertension: Secondary | ICD-10-CM | POA: Diagnosis not present

## 2015-02-24 DIAGNOSIS — Z79899 Other long term (current) drug therapy: Secondary | ICD-10-CM | POA: Diagnosis not present

## 2015-02-24 LAB — COMPREHENSIVE METABOLIC PANEL
ALT: 18 U/L (ref 14–54)
AST: 17 U/L (ref 15–41)
Albumin: 3.7 g/dL (ref 3.5–5.0)
Alkaline Phosphatase: 73 U/L (ref 38–126)
Anion gap: 9 (ref 5–15)
BUN: 9 mg/dL (ref 6–20)
CO2: 25 mmol/L (ref 22–32)
Calcium: 10 mg/dL (ref 8.9–10.3)
Chloride: 103 mmol/L (ref 101–111)
Creatinine, Ser: 0.77 mg/dL (ref 0.44–1.00)
Glucose, Bld: 222 mg/dL — ABNORMAL HIGH (ref 65–99)
Potassium: 3.5 mmol/L (ref 3.5–5.1)
Sodium: 137 mmol/L (ref 135–145)
TOTAL PROTEIN: 7.4 g/dL (ref 6.5–8.1)
Total Bilirubin: 0.7 mg/dL (ref 0.3–1.2)

## 2015-02-24 LAB — URINALYSIS, ROUTINE W REFLEX MICROSCOPIC
BILIRUBIN URINE: NEGATIVE
GLUCOSE, UA: 250 mg/dL — AB
Hgb urine dipstick: NEGATIVE
KETONES UR: NEGATIVE mg/dL
Leukocytes, UA: NEGATIVE
NITRITE: NEGATIVE
PH: 6.5 (ref 5.0–8.0)
Protein, ur: NEGATIVE mg/dL
Specific Gravity, Urine: 1.015 (ref 1.005–1.030)
Urobilinogen, UA: 0.2 mg/dL (ref 0.0–1.0)

## 2015-02-24 LAB — D-DIMER, QUANTITATIVE (NOT AT ARMC)

## 2015-02-24 LAB — CBC WITH DIFFERENTIAL/PLATELET
BASOS ABS: 0 10*3/uL (ref 0.0–0.1)
BASOS PCT: 0 %
EOS ABS: 0.1 10*3/uL (ref 0.0–0.7)
Eosinophils Relative: 2 %
HEMATOCRIT: 41.3 % (ref 36.0–46.0)
Hemoglobin: 13.7 g/dL (ref 12.0–15.0)
Lymphocytes Relative: 44 %
Lymphs Abs: 2.6 10*3/uL (ref 0.7–4.0)
MCH: 29.8 pg (ref 26.0–34.0)
MCHC: 33.2 g/dL (ref 30.0–36.0)
MCV: 90 fL (ref 78.0–100.0)
MONO ABS: 0.5 10*3/uL (ref 0.1–1.0)
Monocytes Relative: 9 %
NEUTROS ABS: 2.6 10*3/uL (ref 1.7–7.7)
NEUTROS PCT: 45 %
Platelets: 224 10*3/uL (ref 150–400)
RBC: 4.59 MIL/uL (ref 3.87–5.11)
RDW: 13.6 % (ref 11.5–15.5)
WBC: 5.8 10*3/uL (ref 4.0–10.5)

## 2015-02-24 LAB — I-STAT TROPONIN, ED: TROPONIN I, POC: 0 ng/mL (ref 0.00–0.08)

## 2015-02-24 LAB — LIPASE, BLOOD: LIPASE: 35 U/L (ref 22–51)

## 2015-02-24 MED ORDER — HYDROCODONE-ACETAMINOPHEN 5-325 MG PO TABS: 1.0000 | ORAL_TABLET | Freq: Four times a day (QID) | ORAL | Status: DC | PRN

## 2015-02-24 MED ORDER — HYDROCODONE-ACETAMINOPHEN 5-325 MG PO TABS
2.0000 | ORAL_TABLET | Freq: Once | ORAL | Status: AC
  Administered 2015-02-24: 2 via ORAL
  Filled 2015-02-24: qty 2

## 2015-02-24 NOTE — Discharge Instructions (Signed)
The CT scan shows a left ovary "lesion" that could be a cyst but you need an outpatient ultrasound to confirm this. There is also a lesion on your liver that will need MRI for further details. If your back pain worsens or you develop trouble breathing, chest pain or other new/concerning symptoms return to the ER. Otherwise follow up with your cardiologist and regular doctor in the next 1-2 days. The Norco (hydrocordone) is for pain, do not drive, drink alcohol or operate heavy machinery while using it.

## 2015-02-24 NOTE — ED Notes (Signed)
Pt arrives with R sided back pain ongoing since Thursday, states that she doesn't recall injury or heavy lifting. Seen at urgent care Saturday for possible kidney infection and was told no infection was present. Pt HTN 220/100 in triage.

## 2015-02-24 NOTE — ED Provider Notes (Signed)
CSN: [id number]     Arrival date & time 02/24/15  0622 History   First MD Initiated Contact with Patient 02/24/15 [phone]     Chief Complaint  Patient presents with  . Back Pain     (Consider location/radiation/quality/duration/timing/severity/associated sxs/prior Treatment) HPI  64 year old female presents with right-sided thoracic back pain that started 4 days ago. Initially the pain was coming and going but now is constant. She has taken Tylenol which seems to help some but then the pain comes right back. Patient denies any new urinary symptoms including no hematuria or dysuria. She went to urgent care couple days ago and had a negative urinalysis. Nothing makes the pain worse including movement. No pleuritic pain. No abdominal symptoms. No nausea, vomiting, or fevers. Patient's pain is about the same as when it's started in intensity. She describes as a dull ache. Denies a specific injuries. No midline pain and no weakness, numbness in her extremities. Rates her pain as a 6/10. No pain with eating.  Past Medical History  Diagnosis Date  . Hypertensive cardiomyopathy (HCC)     By report there is poor compliance w/treatment, poor tolerance of treatment & poor symptom control. Symptoms include dyspnea & DOE.  . Essential hypertension, benign   . Obesity   . Hyperlipidemia, mixed   . Unspecified menopausal and postmenopausal disorder   . Bruit     Bilat carotid duplex 09/05/12 CONCLUSION: Mild, less than or equal to 39%, left internal carotid artery stenosis.  . Hypersomnia, unspecified   . CHF (congestive heart failure) (HCC)     MPI 09/05/12 NORMAL, showed no scar & LVEF is 20-25%. ECHO 12/11/12 LV is markedly dilated, Overall LV systolic function severely impaired with EF = 25-30%, pseudonormal LV filling pattern (consistant with elevated LA pressure).  . PUD (peptic ulcer disease)   . 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 Diabetes mellitus,  type 2 (HCC)     TYPE 2   Past Surgical History  Procedure Laterality Date  . None    . Cardiac defibrillator placement  01/17/2014    DR ALLRED  . Left and right heart catheterization with coronary angiogram N/A 02/27/2013    Procedure: LEFT AND RIGHT HEART CATHETERIZATION WITH CORONARY ANGIOGRAM;  Surgeon: Rollene Rotunda, MD;  Location: [facility] CATH LAB;  Service: Cardiovascular;  Laterality: N/A;  . Implantable cardioverter defibrillator implant N/A 01/17/2014    Procedure: IMPLANTABLE CARDIOVERTER DEFIBRILLATOR IMPLANT;  Surgeon: Gardiner Rhyme, MD;  Location: [facility] CATH LAB;  Service: Cardiovascular;  Laterality: N/A;   Family History  Problem Relation Age of Onset  . Diabetes Mellitus II Mother   . Cancer Brother     Colon  . Hypertension Father    Social History  Substance Use Topics  . Smoking status: Never Smoker   . Smokeless tobacco: Never Used  . Alcohol Use: No   OB History    No data available     Review of Systems  Constitutional: Negative for fever.  Respiratory: Negative for shortness of breath.   Cardiovascular: Negative for chest pain.  Gastrointestinal: Negative for nausea, vomiting and abdominal pain.  Genitourinary: Negative for dysuria and hematuria.  Musculoskeletal: Positive for back pain.  Neurological: Negative for weakness and numbness.  All other systems reviewed and are negative.     Allergies  Isosorbide  Home Medications   Prior to Admission medications   Medication Sig Start Date End Date Taking? Authorizing Provider  aspirin 81  MG tablet Take 81 mg by mouth daily.    Historical Provider, MD  azithromycin (ZITHROMAX) 250 MG tablet Take 1 tablet (250 mg total) by mouth daily. 09/23/14   Tomasita Crumble, MD  carvedilol (COREG) 25 MG tablet Take 1 tablet (25 mg total) by mouth 2 (two) times daily with a meal. 08/02/14   Lewayne Bunting, MD  furosemide (LASIX) 20 MG tablet Take 2 tablets (40 mg total) by mouth daily. 09/02/14   Hillis Range, MD   glipiZIDE (GLUCOTROL) 5 MG tablet Take 2.5 mg by mouth daily.     Historical Provider, MD  hydrALAZINE (APRESOLINE) 50 MG tablet Take 1 tablet (50 mg total) by mouth 3 (three) times daily. 06/17/14   Lewayne Bunting, MD  losartan (COZAAR) 100 MG tablet Take 1 tablet (100 mg total) by mouth daily. 07/15/14   Lewayne Bunting, MD  metFORMIN (GLUCOPHAGE) 500 MG tablet Take 1,000 mg by mouth 2 (two) times daily with a meal.     Historical Provider, MD  omeprazole (PRILOSEC) 20 MG capsule Take 20 mg by mouth 2 (two) times daily.    Historical Provider, MD  OVER THE COUNTER MEDICATION Take 1 tablet by mouth daily. Equate brand allergy    Historical Provider, MD  Polyvinyl Alcohol-Povidone (REFRESH OP) Place 1 drop into both eyes 2 (two) times daily as needed (for dry eyes).     Historical Provider, MD  pravastatin (PRAVACHOL) 40 MG tablet Take 1 tablet (40 mg total) by mouth daily. 07/15/14   Lewayne Bunting, MD  predniSONE (DELTASONE) 20 MG tablet Take 3 tablets (60 mg total) by mouth daily. 09/23/14   Tomasita Crumble, MD  spironolactone (ALDACTONE) 25 MG tablet Take 1 tablet (25 mg total) by mouth daily. 07/05/14   Lewayne Bunting, MD   BP 177/77 mmHg  Pulse 78  Temp(Src) 97.6 F (36.4 C) (Oral)  Resp 22  Ht 5\' 4"  (1.626 m)  Wt 240 lb (108.863 kg)  BMI 41.18 kg/m2  SpO2 96% Physical Exam  Constitutional: She is oriented to person, place, and time. She appears well-developed and well-nourished. No distress.  obese  HENT:  Head: Normocephalic and atraumatic.  Right Ear: External ear normal.  Left Ear: External ear normal.  Nose: Nose normal.  Eyes: Right eye exhibits no discharge. Left eye exhibits no discharge.  Cardiovascular: Normal rate, regular rhythm and normal heart sounds.   Pulmonary/Chest: Effort normal and breath sounds normal.  Abdominal: Soft. She exhibits no distension. There is no tenderness. There is no CVA tenderness.  Musculoskeletal:       Cervical back: She exhibits no  tenderness.       Thoracic back: She exhibits no tenderness and no bony tenderness.       Lumbar back: She exhibits no tenderness and no bony tenderness.  Neurological: She is alert and oriented to person, place, and time.  Skin: Skin is warm and dry. She is not diaphoretic.  Nursing note and vitals reviewed.   ED Course  Procedures (including critical care time) Labs Review Labs Reviewed  URINALYSIS, ROUTINE W REFLEX MICROSCOPIC (NOT AT [facility]) - Abnormal; Notable for the following:    Glucose, UA 250 (*)    All other components within normal limits  COMPREHENSIVE METABOLIC PANEL - Abnormal; Notable for the following:    Glucose, Bld 222 (*)    All other components within normal limits  LIPASE, BLOOD  CBC WITH DIFFERENTIAL/PLATELET  D-DIMER, QUANTITATIVE (NOT AT [facility])  I-STAT TROPOININ,  ED    Imaging Review Ct Renal Stone Study  02/24/2015   CLINICAL DATA:  Low back pain starting Thursday. Nausea and vomiting.  EXAM: CT ABDOMEN AND PELVIS WITHOUT CONTRAST  TECHNIQUE: Multidetector CT imaging of the abdomen and pelvis was performed following the standard protocol without IV contrast.  COMPARISON:  Report from 10/26/2002 and overlapping images from CT chest 07/23/2012  FINDINGS: Lower chest: AICD lead noted. Mild cardiomegaly. The airway thickening in both lower lobes with mild cylindrical bronchiectasis in the right lower lobe. Mild atelectasis or scarring anteriorly in the left lower lobe along the major fissure.  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  Stomach/Bowel: Scattered colonic diverticula.  Appendix normal.  Vascular/Lymphatic: Aortoiliac atherosclerotic vascular disease.  Reproductive: 7.7 by 5.9 cm left adnexal/ ovarian lesion, fluid density.  Other: No supplemental non-categorized findings.   Musculoskeletal: Right greater than left sclerosis along the sacroiliac joints. Lumbar spondylosis and degenerative disc disease cause mild bilateral foraminal impingement at L5-S1 and borderline impingement at L3-4 and L4-5.  Small umbilical hernia contains adipose tissue.  IMPRESSION: 1. 7.7 cm left ovarian fluid density lesion, likely a cyst. Given the patient's age this requires further characterization. Dedicated pelvic sonography is recommended. 2. There is an indistinct hypodense lesion in segment 4B of the liver. On today's noncontrast exam this is nonspecific. This could represent focal fatty infiltration but malignancy is not excluded. Correlate with liver enzymes in consider hepatic protocol MRI with and without contrast. 3. Mild cardiomegaly. 4. Airway thickening both lower lobes suggesting bronchitis. Mild cylindrical bronchiectasis in the right lower lobe. 5. Scattered colonic diverticula without diverticulitis identified. 6.  Aortoiliac atherosclerotic vascular disease. 7. 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 8. Bilateral sacroiliitis, right greater than left. 9. Lumbar spondylosis and degenerative disc disease, causing mild bilateral foraminal stenosis at L5-S1.   Electronically Signed   By: Gaylyn Rong M.D.   On: 02/24/2015 08:34   I have personally reviewed and evaluated these images and lab results as part of my medical decision-making.   EKG Interpretation   Date/Time:  Monday February 24 2015 08:12:49 EDT Ventricular Rate:  68 PR Interval:  184 QRS Duration: 96 QT Interval:  418 QTC Calculation: 444 R Axis:   -71 Text Interpretation:  Normal sinus rhythm Left axis deviation Pulmonary  disease pattern ST \\T \ T wave abnormality, consider inferior ischemia  Abnormal ECG ST/T changes appear new compared to May 2016 Confirmed by  Criss Alvine  MD, Lilyona Richner ([id number]) on 02/24/2015 8:20:48 AM      MDM   Final diagnoses:  Right-sided thoracic back pain   Abnormal ECG  Liver lesion  Lesion of ovary    Patient with nonspecific right-sided thoracic back pain. Patient's pain has been constant now for the last couple days. Troponin is negative. There is no exertional component. She does have nonspecific ST changes that are new from several months ago. I discussed this with the cardiologist, Dr. Herbie Baltimore, who reviewed her EKG and feels that her symptoms will be extremely atypical for ACS with a negative troponin he recommended she follow-up with her cardiologist in the next 1 week. No evidence of renal stone or AAA that could be causing her back pain. Low risk for pulmonary embolism and has a negative d-dimer. Pain now completely resolved after Norco. Plan to treat with Norco and  have her follow up as soon as possible with her cardiologist and her PCP. I discussed the abnormal findings on the CT scan and encouraged close outpatient follow-up imaging.     Pricilla Loveless, MD 02/24/15 [phone]

## 2015-02-26 ENCOUNTER — Other Ambulatory Visit (HOSPITAL_COMMUNITY): Payer: Self-pay | Admitting: Adult Health Nurse Practitioner

## 2015-02-26 DIAGNOSIS — N83202 Unspecified ovarian cyst, left side: Secondary | ICD-10-CM

## 2015-02-26 DIAGNOSIS — K769 Liver disease, unspecified: Secondary | ICD-10-CM

## 2015-03-03 ENCOUNTER — Ambulatory Visit (INDEPENDENT_AMBULATORY_CARE_PROVIDER_SITE_OTHER): Payer: Medicare Other | Admitting: *Deleted

## 2015-03-03 DIAGNOSIS — I5022 Chronic systolic (congestive) heart failure: Secondary | ICD-10-CM | POA: Diagnosis not present

## 2015-03-03 DIAGNOSIS — I429 Cardiomyopathy, unspecified: Secondary | ICD-10-CM

## 2015-03-03 NOTE — Progress Notes (Signed)
Remote ICD transmission.   

## 2015-03-10 ENCOUNTER — Ambulatory Visit (HOSPITAL_COMMUNITY)
Admission: RE | Admit: 2015-03-10 | Discharge: 2015-03-10 | Disposition: A | Discharge status: Home / Self Care | Payer: Medicare Other | Source: Ambulatory Visit | Attending: Adult Health Nurse Practitioner | Admitting: Adult Health Nurse Practitioner

## 2015-03-10 ENCOUNTER — Other Ambulatory Visit (HOSPITAL_COMMUNITY): Payer: Self-pay | Admitting: Adult Health Nurse Practitioner

## 2015-03-10 ENCOUNTER — Other Ambulatory Visit (HOSPITAL_COMMUNITY): Payer: Medicare Other

## 2015-03-10 DIAGNOSIS — K769 Liver disease, unspecified: Secondary | ICD-10-CM

## 2015-03-10 DIAGNOSIS — N83202 Unspecified ovarian cyst, left side: Secondary | ICD-10-CM | POA: Diagnosis not present

## 2015-03-10 DIAGNOSIS — K7689 Other specified diseases of liver: Secondary | ICD-10-CM | POA: Insufficient documentation

## 2015-03-10 MED ORDER — BARIUM SULFATE 2.1 % PO SUSP
ORAL | Status: AC
  Filled 2015-03-10: qty 2

## 2015-03-13 ENCOUNTER — Ambulatory Visit: Payer: Medicare Other | Admitting: Physician Assistant

## 2015-03-18 ENCOUNTER — Ambulatory Visit (HOSPITAL_COMMUNITY)
Admission: RE | Admit: 2015-03-18 | Discharge: 2015-03-18 | Disposition: A | Discharge status: Home / Self Care | Payer: Medicare Other | Source: Ambulatory Visit | Attending: Adult Health Nurse Practitioner | Admitting: Adult Health Nurse Practitioner

## 2015-03-18 DIAGNOSIS — K760 Fatty (change of) liver, not elsewhere classified: Secondary | ICD-10-CM | POA: Diagnosis not present

## 2015-03-18 DIAGNOSIS — K5730 Diverticulosis of large intestine without perforation or abscess without bleeding: Secondary | ICD-10-CM | POA: Diagnosis not present

## 2015-03-18 DIAGNOSIS — R932 Abnormal findings on diagnostic imaging of liver and biliary tract: Secondary | ICD-10-CM | POA: Diagnosis present

## 2015-03-18 MED ORDER — IOHEXOL 300 MG/ML  SOLN
100.0000 mL | Freq: Once | INTRAMUSCULAR | Status: AC | PRN
  Administered 2015-03-18: 100 mL via INTRAVENOUS

## 2015-03-24 ENCOUNTER — Encounter: Payer: Self-pay | Admitting: Physician Assistant

## 2015-03-24 ENCOUNTER — Ambulatory Visit (INDEPENDENT_AMBULATORY_CARE_PROVIDER_SITE_OTHER): Payer: Medicare Other | Admitting: Physician Assistant

## 2015-03-24 VITALS — BP 176/84 | HR 64 | Ht 64.0 in | Wt 239.2 lb

## 2015-03-24 DIAGNOSIS — I5042 Chronic combined systolic (congestive) and diastolic (congestive) heart failure: Secondary | ICD-10-CM | POA: Diagnosis not present

## 2015-03-24 DIAGNOSIS — I2584 Coronary atherosclerosis due to calcified coronary lesion: Secondary | ICD-10-CM

## 2015-03-24 DIAGNOSIS — I2510 Atherosclerotic heart disease of native coronary artery without angina pectoris: Secondary | ICD-10-CM

## 2015-03-24 DIAGNOSIS — I10 Essential (primary) hypertension: Secondary | ICD-10-CM

## 2015-03-24 NOTE — Progress Notes (Signed)
Cardiology Office Note   Date:  03/24/2015   ID:  KEVIN BROWNELL, DOB 1950/08/19, MRN [id number]  PCP:  Josue Hector, MD  Cardiologist:  Dr Jens Som, Dr Rodena Goldmann, PA-C   Chief Complaint  Patient presents with  . ER follow-up    patient reports no complaints. recently in ER for back pain.    History of Present Illness: Anna Beltran is a 64 y.o. female with a history of S-D-CHF, NICM, ICD 01/2014.              Anna Beltran presents for ER follow-up after being seen for back pain. Her pain was in the thoracic area, ECG and troponin were negative, no further cardiac workup indicated.  Anna Beltran back pain has improved. She is busy on a regular basis because she cares for 3 grandchildren, ages 33, 14, and 5, on a daily basis and another grandchild after school. She never gets chest pain or shortness of breath when working with the grandchildren. She is not that active, and is encouraged to play more actively with them.  She does not walk for exercise anymore because of coyotes and bears in her neighborhood. She gardens and does housework, and does not get chest pain or dyspnea on exertion with this level of activity.  She checks her blood pressure occasionally at home and the systolic is generally in the 130s-140s, with a diastolic in the 80s. She admits that her blood pressure is high today, but has not yet had her medications because she didn't eat until right before she came to the office. She normally takes her medications in the morning with breakfast.  Past Medical History  Diagnosis Date  . Hypertensive cardiomyopathy (HCC)     By report there is poor compliance w/treatment, poor tolerance of treatment & poor symptom control. Symptoms include dyspnea & DOE.  . Essential hypertension, benign   . Obesity   . Hyperlipidemia, mixed   . Unspecified menopausal and postmenopausal disorder   . Bruit     Bilat carotid duplex 09/05/12 CONCLUSION: Mild, less than  or equal to 39%, left internal carotid artery stenosis.  . Hypersomnia, unspecified   . CHF (congestive heart failure) (HCC)     MPI 09/05/12 NORMAL, showed no scar & LVEF is 20-25%. ECHO 12/11/12 LV is markedly dilated, Overall LV systolic function severely impaired with EF = 25-30%, pseudonormal LV filling pattern (consistant with elevated LA pressure).  . PUD (peptic ulcer disease)   . 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 Diabetes mellitus, type 2 (HCC)     TYPE 2    Past Surgical History  Procedure Laterality Date  . None    . Cardiac defibrillator placement  01/17/2014    DR ALLRED  . Left and right heart catheterization with coronary angiogram N/A 02/27/2013    Procedure: LEFT AND RIGHT HEART CATHETERIZATION WITH CORONARY ANGIOGRAM;  Surgeon: Rollene Rotunda, MD;  Location: [facility] CATH LAB;  Service: Cardiovascular;  Laterality: N/A;  . Implantable cardioverter defibrillator implant N/A 01/17/2014    Procedure: IMPLANTABLE CARDIOVERTER DEFIBRILLATOR IMPLANT;  Surgeon: Gardiner Rhyme, MD;  Location: [facility] CATH LAB;  Service: Cardiovascular;  Laterality: N/A;    Current Outpatient Prescriptions  Medication Sig Dispense Refill  . aspirin 81 MG tablet Take 81 mg by mouth daily.    . carvedilol (COREG) 25 MG tablet Take 1 tablet (25 mg total) by mouth 2 (two) times  daily with a meal. 60 tablet 5  . furosemide (LASIX) 20 MG tablet Take 2 tablets (40 mg total) by mouth daily. 180 tablet 3  . glipiZIDE (GLUCOTROL XL) 10 MG 24 hr tablet Take 5 mg by mouth 2 (two) times daily.    . hydrALAZINE (APRESOLINE) 50 MG tablet Take 1 tablet (50 mg total) by mouth 3 (three) times daily. 90 tablet 3  . losartan (COZAAR) 100 MG tablet Take 1 tablet (100 mg total) by mouth daily. 30 tablet 6  . metFORMIN (GLUCOPHAGE) 500 MG tablet Take 1,000 mg by mouth 2 (two) times daily with a meal.     . omeprazole (PRILOSEC) 20 MG capsule Take 20 mg by mouth 2 (two) times  daily.    . Polyvinyl Alcohol-Povidone (REFRESH OP) Place 1 drop into both eyes 2 (two) times daily as needed (for dry eyes).     . pravastatin (PRAVACHOL) 40 MG tablet Take 1 tablet (40 mg total) by mouth daily. 30 tablet 6  . spironolactone (ALDACTONE) 25 MG tablet Take 1 tablet (25 mg total) by mouth daily. 90 tablet 1   No current facility-administered medications for this visit.    Allergies:   Isosorbide    Social History:  The patient  reports that she has never smoked. She has never used smokeless tobacco. She reports that she does not drink alcohol or use illicit drugs.   Family History:  The patient's family history includes Cancer in her brother; Diabetes Mellitus II in her mother; Hypertension in her father.    ROS:  Please see the history of present illness. All other systems are reviewed and negative.    PHYSICAL EXAM: VS:  BP 176/84 mmHg  Pulse 64  Ht  (1.626 m)  Wt 239 lb 3.2 oz (108.5 kg)  BMI 41.04 kg/m2 , BMI Body mass index is 41.04 kg/(m^2). GEN: Well nourished, well developed, female in no acute distress HEENT: normal for age  Neck: no JVD, ?R carotid bruit, no masses Cardiac: RRR; soft murmur, no rubs, or gallops Respiratory:  clear to auscultation bilaterally, normal work of breathing GI: soft, nontender, nondistended, + BS MS: no deformity or atrophy; trace edema; distal pulses are 2+ in all 4 extremities  Skin: warm and dry, no rash Neuro:  Strength and sensation are intact Psych: euthymic mood, full affect   EKG:  EKG is not ordered today.  Radiology:  US Transvaginal Non-ob 03/10/2015  CLINICAL DATA:  Reason discovery of a left ovarian cyst on CT scan of February 24, 2015 EXAM: TRANSABDOMINAL AND TRANSVAGINAL ULTRASOUND OF PELVIS TECHNIQUE: Both transabdominal and transvaginal ultrasound examinations of the pelvis were performed. Transabdominal technique was performed for global imaging of the pelvis including uterus, ovaries, adnexal regions,  and pelvic cul-de-sac. It was necessary to proceed with endovaginal exam following the transabdominal exam to visualize the endometrium and ovaries. COMPARISON:  Abdominal and pelvic CT scan dated February 24, 2015 FINDINGS: Uterus Measurements: 8.7 x 4.3 x 4.5 cm. No fibroids or other mass visualized. Nabothian cysts are suspected. Endometrium Thickness: 5.1-6 mm where visualized. No focal abnormality visualized. Right ovary Measurements: 3.1 x 1.7 x 1.6 cm. Normal appearance/no adnexal mass. Left ovary Measurements: 7.3 x 5.7 x 8.5 cm. The left ovary contains a cystic structure measuring 6.8 x 5.7 x 7.7 cm. No mural nodules or calcifications are observed. Vascularity does not appear increased. Other findings No free fluid. IMPRESSION: 1. There is a simple appearing 7.7 x 5.7 x 6.8 cm left  ovarian cyst. Given the patient's age and postmenopausal status, MRI is recommended. 2. The uterus and right ovary are normal in appearance for age. Electronically Signed   By: David  [place] M.D.   On: 03/10/2015 10:37   Ct Abd Wo & W Cm 03/18/2015  CLINICAL DATA:  64 year old female with history of liver lesion noted on prior noncontrast CT examination. Followup study. EXAM: CT ABDOMEN WITHOUT AND WITH CONTRAST TECHNIQUE: Multidetector CT imaging of the abdomen was performed following the standard protocol before and following the bolus administration of intravenous contrast. CONTRAST:  OMNIPAQUE IOHEXOL 300 MG/ML  SOLN COMPARISON:  CT the abdomen and pelvis 02/24/2015. FINDINGS: Lower chest: Pacemaker lead terminating in the right ventricular apex. Mild cardiomegaly. Bronchial wall thickening and areas of mild cylindrical bronchiectasis in the lung bases bilaterally. Hepatobiliary: Diffuse low attenuation throughout the hepatic parenchyma, compatible with a background of hepatic steatosis. In segment 4B of the liver there is a more focal area of low attenuation, which does not demonstrate abnormal perfusion or  enhancement on any of the postcontrast phases, most compatible with a more focal area of severe fatty infiltration. No other suspicious appearing cystic or solid hepatic lesions are noted. No intra or extrahepatic biliary ductal dilatation. Gallbladder is normal in appearance. Pancreas: No pancreatic mass. No pancreatic ductal dilatation. No pancreatic or peripancreatic fluid or inflammatory changes. Spleen: Unremarkable. Adrenals/Urinary Tract: Bilateral kidneys and bilateral adrenal glands are normal in appearance. No hydroureteronephrosis in the visualized abdomen. Stomach/Bowel: Normal appearance of the stomach. No pathologic dilatation of visualized portions of small bowel or colon. A few scattered colonic diverticulae are noted, without surrounding inflammatory changes to suggest an acute diverticulitis at this time. Vascular/Lymphatic: Extensive atherosclerosis in the visualized abdominal vasculature, without evidence of aneurysm or dissection. No lymphadenopathy noted in the visualized abdomen. Other: No significant volume of ascites and no pneumoperitoneum in the visualized peritoneal cavity. Musculoskeletal: There are no aggressive appearing lytic or blastic lesions noted in the visualized portions of the skeleton. IMPRESSION: 1. The area of concern in segment 4B of the liver is compatible with a more severe area of focal fatty infiltration. There is also a background of generalized hepatic steatosis as well. No suspicious hepatic lesions are noted. 2. Colonic diverticulosis without evidence of acute diverticulitis at this time. 3. Mild cardiomegaly. 4. Areas of bronchial wall thickening and mild cylindrical bronchiectasis in the visualize lung bases. Electronically Signed   By: Trudie Reed M.D.   On: 03/18/2015 09:01   Ct Renal Stone Study 02/24/2015  CLINICAL DATA:  Low back pain starting Thursday. Nausea and vomiting. EXAM: CT ABDOMEN AND PELVIS WITHOUT CONTRAST TECHNIQUE: Multidetector CT  imaging of the abdomen and pelvis was performed following the standard protocol without IV contrast. COMPARISON:  Report from 10/26/2002 and overlapping images from CT chest 07/23/2012 FINDINGS: Lower chest: AICD lead noted. Mild cardiomegaly. The airway thickening in both lower lobes with mild cylindrical bronchiectasis in the right lower lobe. Mild atelectasis or scarring anteriorly in the left lower lobe along the major fissure. 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 Stomach/Bowel: Scattered colonic diverticula.  Appendix normal. Vascular/Lymphatic: Aortoiliac atherosclerotic vascular disease. Reproductive: 7.7 by 5.9 cm left adnexal/ ovarian lesion, fluid density. Other: No supplemental non-categorized findings. Musculoskeletal: Right greater than left sclerosis along the sacroiliac joints. Lumbar spondylosis and degenerative disc disease cause mild bilateral foraminal impingement  at L5-S1 and borderline impingement at L3-4 and L4-5. Small umbilical hernia contains adipose tissue. IMPRESSION: 1. 7.7 cm left ovarian fluid density lesion, likely a cyst. Given the patient's age this requires further characterization. Dedicated pelvic sonography is recommended. 2. There is an indistinct hypodense lesion in segment 4B of the liver. On today's noncontrast exam this is nonspecific. This could represent focal fatty infiltration but malignancy is not excluded. Correlate with liver enzymes in consider hepatic protocol MRI with and without contrast. 3. Mild cardiomegaly. 4. Airway thickening both lower lobes suggesting bronchitis. Mild cylindrical bronchiectasis in the right lower lobe. 5. Scattered colonic diverticula without diverticulitis identified. 6.  Aortoiliac atherosclerotic vascular disease. 7. 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 8. Bilateral sacroiliitis, right greater than left. 9. Lumbar spondylosis and degenerative disc disease, causing mild bilateral foraminal stenosis at L5-S1. Electronically Signed   By: Gaylyn Rong M.D.   On: 02/24/2015 08:34    Recent Labs: 09/23/2014: B Natriuretic Peptide 4.8 02/24/2015: ALT 18; BUN 9; Creatinine, Ser 0.77; Hemoglobin 13.7; Platelets 224; Potassium 3.5; Sodium 137    Lipid Panel No results found for: CHOL, TRIG, HDL, CHOLHDL, VLDL, LDLCALC, LDLDIRECT   Wt Readings from Last 3 Encounters:  03/24/15 239 lb 3.2 oz (108.5 kg)  02/24/15 240 lb (108.863 kg)  09/23/14 245 lb (111.131 kg)     Other studies Reviewed: Additional studies/ records that were reviewed today include: Previous office records, ER notes and ECGs.  ASSESSMENT AND PLAN:  1.  Chronic systolic and diastolic CHF: Her weight is a couple of pounds below previous values. Her volume status appears good on exam. She is not having any symptoms of volume overload. Continue current therapy. She is encouraged to increase her activity level when playing with grandchildren and be more active.  2. History of ICD: She has no palpitations and is compliant with her device checks. She is followed by Dr. Johney Frame for this.  3. Hypertension: She is to check her blood pressure at home at, at various times the day. Because she is a diabetic, her goal systolic blood pressure is 135. She is consistently above this, we could increase her Aldactone or hydralazine for better blood pressure control.    Once she has been tracking her blood pressure for a while, she is to call in the numbers and we can make med changes as needed.   Current medicines are reviewed at length with the patient today.  The patient does not have concerns regarding medicines.  The following changes have been made:  no change  Labs/ tests ordered today include:  No orders of the defined types were placed in this encounter.       Disposition:   FU with Dr. Jens Som in February.  Tawny Asal  03/24/2015 10:14 AM    [facility] [address] [place], [place], [place]  [address] Phone: [phone]; Fax: [phone]

## 2015-03-24 NOTE — Patient Instructions (Signed)
Theodore Demark, PA-C, recommends that you schedule a follow-up appointment in February 2017 with Dr Jens Som.  Please keep a record of your blood pressures and call us back in a couple weeks to speak with triage nurse.  Bjorn Loser recommends that you increase your activity with your grandkids.  If you need a refill on your cardiac medications before your next appointment, please call your pharmacy.

## 2015-03-27 ENCOUNTER — Other Ambulatory Visit (HOSPITAL_COMMUNITY): Payer: Self-pay | Admitting: Adult Health Nurse Practitioner

## 2015-03-27 DIAGNOSIS — N83202 Unspecified ovarian cyst, left side: Secondary | ICD-10-CM

## 2015-04-01 ENCOUNTER — Ambulatory Visit (HOSPITAL_COMMUNITY): Payer: Medicare Other

## 2015-05-28 ENCOUNTER — Telehealth: Payer: Self-pay | Admitting: Cardiology

## 2015-05-28 MED ORDER — LOSARTAN POTASSIUM 100 MG PO TABS: 100.0000 mg | ORAL_TABLET | Freq: Every day | ORAL | Status: DC

## 2015-05-28 NOTE — Telephone Encounter (Signed)
°*  STAT* If patient is at the pharmacy, call can be transferred to refill team.   1. Which medications need to be refilled? (please list name of each medication and dose if known) Lorsartan 100mg   2. Which pharmacy/location (including street and city if local pharmacy) is medication to be sent to?[facility] [phone]  3. Do they need a 30 day or 90 day supply? 30day

## 2015-05-28 NOTE — Telephone Encounter (Signed)
Losartan refilled electronically with 1 refill.

## 2015-06-02 ENCOUNTER — Ambulatory Visit (INDEPENDENT_AMBULATORY_CARE_PROVIDER_SITE_OTHER): Payer: Medicare Other | Admitting: *Deleted

## 2015-06-02 DIAGNOSIS — I5022 Chronic systolic (congestive) heart failure: Secondary | ICD-10-CM | POA: Diagnosis not present

## 2015-06-02 DIAGNOSIS — I429 Cardiomyopathy, unspecified: Secondary | ICD-10-CM

## 2015-06-03 ENCOUNTER — Telehealth: Payer: Self-pay | Admitting: Cardiology

## 2015-06-03 ENCOUNTER — Other Ambulatory Visit: Payer: Self-pay | Admitting: *Deleted

## 2015-06-03 MED ORDER — CARVEDILOL 25 MG PO TABS: 25.0000 mg | ORAL_TABLET | Freq: Two times a day (BID) | ORAL | Status: DC

## 2015-06-03 NOTE — Telephone Encounter (Signed)
Rx request sent to pharmacy.  

## 2015-06-03 NOTE — Telephone Encounter (Signed)
°*  STAT* If patient is at the pharmacy, call can be transferred to refill team.   1. Which medications need to be refilled? (please list name of each medication and dose if known) Carvedilol  2. Which pharmacy/location (including street and city if local pharmacy) is medication to be sent to?[facility] in [place]   3. Do they need a 30 day or 90 day supply? 30

## 2015-06-03 NOTE — Progress Notes (Signed)
Remote ICD transmission.   

## 2015-06-06 ENCOUNTER — Encounter: Payer: Self-pay | Admitting: Cardiology

## 2015-06-06 LAB — CUP PACEART REMOTE DEVICE CHECK
Battery Remaining Longevity: 129 mo
Battery Voltage: 3.03 V
Date Time Interrogation Session: 20170116072823
HighPow Impedance: 73 Ohm
Implantable Lead Implant Date: 20150903
Implantable Lead Model: 6935
Lead Channel Pacing Threshold Pulse Width: 0.4 ms
Lead Channel Sensing Intrinsic Amplitude: 15.625 mV
Lead Channel Setting Pacing Amplitude: 2 V
Lead Channel Setting Pacing Pulse Width: 0.4 ms
Lead Channel Setting Sensing Sensitivity: 0.3 mV
MDC IDC LEAD LOCATION: 753860
MDC IDC MSMT LEADCHNL RV IMPEDANCE VALUE: 361 Ohm
MDC IDC MSMT LEADCHNL RV IMPEDANCE VALUE: 456 Ohm
MDC IDC MSMT LEADCHNL RV PACING THRESHOLD AMPLITUDE: 1 V
MDC IDC MSMT LEADCHNL RV SENSING INTR AMPL: 15.625 mV
MDC IDC STAT BRADY RV PERCENT PACED: 0.01 %

## 2015-06-25 NOTE — Progress Notes (Signed)
HPI: FU congestive heart failure/cardiomyopathy. Previously followed in [facility]. Last echocardiogram in July of 2014 showed an ejection fraction of 25-30% with grade 2 diastolic dysfunction. There was mild left atrial enlargement. There was mild mitral regurgitation. Nuclear study in April of 2014 showed normal perfusion. Ejection fraction was 24%. Carotid Dopplers in April 2014 showed less than 39% left carotid stenosis. Chest CT in March of 2014 showed no pulmonary embolus, small to moderate right effusion which was felt to be partially loculated. Cardiac catheterization in October of 2014 showed nonobstructive coronary disease and an ejection fraction of 30%. Right heart pressures were normal. Apparently patient has had poor compliance with treatment and poor tolerance of treatment based on outside records. Echocardiogram repeated in June of 2015. Ejection fraction 25-30%. Moderate to severe reduction in RV function and mild right atrial enlargement. ICD placed 9/15. Since she was last seen, the patient has dyspnea with more extreme activities but not with routine activities. It is relieved with rest. It is not associated with chest pain. There is no orthopnea, PND or pedal edema. There is no syncope or palpitations. There is no exertional chest pain.   Current Outpatient Prescriptions  Medication Sig Dispense Refill  . aspirin 81 MG tablet Take 81 mg by mouth daily.    . carvedilol (COREG) 25 MG tablet Take 1 tablet (25 mg total) by mouth 2 (two) times daily with a meal. 60 tablet 1  . furosemide (LASIX) 20 MG tablet Take 2 tablets (40 mg total) by mouth daily. 180 tablet 3  . glipiZIDE (GLUCOTROL XL) 10 MG 24 hr tablet Take 5 mg by mouth 2 (two) times daily.    . hydrALAZINE (APRESOLINE) 50 MG tablet Take 1 tablet (50 mg total) by mouth 3 (three) times daily. 90 tablet 3  . losartan (COZAAR) 100 MG tablet Take 1 tablet (100 mg total) by mouth daily. 30 tablet 1  . metFORMIN (GLUCOPHAGE)  500 MG tablet Take 1,000 mg by mouth 2 (two) times daily with a meal.     . omeprazole (PRILOSEC) 20 MG capsule Take 20 mg by mouth 2 (two) times daily.    . Polyvinyl Alcohol-Povidone (REFRESH OP) Place 1 drop into both eyes 2 (two) times daily as needed (for dry eyes).     . pravastatin (PRAVACHOL) 40 MG tablet Take 1 tablet (40 mg total) by mouth daily. 30 tablet 6  . spironolactone (ALDACTONE) 25 MG tablet Take 1 tablet (25 mg total) by mouth daily. 90 tablet 1   No current facility-administered medications for this visit.     Past Medical History  Diagnosis Date  . Hypertensive cardiomyopathy (HCC)     By report there is poor compliance w/treatment, poor tolerance of treatment & poor symptom control. Symptoms include dyspnea & DOE.  . Essential hypertension, benign   . Obesity   . Hyperlipidemia, mixed   . Unspecified menopausal and postmenopausal disorder   . Bruit     Bilat carotid duplex 09/05/12 CONCLUSION: Mild, less than or equal to 39%, left internal carotid artery stenosis.  . Hypersomnia, unspecified   . CHF (congestive heart failure) (HCC)     MPI 09/05/12 NORMAL, showed no scar & LVEF is 20-25%. ECHO 12/11/12 LV is markedly dilated, Overall LV systolic function severely impaired with EF = 25-30%, pseudonormal LV filling pattern (consistant with elevated LA pressure).  . PUD (peptic ulcer disease)   . 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 Diabetes mellitus, type 2 (HCC)     TYPE 2    Past Surgical History  Procedure Laterality Date  . None    . Cardiac defibrillator placement  01/17/2014    DR ALLRED  . Left and right heart catheterization with coronary angiogram N/A 02/27/2013    Procedure: LEFT AND RIGHT HEART CATHETERIZATION WITH CORONARY ANGIOGRAM;  Surgeon: Rollene Rotunda, MD;  Location: [facility] CATH LAB;  Service: Cardiovascular;  Laterality: N/A;  . Implantable cardioverter defibrillator implant N/A 01/17/2014     Procedure: IMPLANTABLE CARDIOVERTER DEFIBRILLATOR IMPLANT;  Surgeon: Gardiner Rhyme, MD;  Location: [facility] CATH LAB;  Service: Cardiovascular;  Laterality: N/A;    Social History   Social History  . Marital Status: Married    Spouse Name: N/A  . Number of Children: 5  . Years of Education: N/A   Occupational History  .      Retired   Social History Main Topics  . Smoking status: Never Smoker   . Smokeless tobacco: Never Used  . Alcohol Use: No  . Drug Use: No  . Sexual Activity: No   Other Topics Concern  . Not on file   Social History Narrative   Lives in [place] with husband and son.  Retired from Designer, fashion/clothing    Family History  Problem Relation Age of Onset  . Diabetes Mellitus II Mother   . Cancer Brother     Colon  . Hypertension Father     ROS: no fevers or chills, productive cough, hemoptysis, dysphasia, odynophagia, melena, hematochezia, dysuria, hematuria, rash, seizure activity, orthopnea, PND, pedal edema, claudication. Remaining systems are negative.  Physical Exam: Well-developed obese in no acute distress.  Skin is warm and dry.  HEENT is normal.  Neck is supple.  Chest is clear to auscultation with normal expansion.  Cardiovascular exam is regular rate and rhythm.  Abdominal exam nontender or distended. No masses palpated. Extremities show no edema. neuro grossly intact

## 2015-06-30 ENCOUNTER — Ambulatory Visit (INDEPENDENT_AMBULATORY_CARE_PROVIDER_SITE_OTHER): Payer: Medicare Other | Admitting: Cardiology

## 2015-06-30 ENCOUNTER — Encounter: Payer: Self-pay | Admitting: Cardiology

## 2015-06-30 VITALS — BP 156/90 | HR 76 | Ht 64.0 in | Wt 243.0 lb

## 2015-06-30 DIAGNOSIS — E785 Hyperlipidemia, unspecified: Secondary | ICD-10-CM | POA: Diagnosis not present

## 2015-06-30 DIAGNOSIS — I2510 Atherosclerotic heart disease of native coronary artery without angina pectoris: Secondary | ICD-10-CM

## 2015-06-30 DIAGNOSIS — I5022 Chronic systolic (congestive) heart failure: Secondary | ICD-10-CM

## 2015-06-30 DIAGNOSIS — I2583 Coronary atherosclerosis due to lipid rich plaque: Secondary | ICD-10-CM

## 2015-06-30 DIAGNOSIS — E782 Mixed hyperlipidemia: Secondary | ICD-10-CM | POA: Insufficient documentation

## 2015-06-30 MED ORDER — HYDRALAZINE HCL 100 MG PO TABS: 100.0000 mg | ORAL_TABLET | Freq: Three times a day (TID) | ORAL | Status: DC

## 2015-06-30 NOTE — Assessment & Plan Note (Signed)
Plan to continue present dose of diuretics. Check potassium and renal function.

## 2015-06-30 NOTE — Assessment & Plan Note (Signed)
Followed by electrophysiology. 

## 2015-06-30 NOTE — Assessment & Plan Note (Signed)
Continue statin. Check lipids and liver. 

## 2015-06-30 NOTE — Patient Instructions (Signed)
Medication Instructions:   INCREASE HYDRALAZINE TO 100 MG THREE TIMES DAILY= 2 OF THE 50 MG TABLETS THREE TIMES DAILY  Labwork:  Your physician recommends that you HAVE LAB WORK TODAY  Follow-Up:  Your physician wants you to follow-up in: 6 MONTHS WITH DR Jens Som You will receive a reminder letter in the mail two months in advance. If you don't receive a letter, please call our office to schedule the follow-up appointment.   If you need a refill on your cardiac medications before your next appointment, please call your pharmacy.

## 2015-06-30 NOTE — Assessment & Plan Note (Signed)
Continue aspirin and statin. 

## 2015-06-30 NOTE — Assessment & Plan Note (Signed)
Blood pressure elevated.Increase hydralazine to 100 mg by mouth 3 times a day. 

## 2015-06-30 NOTE — Assessment & Plan Note (Signed)
Discussed weight loss. 

## 2015-06-30 NOTE — Assessment & Plan Note (Signed)
Continue ARB and beta blocker. Increase hydralazine to 100 mg by mouth 3 times a day.

## 2015-08-13 ENCOUNTER — Encounter: Payer: Self-pay | Admitting: *Deleted

## 2015-09-01 ENCOUNTER — Ambulatory Visit (INDEPENDENT_AMBULATORY_CARE_PROVIDER_SITE_OTHER): Payer: Medicare Other | Admitting: *Deleted

## 2015-09-01 DIAGNOSIS — I5022 Chronic systolic (congestive) heart failure: Secondary | ICD-10-CM | POA: Diagnosis not present

## 2015-09-01 DIAGNOSIS — I429 Cardiomyopathy, unspecified: Secondary | ICD-10-CM

## 2015-09-01 NOTE — Progress Notes (Signed)
Remote ICD transmission.   

## 2015-10-09 ENCOUNTER — Encounter: Payer: Self-pay | Admitting: Cardiology

## 2015-10-09 LAB — CUP PACEART REMOTE DEVICE CHECK
Brady Statistic RV Percent Paced: 0.01 %
Date Time Interrogation Session: 20170417052402
HIGH POWER IMPEDANCE MEASURED VALUE: 71 Ohm
Lead Channel Impedance Value: 342 Ohm
Lead Channel Impedance Value: 418 Ohm
Lead Channel Pacing Threshold Amplitude: 1 V
Lead Channel Sensing Intrinsic Amplitude: 20.75 mV
Lead Channel Sensing Intrinsic Amplitude: 20.75 mV
MDC IDC LEAD IMPLANT DT: 20150903
MDC IDC LEAD LOCATION: 753860
MDC IDC LEAD MODEL: 6935
MDC IDC MSMT BATTERY REMAINING LONGEVITY: 127 mo
MDC IDC MSMT BATTERY VOLTAGE: 3.03 V
MDC IDC MSMT LEADCHNL RV PACING THRESHOLD PULSEWIDTH: 0.4 ms
MDC IDC SET LEADCHNL RV PACING AMPLITUDE: 2 V
MDC IDC SET LEADCHNL RV PACING PULSEWIDTH: 0.4 ms
MDC IDC SET LEADCHNL RV SENSING SENSITIVITY: 0.3 mV

## 2015-10-24 LAB — CUP PACEART REMOTE DEVICE CHECK
Battery Remaining Longevity: 131 mo
Battery Voltage: 3.03 V
Brady Statistic RV Percent Paced: 0 %
Date Time Interrogation Session: 20161017133513
HighPow Impedance: 72 Ohm
Implantable Lead Implant Date: 20150903
Implantable Lead Location: 753860
Implantable Lead Model: 6935
Lead Channel Pacing Threshold Pulse Width: 0.4 ms
Lead Channel Sensing Intrinsic Amplitude: 16.375 mV
Lead Channel Setting Pacing Amplitude: 2 V
Lead Channel Setting Pacing Pulse Width: 0.4 ms
Lead Channel Setting Sensing Sensitivity: 0.3 mV
MDC IDC MSMT LEADCHNL RV IMPEDANCE VALUE: 342 Ohm
MDC IDC MSMT LEADCHNL RV IMPEDANCE VALUE: 418 Ohm
MDC IDC MSMT LEADCHNL RV PACING THRESHOLD AMPLITUDE: 1 V

## 2016-01-20 NOTE — Progress Notes (Signed)
HPI: FU congestive heart failure/cardiomyopathy. Previously followed in [facility]. Carotid Dopplers in April 2014 showed less than 39% left carotid stenosis. Chest CT in March of 2014 showed no pulmonary embolus, small to moderate right effusion which was felt to be partially loculated. Cardiac catheterization in October of 2014 showed nonobstructive coronary disease and an ejection fraction of 30%. Right heart pressures were normal. Apparently patient has had poor compliance with treatment and poor tolerance of treatment based on outside records. Echocardiogram repeated in June of 2015. Ejection fraction 25-30%. Moderate to severe reduction in RV function and mild right atrial enlargement. ICD placed 9/15. Since she was last seen, She has some dyspnea on exertion but no orthopnea, PND, palpitations, syncope or chest pain. Occasional minimal pedal edema.  Current Outpatient Prescriptions  Medication Sig Dispense Refill  . aspirin 81 MG tablet Take 81 mg by mouth daily.    . carvedilol (COREG) 25 MG tablet Take 1 tablet (25 mg total) by mouth 2 (two) times daily with a meal. 60 tablet 1  . furosemide (LASIX) 20 MG tablet Take 2 tablets (40 mg total) by mouth daily. 180 tablet 3  . glipiZIDE (GLUCOTROL XL) 10 MG 24 hr tablet Take 5 mg by mouth 2 (two) times daily.    . hydrALAZINE (APRESOLINE) 100 MG tablet Take 1 tablet (100 mg total) by mouth 3 (three) times daily. 270 tablet 3  . losartan (COZAAR) 100 MG tablet Take 1 tablet (100 mg total) by mouth daily. 30 tablet 1  . metFORMIN (GLUCOPHAGE) 500 MG tablet Take 1,000 mg by mouth 2 (two) times daily with a meal.     . omeprazole (PRILOSEC) 20 MG capsule Take 20 mg by mouth 2 (two) times daily.    . Polyvinyl Alcohol-Povidone (REFRESH OP) Place 1 drop into both eyes 2 (two) times daily as needed (for dry eyes).     . pravastatin (PRAVACHOL) 40 MG tablet Take 1 tablet (40 mg total) by mouth daily. 30 tablet 6  . spironolactone (ALDACTONE) 25  MG tablet Take 1 tablet (25 mg total) by mouth daily. 90 tablet 1   No current facility-administered medications for this visit.      Past Medical History:  Diagnosis Date  . Automatic implantable cardioverter-defibrillator in situ 01/17/2014   BY DR ALLRED  . Bruit    Bilat carotid duplex 09/05/12 CONCLUSION: Mild, less than or equal to 39%, left internal carotid artery stenosis.  . CHF (congestive heart failure) (HCC)    MPI 09/05/12 NORMAL, showed no scar & LVEF is 20-25%. ECHO 12/11/12 LV is markedly dilated, Overall LV systolic function severely impaired with EF = 25-30%, pseudonormal LV filling pattern (consistant with elevated LA pressure).  . Diabetes mellitus, type 2 (HCC)    TYPE 2  . Essential hypertension, benign   . Hyperlipidemia, mixed   . Hypersomnia, unspecified   . Hypertensive cardiomyopathy (HCC)    By report there is poor compliance w/treatment, poor tolerance of treatment & poor symptom control. Symptoms include dyspnea & DOE.  Marland Kitchen. Obesity   . PUD (peptic ulcer disease)   . Unspecified menopausal and postmenopausal disorder     Past Surgical History:  Procedure Laterality Date  . CARDIAC DEFIBRILLATOR PLACEMENT  01/17/2014   DR Johney FrameALLRED  . IMPLANTABLE CARDIOVERTER DEFIBRILLATOR IMPLANT N/A 01/17/2014   Procedure: IMPLANTABLE CARDIOVERTER DEFIBRILLATOR IMPLANT;  Surgeon: Gardiner RhymeJames D Allred, MD;  Location: [facility] CATH LAB;  Service: Cardiovascular;  Laterality: N/A;  . LEFT AND RIGHT  HEART CATHETERIZATION WITH CORONARY ANGIOGRAM N/A 02/27/2013   Procedure: LEFT AND RIGHT HEART CATHETERIZATION WITH CORONARY ANGIOGRAM;  Surgeon: Rollene Rotunda, MD;  Location: [facility] CATH LAB;  Service: Cardiovascular;  Laterality: N/A;  . none      Social History   Social History  . Marital status: Married    Spouse name: N/A  . Number of children: 5  . Years of education: N/A   Occupational History  .      Retired   Social History Main Topics  . Smoking status: Never Smoker  .  Smokeless tobacco: Never Used  . Alcohol use No  . Drug use: No  . Sexual activity: No   Other Topics Concern  . Not on file   Social History Narrative   Lives in [place] with husband and son.  Retired from Designer, fashion/clothing    Family History  Problem Relation Age of Onset  . Diabetes Mellitus II Mother   . Cancer Brother     Colon  . Hypertension Father     ROS: no fevers or chills, productive cough, hemoptysis, dysphasia, odynophagia, melena, hematochezia, dysuria, hematuria, rash, seizure activity, orthopnea, PND, claudication. Remaining systems are negative.  Physical Exam: Well-developed well-nourished in no acute distress.  Skin is warm and dry.  HEENT is normal.  Neck is supple.  Chest is clear to auscultation with normal expansion.  Cardiovascular exam is regular rate and rhythm.  Abdominal exam nontender or distended. No masses palpated. Extremities show no edema. neuro grossly intact  ECG-Sinus rhythmAt a rate of 69. Left ventricular hypertrophy with repolarization abnormality.  A/P  1 Coronary artery disease-continue aspirin and statin.  2 hypertension-blood pressure controlled. Continue present medications.  3 hyperlipidemia-continue statin. Check lipids and liver.  4 morbid obesity-we discussed the importance of weight loss.  5 ICD-followed by electrophysiology.  6 chronic systolic congestive heart failure-continue present dose of diuretics. Check potassium and renal function.  7 dilated nonischemic cardiomyopathy-continue beta blocker. Discontinue losartan. Begin entresto 49/51 BID 2 days later. Check potassium and renal function in 1 week.  Olga Millers, MD

## 2016-01-22 ENCOUNTER — Ambulatory Visit (INDEPENDENT_AMBULATORY_CARE_PROVIDER_SITE_OTHER): Payer: Medicare Other | Admitting: Cardiology

## 2016-01-22 ENCOUNTER — Encounter: Payer: Self-pay | Admitting: Cardiology

## 2016-01-22 VITALS — BP 156/88 | HR 69 | Ht 64.0 in | Wt 236.0 lb

## 2016-01-22 DIAGNOSIS — I10 Essential (primary) hypertension: Secondary | ICD-10-CM | POA: Diagnosis not present

## 2016-01-22 DIAGNOSIS — Z79899 Other long term (current) drug therapy: Secondary | ICD-10-CM

## 2016-01-22 DIAGNOSIS — E785 Hyperlipidemia, unspecified: Secondary | ICD-10-CM | POA: Diagnosis not present

## 2016-01-22 DIAGNOSIS — I2510 Atherosclerotic heart disease of native coronary artery without angina pectoris: Secondary | ICD-10-CM | POA: Diagnosis not present

## 2016-01-22 DIAGNOSIS — Z95810 Presence of automatic (implantable) cardiac defibrillator: Secondary | ICD-10-CM

## 2016-01-22 DIAGNOSIS — I2584 Coronary atherosclerosis due to calcified coronary lesion: Secondary | ICD-10-CM

## 2016-01-22 MED ORDER — SACUBITRIL-VALSARTAN 49-51 MG PO TABS: 1.0000 | ORAL_TABLET | Freq: Two times a day (BID) | ORAL | 3 refills | Status: DC

## 2016-01-22 NOTE — Patient Instructions (Signed)
Medication Instructions:  Your physician has recommended you make the following change in your medication:  1- STOP Losartan tomorrow, last dose was today, 01/22/16. 2- START Entresto 1 tablet by mouth twice a day on Sunday, 01/25/16.    Labwork: Your physician recommends that you return for lab work in ONE WEEK, 01/29/16. You will need to be fasting, DO NOT EAT OR DRINK past midnight on the morning of your lab work. The lab can be found on the FIRST FLOOR of out building in [address]   Follow-Up: Your physician recommends that you schedule a follow-up appointment in: 3 MONTHS WITH DR CRENSHAW.   If you need a refill on your cardiac medications before your next appointment, please call your pharmacy.

## 2016-01-27 NOTE — Addendum Note (Signed)
Addended by: Raelyn Number on: 01/27/2016 09:46 AM   Modules accepted: Orders

## 2016-02-23 ENCOUNTER — Telehealth: Payer: Self-pay | Admitting: *Deleted

## 2016-02-23 NOTE — Telephone Encounter (Signed)
Spoke with pt states that she is OUT of Entresto and it is too expensive for 180/$1,524.00. She needs an rx today she does have refills at the pharmacy for Losartan but her BP is still too high when taking this. Please call back today

## 2016-02-23 NOTE — Telephone Encounter (Signed)
Left message for pt to call to discuss losartan and entresto.

## 2016-02-23 NOTE — Telephone Encounter (Signed)
Spoke with pt, savings card for one month free placed at the front desk for pt pick up. She will also call the number to the company to see about patient assistance.

## 2016-03-11 ENCOUNTER — Encounter: Payer: Self-pay | Admitting: *Deleted

## 2016-03-22 ENCOUNTER — Other Ambulatory Visit: Payer: Medicare Other | Admitting: *Deleted

## 2016-03-22 DIAGNOSIS — E7800 Pure hypercholesterolemia, unspecified: Secondary | ICD-10-CM

## 2016-03-22 DIAGNOSIS — I5020 Unspecified systolic (congestive) heart failure: Secondary | ICD-10-CM

## 2016-03-22 DIAGNOSIS — I10 Essential (primary) hypertension: Secondary | ICD-10-CM

## 2016-03-22 DIAGNOSIS — I420 Dilated cardiomyopathy: Secondary | ICD-10-CM

## 2016-03-22 LAB — BASIC METABOLIC PANEL
BUN: 21 mg/dL (ref 7–25)
CHLORIDE: 103 mmol/L (ref 98–110)
CO2: 23 mmol/L (ref 20–31)
Calcium: 10 mg/dL (ref 8.6–10.4)
Creat: 0.87 mg/dL (ref 0.50–0.99)
GLUCOSE: 186 mg/dL — AB (ref 65–99)
POTASSIUM: 4.2 mmol/L (ref 3.5–5.3)
SODIUM: 137 mmol/L (ref 135–146)

## 2016-03-22 LAB — HEPATIC FUNCTION PANEL
ALBUMIN: 3.9 g/dL (ref 3.6–5.1)
ALK PHOS: 64 U/L (ref 33–130)
ALT: 10 U/L (ref 6–29)
AST: 13 U/L (ref 10–35)
Bilirubin, Direct: 0.1 mg/dL (ref <=0.2)
Indirect Bilirubin: 0.3 mg/dL (ref 0.2–1.2)
TOTAL PROTEIN: 7.3 g/dL (ref 6.1–8.1)
Total Bilirubin: 0.4 mg/dL (ref 0.2–1.2)

## 2016-03-22 LAB — LIPID PANEL
CHOL/HDL RATIO: 5 ratio — AB (ref <5.0)
CHOLESTEROL: 170 mg/dL (ref <200)
HDL: 34 mg/dL — ABNORMAL LOW (ref >50)
LDL CALC: 99 mg/dL
TRIGLYCERIDES: 186 mg/dL — AB (ref <150)
VLDL: 37 mg/dL — ABNORMAL HIGH (ref <30)

## 2016-03-22 NOTE — Addendum Note (Signed)
Addended by: Divonte Senger K on: 06/02/2015 08:28 AM   Modules accepted: Orders  

## 2016-03-22 NOTE — Addendum Note (Signed)
Addended by: Tonita Phoenix on: 03/22/2016 08:29 AM   Modules accepted: Orders

## 2016-03-23 ENCOUNTER — Telehealth: Payer: Self-pay | Admitting: *Deleted

## 2016-03-23 ENCOUNTER — Encounter: Payer: Self-pay | Admitting: Cardiology

## 2016-03-23 DIAGNOSIS — E785 Hyperlipidemia, unspecified: Secondary | ICD-10-CM

## 2016-03-23 MED ORDER — ATORVASTATIN CALCIUM 80 MG PO TABS: 80.0000 mg | ORAL_TABLET | Freq: Every day | ORAL | 12 refills | Status: DC

## 2016-03-23 NOTE — Telephone Encounter (Signed)
-----   Message from Lewayne Bunting, MD sent at 03/22/2016  4:48 PM EST ----- DC pravachol and begin lipitor 80 mg daily; lipids and liver 4 weeks Olga Millers

## 2016-03-23 NOTE — Telephone Encounter (Signed)
This encounter was created in error - please disregard.

## 2016-03-23 NOTE — Telephone Encounter (Signed)
Spoke with pt, aware of lab results and medication changes. New script sent to the pharmacy Lab orders mailed to the pt

## 2016-03-23 NOTE — Telephone Encounter (Signed)
Follow up  Pt voiced wanting to speak with nurse.  Please f/u

## 2016-03-23 NOTE — Telephone Encounter (Signed)
Left message for pt to call.

## 2016-03-30 ENCOUNTER — Other Ambulatory Visit (HOSPITAL_COMMUNITY): Payer: Self-pay | Admitting: Physician Assistant

## 2016-03-30 DIAGNOSIS — N61 Mastitis without abscess: Secondary | ICD-10-CM

## 2016-04-13 ENCOUNTER — Encounter (HOSPITAL_COMMUNITY): Payer: Medicare Other

## 2016-04-18 IMAGING — CR DG CHEST 2V
2 series · 2 of 2 positions shown · non-contrast
Comparison: 02/23/2013

CLINICAL DATA: Weakness.  Sore arm.  Defibrillator placement

EXAM:
CHEST  2 VIEW

[w chest pa]
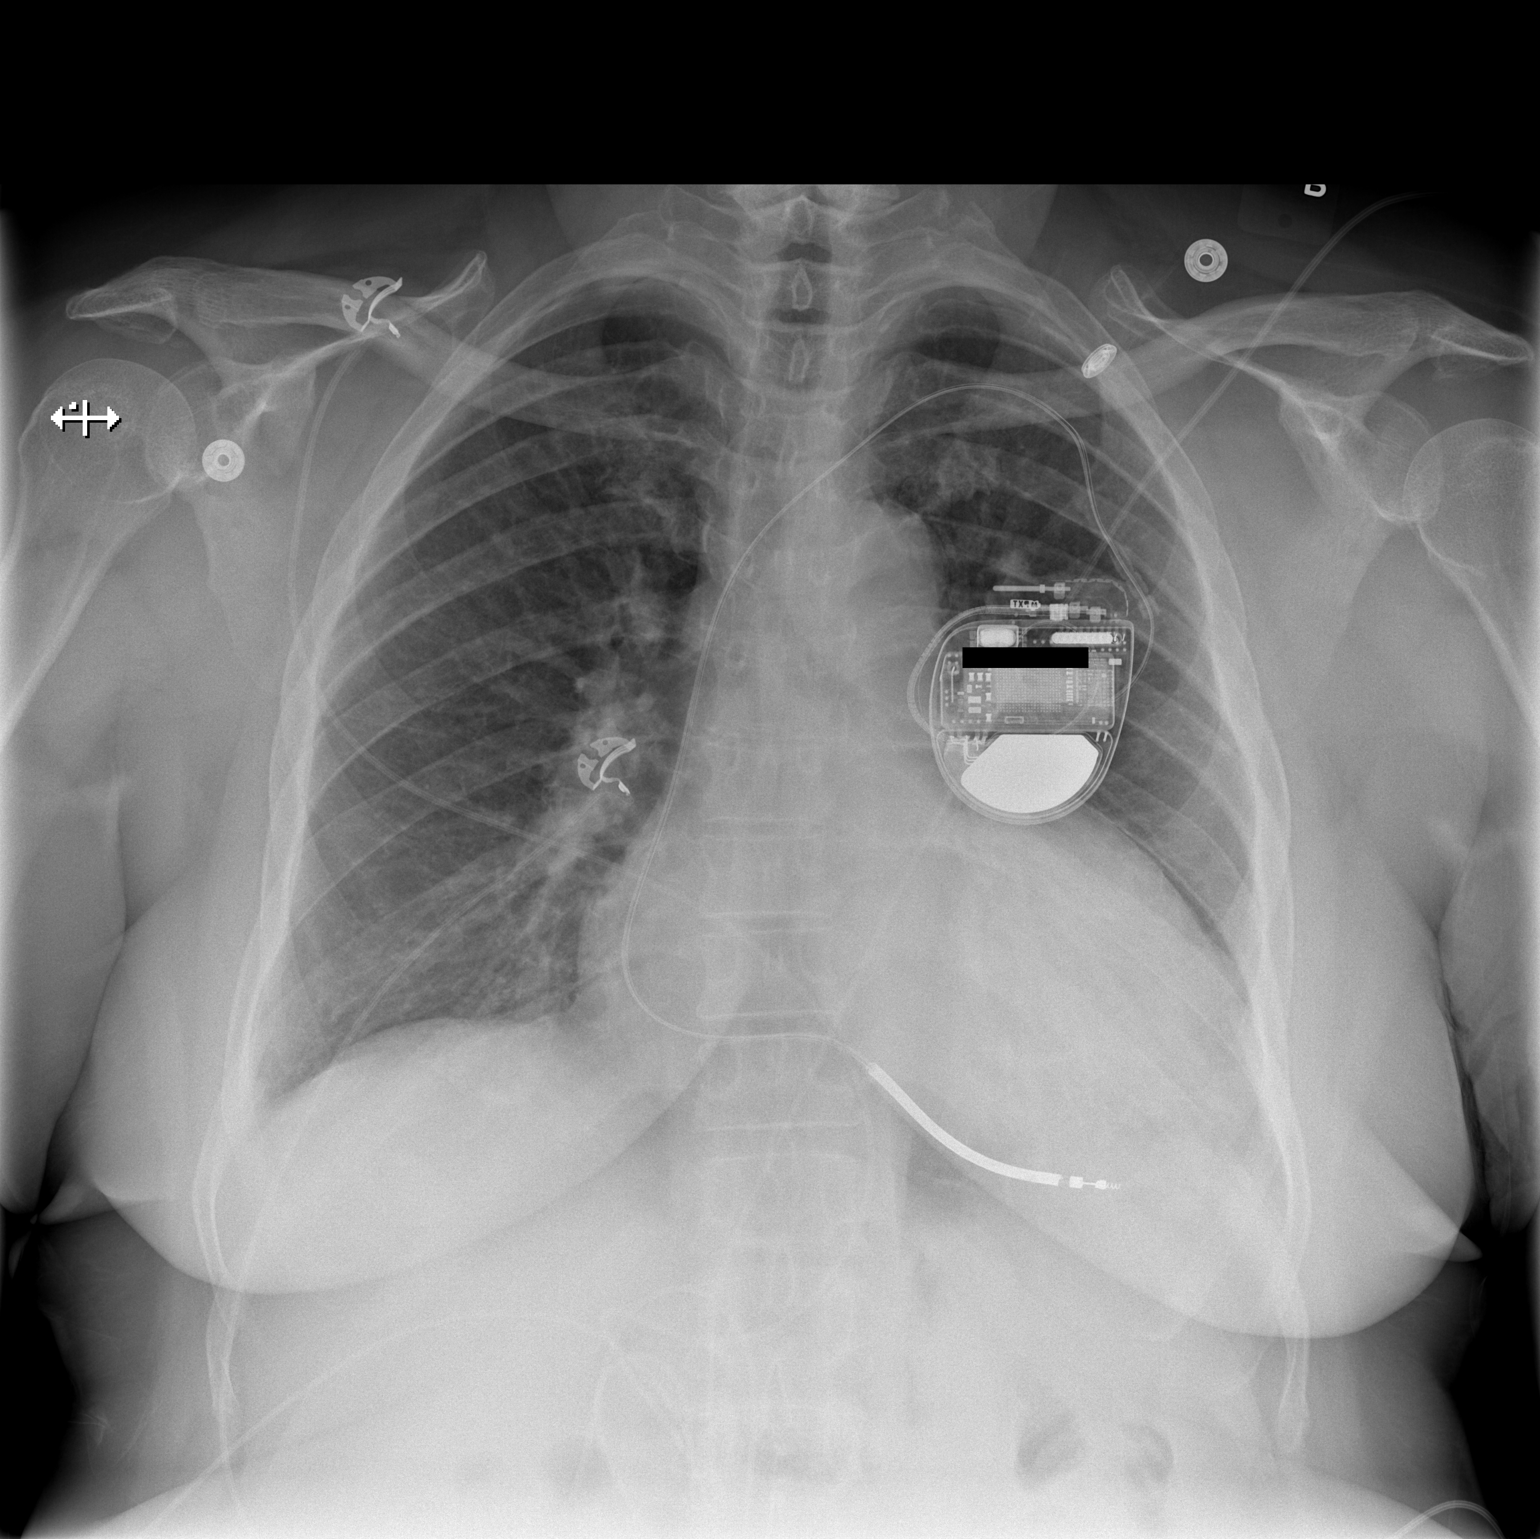

[w chest lat]
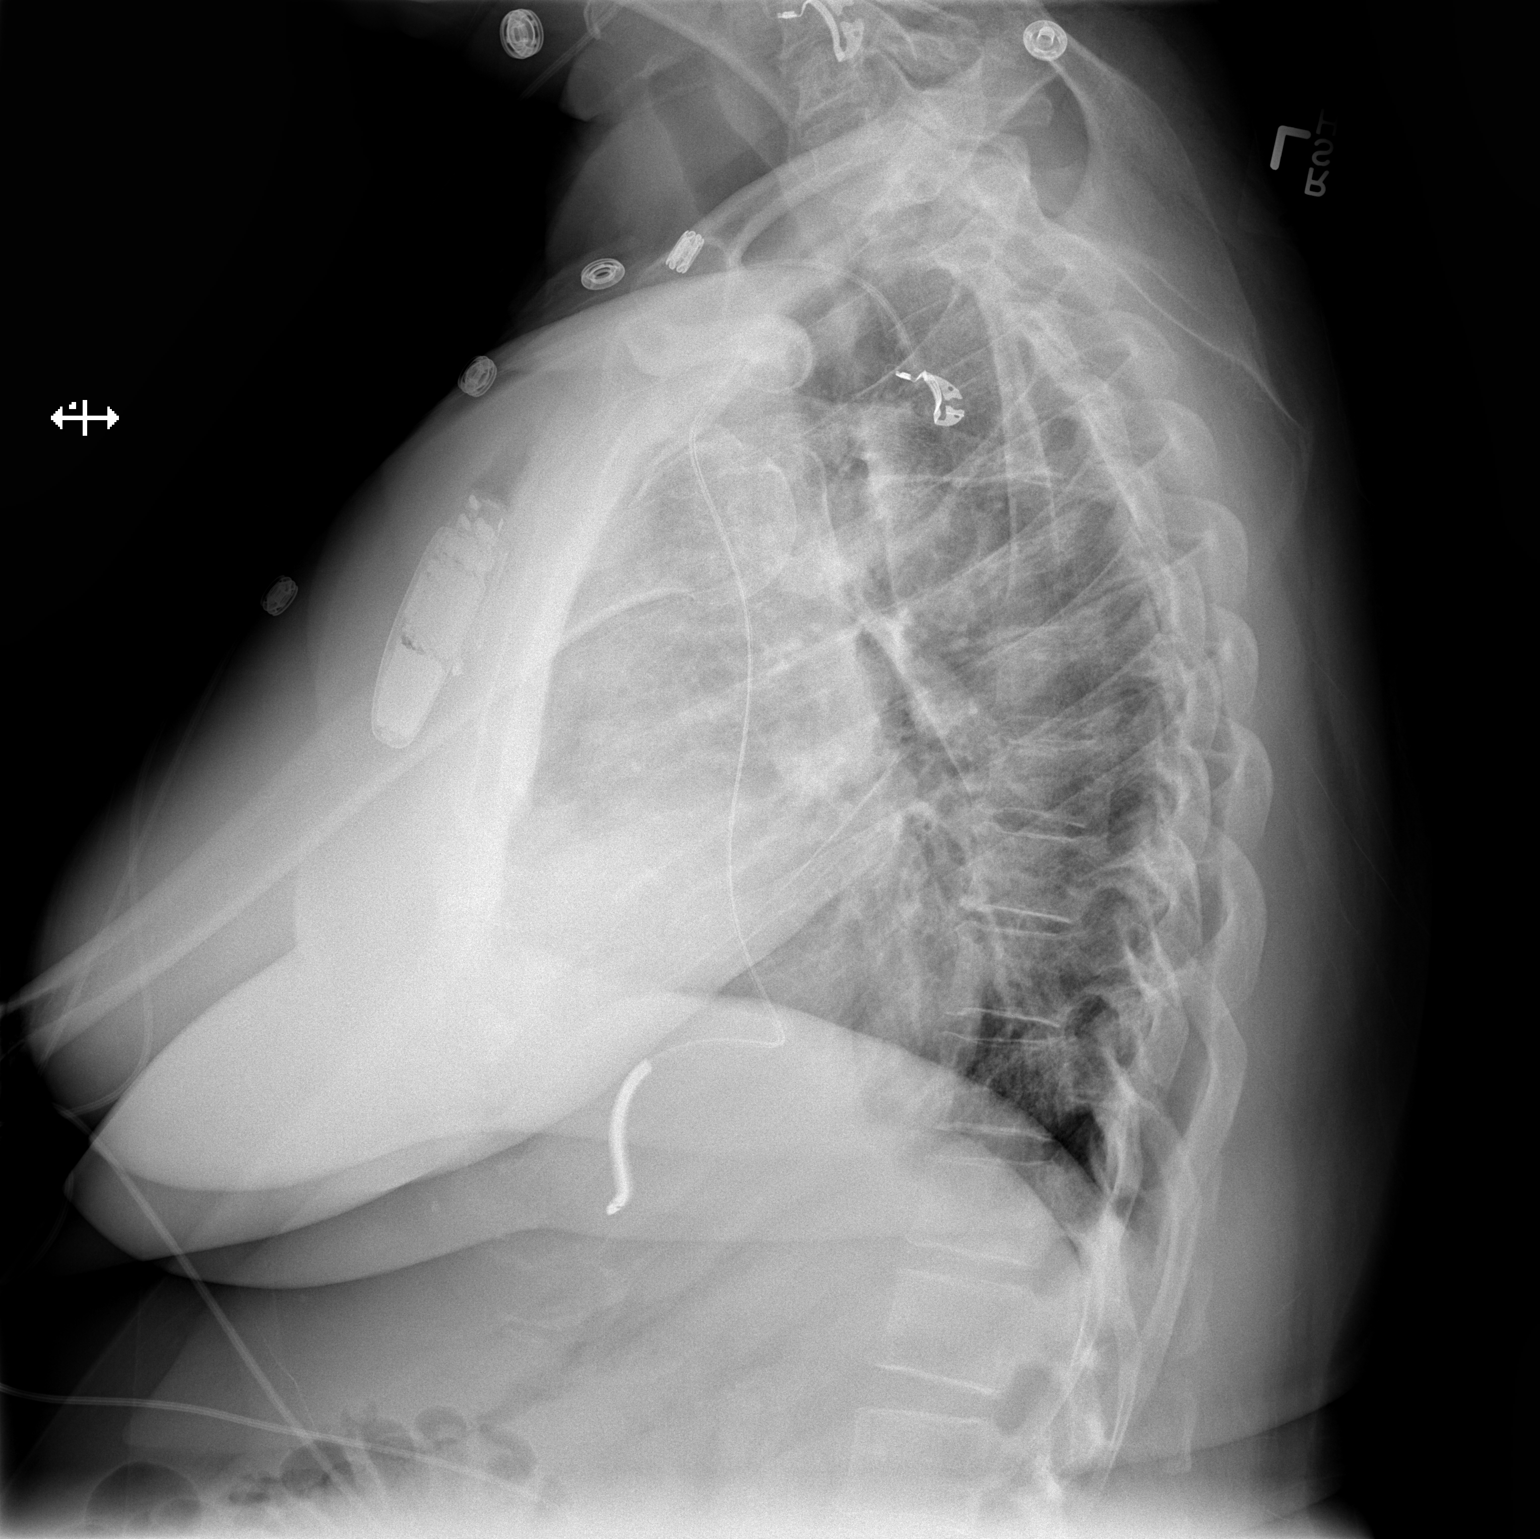

[2 of 2 positions shown; findings below may reference images not displayed]

FINDINGS: Left AICD is in place with single lead tip in the right atrium.
Cardiomegaly. Left lower lobe opacities, likely compressive
atelectasis, stable. No new pneumothorax. No confluent opacity on
the right. No effusions.
IMPRESSION: Left defibrillator placement without pneumothorax.

Left lower lobe atelectasis, cardiomegaly.

## 2016-04-20 NOTE — Progress Notes (Deleted)
HPI: FU congestive heart failure/cardiomyopathy. Previously followed in [facility]. Carotid Dopplers in April 2014 showed less than 39% left carotid stenosis. Chest CT in March of 2014 showed no pulmonary embolus, small to moderate right effusion which was felt to be partially loculated. Cardiac catheterization in October of 2014 showed nonobstructive coronary disease and an ejection fraction of 30%. Right heart pressures were normal. Apparently patient has had poor compliance with treatment and poor tolerance of treatment based on outside records. Echocardiogram repeated in June of 2015. Ejection fraction 25-30%. Moderate to severe reduction in RV function and mild right atrial enlargement. ICD placed 9/15. Since she was last seen,   Current Outpatient Prescriptions  Medication Sig Dispense Refill  . aspirin 81 MG tablet Take 81 mg by mouth daily.    Marland Kitchen atorvastatin (LIPITOR) 80 MG tablet Take 1 tablet (80 mg total) by mouth daily. 30 tablet 12  . carvedilol (COREG) 25 MG tablet Take 1 tablet (25 mg total) by mouth 2 (two) times daily with a meal. 60 tablet 1  . furosemide (LASIX) 20 MG tablet Take 2 tablets (40 mg total) by mouth daily. 180 tablet 3  . glipiZIDE (GLUCOTROL XL) 10 MG 24 hr tablet Take 5 mg by mouth 2 (two) times daily.    . hydrALAZINE (APRESOLINE) 100 MG tablet Take 1 tablet (100 mg total) by mouth 3 (three) times daily. 270 tablet 3  . metFORMIN (GLUCOPHAGE) 500 MG tablet Take 1,000 mg by mouth 2 (two) times daily with a meal.     . omeprazole (PRILOSEC) 20 MG capsule Take 20 mg by mouth 2 (two) times daily.    . Polyvinyl Alcohol-Povidone (REFRESH OP) Place 1 drop into both eyes 2 (two) times daily as needed (for dry eyes).     . sacubitril-valsartan (ENTRESTO) 49-51 MG Take 1 tablet by mouth 2 (two) times daily. 180 tablet 3  . spironolactone (ALDACTONE) 25 MG tablet Take 1 tablet (25 mg total) by mouth daily. 90 tablet 1   No current facility-administered medications  for this visit.      Past Medical History:  Diagnosis Date  . Automatic implantable cardioverter-defibrillator in situ 01/17/2014   BY DR ALLRED  . Bruit    Bilat carotid duplex 09/05/12 CONCLUSION: Mild, less than or equal to 39%, left internal carotid artery stenosis.  . CHF (congestive heart failure) (HCC)    MPI 09/05/12 NORMAL, showed no scar & LVEF is 20-25%. ECHO 12/11/12 LV is markedly dilated, Overall LV systolic function severely impaired with EF = 25-30%, pseudonormal LV filling pattern (consistant with elevated LA pressure).  . Diabetes mellitus, type 2 (HCC)    TYPE 2  . Essential hypertension, benign   . Hyperlipidemia, mixed   . Hypersomnia, unspecified   . Hypertensive cardiomyopathy (HCC)    By report there is poor compliance w/treatment, poor tolerance of treatment & poor symptom control. Symptoms include dyspnea & DOE.  Marland Kitchen Obesity   . PUD (peptic ulcer disease)   . Unspecified menopausal and postmenopausal disorder     Past Surgical History:  Procedure Laterality Date  . CARDIAC DEFIBRILLATOR PLACEMENT  01/17/2014   DR Johney Frame  . IMPLANTABLE CARDIOVERTER DEFIBRILLATOR IMPLANT N/A 01/17/2014   Procedure: IMPLANTABLE CARDIOVERTER DEFIBRILLATOR IMPLANT;  Surgeon: Gardiner Rhyme, MD;  Location: [facility] CATH LAB;  Service: Cardiovascular;  Laterality: N/A;  . LEFT AND RIGHT HEART CATHETERIZATION WITH CORONARY ANGIOGRAM N/A 02/27/2013   Procedure: LEFT AND RIGHT HEART CATHETERIZATION WITH CORONARY ANGIOGRAM;  Surgeon: Rollene RotundaJames Hochrein, MD;  [facility];  Service: Cardiovascular;  Laterality: N/A;  . none      Social History   Social History  . Marital status: Married    Spouse name: N/A  . Number of children: 5  . Years of education: N/A   Occupational History  .      Retired   Social History Main Topics  . Smoking status: Never Smoker  . Smokeless tobacco: Never Used  . Alcohol use No  . Drug use: No  . Sexual activity: No   Other Topics Concern  . Not  on file   Social History Narrative   [place] and [place].  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 Diabetes Mellitus II Mother   . Cancer Brother     Colon  . Hypertension Father     ROS: no fevers or chills, productive cough, hemoptysis, dysphasia, odynophagia, melena, hematochezia, dysuria, hematuria, rash, seizure activity, orthopnea, PND, pedal edema, claudication. Remaining systems are negative.  Physical Exam: Well-developed well-nourished in no acute distress.  Skin is warm and dry.  HEENT is normal.  Neck is supple.  Chest is clear to auscultation with normal expansion.  Cardiovascular exam is regular rate and rhythm.  Abdominal exam nontender or distended. No masses palpated. Extremities show no edema. neuro grossly intact  ECG

## 2016-04-22 ENCOUNTER — Telehealth (HOSPITAL_COMMUNITY): Payer: Self-pay | Admitting: Cardiology

## 2016-04-22 NOTE — Telephone Encounter (Signed)
New message ° ° ° ° °

## 2016-04-23 ENCOUNTER — Ambulatory Visit: Payer: Medicare Other | Admitting: Cardiology

## 2016-05-19 ENCOUNTER — Telehealth: Payer: Self-pay | Admitting: Cardiology

## 2016-05-19 NOTE — Telephone Encounter (Signed)
Advised pt labwork orders on file for [facility] (f/u 4 week recheck on lipids), she may go any time to have fasting labwork. Pt voiced understanding and thanks.

## 2016-05-19 NOTE — Telephone Encounter (Signed)
Anna Beltran is calling and wanted to know if she could get blood work done before her appt. Please call, thanks.

## 2016-05-21 NOTE — Progress Notes (Signed)
HPI: FU congestive heart failure/cardiomyopathy. Previously followed in [facility]. Carotid Dopplers in April 2014 showed less than 39% left carotid stenosis. Chest CT in March of 2014 showed no pulmonary embolus, small to moderate right effusion which was felt to be partially loculated. Cardiac catheterization in October of 2014 showed nonobstructive coronary disease and an ejection fraction of 30%. Right heart pressures were normal. Apparently patient has had poor compliance with treatment and poor tolerance of treatment based on outside records. Echocardiogram repeated in June of 2015. Ejection fraction 25-30%. Moderate to severe reduction in RV function and mild right atrial enlargement. ICD placed 9/15. Since she was last seen, the patient has dyspnea with more extreme activities but not with routine activities. It is relieved with rest. It is not associated with chest pain. There is no orthopnea, PND or pedal edema. There is no syncope or palpitations. There is no exertional chest pain.   Current Outpatient Prescriptions  Medication Sig Dispense Refill  . aspirin 81 MG tablet Take 81 mg by mouth daily.    Marland Kitchen atorvastatin (LIPITOR) 80 MG tablet Take 1 tablet (80 mg total) by mouth daily. 30 tablet 12  . carvedilol (COREG) 25 MG tablet Take 1 tablet (25 mg total) by mouth 2 (two) times daily with a meal. 60 tablet 1  . furosemide (LASIX) 20 MG tablet Take 2 tablets (40 mg total) by mouth daily. 180 tablet 3  . hydrALAZINE (APRESOLINE) 100 MG tablet Take 1 tablet (100 mg total) by mouth 3 (three) times daily. 270 tablet 3  . metFORMIN (GLUCOPHAGE) 500 MG tablet Take 1,000 mg by mouth 2 (two) times daily with a meal.     . omeprazole (PRILOSEC) 20 MG capsule Take 20 mg by mouth 2 (two) times daily.    . Polyvinyl Alcohol-Povidone (REFRESH OP) Place 1 drop into both eyes 2 (two) times daily as needed (for dry eyes).     . sacubitril-valsartan (ENTRESTO) 49-51 MG Take 1 tablet by mouth 2 (two)  times daily. 180 tablet 3  . spironolactone (ALDACTONE) 25 MG tablet Take 1 tablet (25 mg total) by mouth daily. 90 tablet 1  . glipiZIDE (GLUCOTROL XL) 10 MG 24 hr tablet Take 5 mg by mouth 2 (two) times daily.     No current facility-administered medications for this visit.      Past Medical History:  Diagnosis Date  . Automatic implantable cardioverter-defibrillator in situ 01/17/2014   BY DR ALLRED  . Bruit    Bilat carotid duplex 09/05/12 CONCLUSION: Mild, less than or equal to 39%, left internal carotid artery stenosis.  . CHF (congestive heart failure) (HCC)    MPI 09/05/12 NORMAL, showed no scar & LVEF is 20-25%. ECHO 12/11/12 LV is markedly dilated, Overall LV systolic function severely impaired with EF = 25-30%, pseudonormal LV filling pattern (consistant with elevated LA pressure).  . Diabetes mellitus, type 2 (HCC)    TYPE 2  . Essential hypertension, benign   . Hyperlipidemia, mixed   . Hypersomnia, unspecified   . Hypertensive cardiomyopathy (HCC)    By report there is poor compliance w/treatment, poor tolerance of treatment & poor symptom control. Symptoms include dyspnea & DOE.  Marland Kitchen Obesity   . PUD (peptic ulcer disease)   . Unspecified menopausal and postmenopausal disorder     Past Surgical History:  Procedure Laterality Date  . CARDIAC DEFIBRILLATOR PLACEMENT  01/17/2014   DR Johney Frame  . IMPLANTABLE CARDIOVERTER DEFIBRILLATOR IMPLANT N/A 01/17/2014   Procedure:  IMPLANTABLE CARDIOVERTER DEFIBRILLATOR IMPLANT;  Surgeon: Gardiner Rhyme, MD;  Location: [facility] CATH LAB;  Service: Cardiovascular;  Laterality: N/A;  . LEFT AND RIGHT HEART CATHETERIZATION WITH CORONARY ANGIOGRAM N/A 02/27/2013   Procedure: LEFT AND RIGHT HEART CATHETERIZATION WITH CORONARY ANGIOGRAM;  Surgeon: Rollene Rotunda, MD;  Location: [facility] CATH LAB;  Service: Cardiovascular;  Laterality: N/A;  . none      Social History   Social History  . Marital status: Married    Spouse name: N/A  . Number of  children: 5  . Years of education: N/A   Occupational History  .      Retired   Social History Main Topics  . Smoking status: Never Smoker  . Smokeless tobacco: Never Used  . Alcohol use No  . Drug use: No  . Sexual activity: No   Other Topics Concern  . Not on file   Social History Narrative   Lives in [place] with husband and son.  Retired from Designer, fashion/clothing    Family History  Problem Relation Age of Onset  . Diabetes Mellitus II Mother   . Cancer Brother     Colon  . Hypertension Father     ROS: no fevers or chills, productive cough, hemoptysis, dysphasia, odynophagia, melena, hematochezia, dysuria, hematuria, rash, seizure activity, orthopnea, PND, pedal edema, claudication. Remaining systems are negative.  Physical Exam: Well-developed obese in no acute distress.  Skin is warm and dry.  HEENT is normal.  Neck is supple.  Chest is clear to auscultation with normal expansion.  Cardiovascular exam is regular rate and rhythm.  Abdominal exam nontender or distended. No masses palpated. Extremities show no edema. neuro grossly intact   A/P  1 Coronary artery disease-continue aspirin and statin. No chest pain.  2 hypertension-blood pressure borderline; increase entresto.  3 hyperlipidemia-continue statin.   4 morbid obesity-we discussed the importance of weight loss.  5 ICD-followed by electrophysiology.  6 chronic systolic congestive heart failure-continue present dose of diuretics. Euvolemic on exam.  7 dilated nonischemic cardiomyopathy-continue beta blocker. Increase entresto to  97/103 BID. Check potassium and renal function in 1 week. Will need assistance with cost. Otherwise may need to change back to ARB as she is having difficulty affording.  Olga Millers, MD

## 2016-05-24 ENCOUNTER — Other Ambulatory Visit: Payer: Medicare HMO | Admitting: *Deleted

## 2016-05-24 DIAGNOSIS — I5020 Unspecified systolic (congestive) heart failure: Secondary | ICD-10-CM | POA: Diagnosis not present

## 2016-05-24 DIAGNOSIS — I10 Essential (primary) hypertension: Secondary | ICD-10-CM

## 2016-05-24 NOTE — Addendum Note (Signed)
Addended by: BOWDEN, ROBIN K on: 05/24/2016 10:03 AM   Modules accepted: Orders  

## 2016-05-24 NOTE — Addendum Note (Signed)
Addended by: Tonita Phoenix on: 05/24/2016 10:03 AM   Modules accepted: Orders

## 2016-05-25 ENCOUNTER — Encounter: Payer: Self-pay | Admitting: *Deleted

## 2016-05-25 LAB — LIPID PANEL
CHOL/HDL RATIO: 4 ratio (ref 0.0–4.4)
Cholesterol, Total: 168 mg/dL (ref 100–199)
HDL: 42 mg/dL (ref >39)
LDL Calculated: 103 mg/dL — ABNORMAL HIGH (ref 0–99)
Triglycerides: 115 mg/dL (ref 0–149)
VLDL Cholesterol Cal: 23 mg/dL (ref 5–40)

## 2016-05-25 LAB — HEPATIC FUNCTION PANEL
ALT: 13 IU/L (ref 0–32)
AST: 9 IU/L (ref 0–40)
Albumin: 4.2 g/dL (ref 3.6–4.8)
Alkaline Phosphatase: 73 IU/L (ref 39–117)
Bilirubin Total: 0.3 mg/dL (ref 0.0–1.2)
Bilirubin, Direct: 0.09 mg/dL (ref 0.00–0.40)
TOTAL PROTEIN: 7.4 g/dL (ref 6.0–8.5)

## 2016-05-27 ENCOUNTER — Ambulatory Visit (INDEPENDENT_AMBULATORY_CARE_PROVIDER_SITE_OTHER): Payer: Medicare HMO | Admitting: Cardiology

## 2016-05-27 ENCOUNTER — Encounter: Payer: Self-pay | Admitting: Cardiology

## 2016-05-27 VITALS — BP 140/82 | HR 77 | Ht 63.0 in | Wt 237.0 lb

## 2016-05-27 DIAGNOSIS — I10 Essential (primary) hypertension: Secondary | ICD-10-CM

## 2016-05-27 DIAGNOSIS — I2510 Atherosclerotic heart disease of native coronary artery without angina pectoris: Secondary | ICD-10-CM | POA: Diagnosis not present

## 2016-05-27 DIAGNOSIS — I428 Other cardiomyopathies: Secondary | ICD-10-CM

## 2016-05-27 DIAGNOSIS — Z95810 Presence of automatic (implantable) cardiac defibrillator: Secondary | ICD-10-CM

## 2016-05-27 DIAGNOSIS — I420 Dilated cardiomyopathy: Secondary | ICD-10-CM | POA: Diagnosis not present

## 2016-05-27 DIAGNOSIS — I2584 Coronary atherosclerosis due to calcified coronary lesion: Secondary | ICD-10-CM

## 2016-05-27 MED ORDER — SACUBITRIL-VALSARTAN 97-103 MG PO TABS: 1.0000 | ORAL_TABLET | Freq: Two times a day (BID) | ORAL | 6 refills | Status: DC

## 2016-05-27 NOTE — Patient Instructions (Signed)
Medication Instructions:   INCREASE ENTRESTO TO 97/103 MG ONE TABLET TWICE DAILY  Labwork:  Your physician recommends that you return for LAB WORK IN ONE WEEK  Follow-Up:  Your physician wants you to follow-up in: 3 MONTHS WITH DR Jens Som You will receive a reminder letter in the mail two months in advance. If you don't receive a letter, please call our office to schedule the follow-up appointment.

## 2016-06-10 ENCOUNTER — Telehealth: Payer: Self-pay | Admitting: Cardiology

## 2016-06-10 NOTE — Telephone Encounter (Signed)
New Message  Pt voiced returning someone's call from yesterday but there are no notes for this pts encounter.  Please f/u

## 2016-06-10 NOTE — Telephone Encounter (Signed)
Spoke to patient . Patient states she saw number on caller id. No message left. Informed patient  - no message left to indicated who called her, spoke to dr Ludwig Clarks nurses- she did not call patient.  informed patient if needed they will call back.  patient verbalized understanding.

## 2016-07-05 ENCOUNTER — Encounter: Payer: Self-pay | Admitting: *Deleted

## 2016-07-13 ENCOUNTER — Other Ambulatory Visit: Payer: Self-pay | Admitting: *Deleted

## 2016-07-13 ENCOUNTER — Other Ambulatory Visit: Payer: Medicare HMO | Admitting: *Deleted

## 2016-07-13 DIAGNOSIS — I5020 Unspecified systolic (congestive) heart failure: Secondary | ICD-10-CM

## 2016-07-13 LAB — BASIC METABOLIC PANEL
BUN / CREAT RATIO: 16 (ref 12–28)
BUN: 12 mg/dL (ref 8–27)
CO2: 24 mmol/L (ref 18–29)
CREATININE: 0.74 mg/dL (ref 0.57–1.00)
Calcium: 10 mg/dL (ref 8.7–10.3)
Chloride: 101 mmol/L (ref 96–106)
GFR, EST AFRICAN AMERICAN: 97 mL/min/{1.73_m2} (ref >59)
GFR, EST NON AFRICAN AMERICAN: 84 mL/min/{1.73_m2} (ref >59)
Glucose: 172 mg/dL — ABNORMAL HIGH (ref 65–99)
Potassium: 4.3 mmol/L (ref 3.5–5.2)
SODIUM: 141 mmol/L (ref 134–144)

## 2016-07-23 ENCOUNTER — Encounter: Payer: Self-pay | Admitting: Cardiology

## 2016-08-22 NOTE — Progress Notes (Signed)
Cardiology Office Note Date:  08/24/2016  Patient ID:  Anna Beltran, Anna Beltran 1951-04-27, MRN [id number] PCP:  Josue Hector, MD  Cardiologist:  Dr. Jens Som Electrophysiologist: Dr. Johney Frame    Chief Complaint: annual EP visit  History of Present Illness: Anna Beltran is a 66 y.o. female with history of hypertensive CM w.ICD, Chronic CHF, NOD by cath in 2014, DM, HTN, obesity, comes in today to be seen for Dr. Johney Frame.   She was last seen by him in April 2017, more recently saw Dr. Jens Som in January doing well.  She remains feeling well, tells me she sleeps in a recliner but has for many years, denies any DOE, [place] grandchildren and is active without changes ion her exertional capacity.  Denies any CP, palpitations, no dizziness, near suncope or syncope.  She doesn't weigh herself, but has not noted any swelling.    DEVICE information: MDT single chamber ICD< implanted 01/17/14   Past Medical History:  Diagnosis Date  . Automatic implantable cardioverter-defibrillator in situ 01/17/2014   BY DR ALLRED  . Bruit    Bilat carotid duplex 09/05/12 CONCLUSION: Mild, less than or equal to 39%, left internal carotid artery stenosis.  . CHF (congestive heart failure) (HCC)    MPI 09/05/12 NORMAL, showed no scar & LVEF is 20-25%. ECHO 12/11/12 LV is markedly dilated, Overall LV systolic function severely impaired with EF = 25-30%, pseudonormal LV filling pattern (consistant with elevated LA pressure).  . Diabetes mellitus, type 2 (HCC)    TYPE 2  . Essential hypertension, benign   . Hyperlipidemia, mixed   . Hypersomnia, unspecified   . Hypertensive cardiomyopathy (HCC)    By report there is poor compliance w/treatment, poor tolerance of treatment & poor symptom control. Symptoms include dyspnea & DOE.  Marland Kitchen Obesity   . PUD (peptic ulcer disease)   . Unspecified menopausal and postmenopausal disorder     Past Surgical History:  Procedure Laterality  Date  . CARDIAC DEFIBRILLATOR PLACEMENT  01/17/2014   DR Johney Frame  . IMPLANTABLE CARDIOVERTER DEFIBRILLATOR IMPLANT N/A 01/17/2014   Procedure: IMPLANTABLE CARDIOVERTER DEFIBRILLATOR IMPLANT;  Surgeon: Gardiner Rhyme, MD;  Location: [facility] CATH LAB;  Service: Cardiovascular;  Laterality: N/A;  . LEFT AND RIGHT HEART CATHETERIZATION WITH CORONARY ANGIOGRAM N/A 02/27/2013   Procedure: LEFT AND RIGHT HEART CATHETERIZATION WITH CORONARY ANGIOGRAM;  Surgeon: Rollene Rotunda, MD;  Location: [facility] CATH LAB;  Service: Cardiovascular;  Laterality: N/A;  . none      Current Outpatient Prescriptions  Medication Sig Dispense Refill  . metFORMIN (GLUCOPHAGE) 1000 MG tablet Take 1,000 mg by mouth 2 (two) times daily.    Marland Kitchen aspirin 81 MG tablet Take 81 mg by mouth daily.    Marland Kitchen atorvastatin (LIPITOR) 80 MG tablet Take 1 tablet (80 mg total) by mouth daily. 30 tablet 12  . carvedilol (COREG) 25 MG tablet Take 1 tablet (25 mg total) by mouth 2 (two) times daily with a meal. 60 tablet 1  . furosemide (LASIX) 20 MG tablet Take 2 tablets (40 mg total) by mouth daily. 180 tablet 3  . glipiZIDE (GLUCOTROL XL) 10 MG 24 hr tablet Take 5 mg by mouth 2 (two) times daily.    . hydrALAZINE (APRESOLINE) 100 MG tablet Take 1 tablet (100 mg total) by mouth 3 (three) times daily. 270 tablet 3  . omeprazole (PRILOSEC) 20 MG capsule Take 20 mg by mouth 2 (two) times daily.    Marland Kitchen  Polyvinyl Alcohol-Povidone (REFRESH OP) Place 1 drop into both eyes 2 (two) times daily as needed (for dry eyes).     . sacubitril-valsartan (ENTRESTO) 97-103 MG Take 1 tablet by mouth 2 (two) times daily. 60 tablet 6  . spironolactone (ALDACTONE) 25 MG tablet Take 1 tablet (25 mg total) by mouth daily. 90 tablet 1   No current facility-administered medications for this visit.     Allergies:   Isosorbide nitrate   Social History:  The patient  reports that she has never smoked. She has never used smokeless tobacco. She reports that she does not drink alcohol or  use drugs.   Family History:  The patient's family history includes Cancer in her brother; Diabetes Mellitus II in her mother; Hypertension in her father.  ROS:  Please see the history of present illness.  All other systems are reviewed and otherwise negative.   PHYSICAL EXAM:  VS:  BP 128/68   Pulse 72   Ht 5\' 4"  (1.626 m)   Wt 236 lb (107 kg)   BMI 40.51 kg/m  BMI: Body mass index is 40.51 kg/m. Well nourished, well developed, in no acute distress  HEENT: normocephalic, atraumatic  Neck: no JVD, carotid bruits or masses Cardiac:  RRR; no significant murmurs, no rubs, or gallops Lungs: CTA b/l, no wheezing, rhonchi or rales  Abd: soft, nontender MS: no deformity or atrophy Ext:  no edema  Skin: warm and dry, no rash Neuro:  No gross deficits appreciated Psych: euthymic mood, full affect   ICD site is stable, no tethering or discomfort   EKG:  Done 01/22/16 showed SR, 69bpm ICD interrogation done today by industry and reviewed by myself: stable battery and lead measurements, no VT episodes, 100%VS  12/11/12: TTE LVEF 25-30% Mild MR  09/05/12: Lexiscan stress myoview Perfusion imaging is mnormal, LVEF 24%  Recent Labs: 05/24/2016: ALT 13 07/13/2016: BUN 12; Creatinine, Ser 0.74; Potassium 4.3; Sodium 141  03/22/2016: VLDL 37 05/24/2016: Chol/HDL Ratio 4.0; Cholesterol, Total 168; HDL 42; LDL Calculated 103; Triglycerides 115   CrCl cannot be calculated (Patient's most recent lab result is older than the maximum 21 days allowed.).   Wt Readings from Last 3 Encounters:  08/24/16 236 lb (107 kg)  05/27/16 237 lb (107.5 kg)  01/22/16 236 lb (107 kg)     Other studies reviewed: Additional studies/records reviewed today include: summarized above  ASSESSMENT AND PLAN:  1. Hypertensive heart disease, CM 2. Chronic CHF      exam appears euvolemic, weight is stable     On BB/ARB (entresto), aldactone, lasix     Feb BMET stable     c/w Dr. Genice Rouge  Discussed daily weights  and salt restriction  3. ICD     stable device function, no changes made  4. HTN     Stable, no changes made        Disposition: F/u with Dr. Jens Som as scheduled, Q 3 month remotes device checks, and 1 year with Dr. Johney Frame.  Current medicines are reviewed at length with the patient today.  The patient did not have any concerns regarding medicines.  Judith Blonder, PA-C 08/24/2016 8:51 AM     [facility] [address] [place] [address] [phone] (office)  [phone] (fax)

## 2016-08-24 ENCOUNTER — Ambulatory Visit (INDEPENDENT_AMBULATORY_CARE_PROVIDER_SITE_OTHER): Payer: Medicare HMO | Admitting: Physician Assistant

## 2016-08-24 ENCOUNTER — Encounter (INDEPENDENT_AMBULATORY_CARE_PROVIDER_SITE_OTHER): Payer: Self-pay

## 2016-08-24 VITALS — BP 128/68 | HR 72 | Ht 64.0 in | Wt 236.0 lb

## 2016-08-24 DIAGNOSIS — I110 Hypertensive heart disease with heart failure: Secondary | ICD-10-CM

## 2016-08-24 DIAGNOSIS — I5022 Chronic systolic (congestive) heart failure: Secondary | ICD-10-CM

## 2016-08-24 DIAGNOSIS — Z95810 Presence of automatic (implantable) cardiac defibrillator: Secondary | ICD-10-CM

## 2016-08-24 NOTE — Patient Instructions (Signed)
Medication Instructions:    Your physician recommends that you continue on your current medications as directed. Please refer to the Current Medication list given to you today.   If you need a refill on your cardiac medications before your next appointment, please call your pharmacy.  Labwork: NONE ORDERED  TODAY    Testing/Procedures: NONE ORDERED  TODAY   Follow-Up: Your physician wants you to follow-up in: ONE YEAR WITH ALLRED  You will receive a reminder letter in the mail two months in advance. If you don't receive a letter, please call our office to schedule the follow-up appointment.   Remote monitoring is used to monitor your Pacemaker of ICD from home. This monitoring reduces the number of office visits required to check your device to one time per year. It allows [place] to keep an eye on the functioning of your device to ensure it is working properly. You are scheduled for a device check from home on .Marland Kitchen.7*10*18You may send your transmission at any time that day. If you have a wireless device, the transmission will be sent automatically. After your physician reviews your transmission, you will receive a postcard with your next transmission date.        Any Other Special Instructions Will Be Listed Below (If Applicable).

## 2016-08-25 DIAGNOSIS — K08109 Complete loss of teeth, unspecified cause, unspecified class: Secondary | ICD-10-CM | POA: Diagnosis not present

## 2016-08-25 DIAGNOSIS — E119 Type 2 diabetes mellitus without complications: Secondary | ICD-10-CM | POA: Diagnosis not present

## 2016-08-25 DIAGNOSIS — E785 Hyperlipidemia, unspecified: Secondary | ICD-10-CM | POA: Diagnosis not present

## 2016-08-25 DIAGNOSIS — Z0000 Encounter for general adult medical examination without abnormal findings: Secondary | ICD-10-CM | POA: Diagnosis not present

## 2016-08-25 DIAGNOSIS — R0989 Other specified symptoms and signs involving the circulatory and respiratory systems: Secondary | ICD-10-CM | POA: Diagnosis not present

## 2016-08-25 DIAGNOSIS — I509 Heart failure, unspecified: Secondary | ICD-10-CM | POA: Diagnosis not present

## 2016-08-25 DIAGNOSIS — Z972 Presence of dental prosthetic device (complete) (partial): Secondary | ICD-10-CM | POA: Diagnosis not present

## 2016-08-25 DIAGNOSIS — Z6841 Body Mass Index (BMI) 40.0 and over, adult: Secondary | ICD-10-CM | POA: Diagnosis not present

## 2016-08-25 DIAGNOSIS — K219 Gastro-esophageal reflux disease without esophagitis: Secondary | ICD-10-CM | POA: Diagnosis not present

## 2016-08-25 DIAGNOSIS — I110 Hypertensive heart disease with heart failure: Secondary | ICD-10-CM | POA: Diagnosis not present

## 2016-08-25 DIAGNOSIS — Z7982 Long term (current) use of aspirin: Secondary | ICD-10-CM | POA: Diagnosis not present

## 2016-08-30 ENCOUNTER — Other Ambulatory Visit (HOSPITAL_COMMUNITY): Payer: Self-pay | Admitting: Adult Health Nurse Practitioner

## 2016-08-30 DIAGNOSIS — Z1231 Encounter for screening mammogram for malignant neoplasm of breast: Secondary | ICD-10-CM

## 2016-08-30 DIAGNOSIS — K5900 Constipation, unspecified: Secondary | ICD-10-CM | POA: Diagnosis not present

## 2016-08-30 DIAGNOSIS — I5022 Chronic systolic (congestive) heart failure: Secondary | ICD-10-CM | POA: Diagnosis not present

## 2016-08-30 DIAGNOSIS — Z23 Encounter for immunization: Secondary | ICD-10-CM | POA: Diagnosis not present

## 2016-08-30 DIAGNOSIS — K219 Gastro-esophageal reflux disease without esophagitis: Secondary | ICD-10-CM | POA: Diagnosis not present

## 2016-08-30 DIAGNOSIS — E785 Hyperlipidemia, unspecified: Secondary | ICD-10-CM | POA: Diagnosis not present

## 2016-08-30 DIAGNOSIS — I2510 Atherosclerotic heart disease of native coronary artery without angina pectoris: Secondary | ICD-10-CM | POA: Diagnosis not present

## 2016-08-30 DIAGNOSIS — E1169 Type 2 diabetes mellitus with other specified complication: Secondary | ICD-10-CM | POA: Diagnosis not present

## 2016-08-30 DIAGNOSIS — Z95810 Presence of automatic (implantable) cardiac defibrillator: Secondary | ICD-10-CM | POA: Diagnosis not present

## 2016-08-30 DIAGNOSIS — N6452 Nipple discharge: Secondary | ICD-10-CM | POA: Diagnosis not present

## 2016-08-31 DIAGNOSIS — R69 Illness, unspecified: Secondary | ICD-10-CM | POA: Diagnosis not present

## 2016-09-01 DIAGNOSIS — R69 Illness, unspecified: Secondary | ICD-10-CM | POA: Diagnosis not present

## 2016-09-08 DIAGNOSIS — R69 Illness, unspecified: Secondary | ICD-10-CM | POA: Diagnosis not present

## 2016-09-09 ENCOUNTER — Ambulatory Visit (HOSPITAL_COMMUNITY)
Admission: RE | Admit: 2016-09-09 | Discharge: 2016-09-09 | Disposition: A | Discharge status: Home / Self Care | Payer: Medicare HMO | Source: Ambulatory Visit | Attending: Adult Health Nurse Practitioner | Admitting: Adult Health Nurse Practitioner

## 2016-09-09 DIAGNOSIS — Z1231 Encounter for screening mammogram for malignant neoplasm of breast: Secondary | ICD-10-CM | POA: Diagnosis not present

## 2016-09-10 ENCOUNTER — Other Ambulatory Visit: Payer: Self-pay | Admitting: Cardiology

## 2016-09-10 DIAGNOSIS — I2510 Atherosclerotic heart disease of native coronary artery without angina pectoris: Secondary | ICD-10-CM

## 2016-09-10 DIAGNOSIS — I2583 Coronary atherosclerosis due to lipid rich plaque: Principal | ICD-10-CM

## 2016-09-13 NOTE — Telephone Encounter (Signed)
REFILL 

## 2016-09-14 ENCOUNTER — Encounter: Payer: Self-pay | Admitting: Cardiology

## 2016-09-27 NOTE — Progress Notes (Signed)
HPI: FU congestive heart failure/cardiomyopathy. Previously followed in [facility]. Carotid Dopplers in April 2014 showed less than 39% left carotid stenosis. Chest CT in March of 2014 showed no pulmonary embolus, small to moderate right effusion which was felt to be partially loculated. Cardiac catheterization in October of 2014 showed nonobstructive coronary disease and an ejection fraction of 30%. Right heart pressures were normal. Apparently patient has had poor compliance with treatment and poor tolerance of treatment based on outside records. Echocardiogram repeated in June of 2015. Ejection fraction 25-30%. Moderate to severe reduction in RV function and mild right atrial enlargement. ICD placed 9/15. Since she was last seen, patient has chronic orthopnea. She denies increased dyspnea on exertion, pedal edema, exertional chest pain or syncope.  Current Outpatient Prescriptions  Medication Sig Dispense Refill  . aspirin 81 MG tablet Take 81 mg by mouth daily.    Marland Kitchen atorvastatin (LIPITOR) 80 MG tablet Take 1 tablet (80 mg total) by mouth daily. 30 tablet 12  . carvedilol (COREG) 25 MG tablet Take 1 tablet (25 mg total) by mouth 2 (two) times daily with a meal. 60 tablet 1  . FARXIGA 10 MG TABS tablet Take 1 tablet by mouth daily.    . furosemide (LASIX) 20 MG tablet Take 2 tablets (40 mg total) by mouth daily. 180 tablet 3  . hydrALAZINE (APRESOLINE) 100 MG tablet TAKE ONE TABLET BY MOUTH THREE TIMES DAILY 270 tablet 2  . LINZESS 145 MCG CAPS capsule Take 1 tablet by mouth daily.    . metFORMIN (GLUCOPHAGE) 1000 MG tablet Take 1,000 mg by mouth 2 (two) times daily.    Marland Kitchen omeprazole (PRILOSEC) 20 MG capsule Take 20 mg by mouth 2 (two) times daily.    . Polyvinyl Alcohol-Povidone (REFRESH OP) Place 1 drop into both eyes 2 (two) times daily as needed (for dry eyes).     . sacubitril-valsartan (ENTRESTO) 97-103 MG Take 1 tablet by mouth 2 (two) times daily. 60 tablet 6  . spironolactone  (ALDACTONE) 25 MG tablet Take 1 tablet (25 mg total) by mouth daily. 90 tablet 1   No current facility-administered medications for this visit.      Past Medical History:  Diagnosis Date  . Automatic implantable cardioverter-defibrillator in situ 01/17/2014   BY DR ALLRED  . Bruit    Bilat carotid duplex 09/05/12 CONCLUSION: Mild, less than or equal to 39%, left internal carotid artery stenosis.  . CHF (congestive heart failure) (HCC)    MPI 09/05/12 NORMAL, showed no scar & LVEF is 20-25%. ECHO 12/11/12 LV is markedly dilated, Overall LV systolic function severely impaired with EF = 25-30%, pseudonormal LV filling pattern (consistant with elevated LA pressure).  . Diabetes mellitus, type 2 (HCC)    TYPE 2  . Essential hypertension, benign   . Hyperlipidemia, mixed   . Hypersomnia, unspecified   . Hypertensive cardiomyopathy (HCC)    By report there is poor compliance w/treatment, poor tolerance of treatment & poor symptom control. Symptoms include dyspnea & DOE.  Marland Kitchen Obesity   . PUD (peptic ulcer disease)   . Unspecified menopausal and postmenopausal disorder     Past Surgical History:  Procedure Laterality Date  . CARDIAC DEFIBRILLATOR PLACEMENT  01/17/2014   DR Johney Frame  . IMPLANTABLE CARDIOVERTER DEFIBRILLATOR IMPLANT N/A 01/17/2014   Procedure: IMPLANTABLE CARDIOVERTER DEFIBRILLATOR IMPLANT;  Surgeon: Gardiner Rhyme, MD;  Location: [facility] CATH LAB;  Service: Cardiovascular;  Laterality: N/A;  . LEFT AND RIGHT HEART CATHETERIZATION  WITH CORONARY ANGIOGRAM N/A 02/27/2013   Procedure: LEFT AND RIGHT HEART CATHETERIZATION WITH CORONARY ANGIOGRAM;  Surgeon: Rollene Rotunda, MD;  Location: [facility] CATH LAB;  Service: Cardiovascular;  Laterality: N/A;  . none      Social History   Social History  . Marital status: Married    Spouse name: N/A  . Number of children: 5  . Years of education: N/A   Occupational History  .      Retired   Social History Main Topics  . Smoking status: Never  Smoker  . Smokeless tobacco: Never Used  . Alcohol use No  . Drug use: No  . Sexual activity: No   Other Topics Concern  . Not on file   Social History Narrative   Lives in [place] with husband and son.  Retired from Designer, fashion/clothing    Family History  Problem Relation Age of Onset  . Diabetes Mellitus II Mother   . Cancer Brother        Colon  . Hypertension Father     ROS: no fevers or chills, productive cough, hemoptysis, dysphasia, odynophagia, melena, hematochezia, dysuria, hematuria, rash, seizure activity, orthopnea, PND, pedal edema, claudication. Remaining systems are negative.  Physical Exam: Well-developed obese in no acute distress.  Skin is warm and dry.  HEENT is normal.  Neck is supple.  Chest is clear to auscultation with normal expansion.  Cardiovascular exam is regular rate and rhythm.  Abdominal exam nontender or distended. No masses palpated. Extremities show no edema. neuro grossly intact  ECG- Sinus rhythm at a rate of 69. Left ventricular hypertrophy. Nonspecific ST changes. personally reviewed  A/P  1 Dilated nonischemic cardiomyopathy-continue beta blocker and entresto. Repeat echocardiogram.  2 chronic systolic congestive heart failure-continue present dose of diuretics. Patient appears to be euvolemic on examination. Check potassium and renal function. I instructed the patient on low-sodium diet and fluid restriction. I have asked her to weigh daily and take an additional 20 mg of Lasix for weight gain of 2-3 pounds.  3 hypertension-blood pressure is controlled. Continue present medications.  4 hyperlipidemia-continue statin.  5 coronary artery disease-continue aspirin and statin.  6 obesity-we discussed the importance of exercise, diet and weight loss.  7 prior ICD-patient is followed by electrophysiology.   Olga Millers, MD

## 2016-10-01 ENCOUNTER — Ambulatory Visit (INDEPENDENT_AMBULATORY_CARE_PROVIDER_SITE_OTHER): Payer: Medicare HMO | Admitting: Cardiology

## 2016-10-01 ENCOUNTER — Encounter: Payer: Self-pay | Admitting: Cardiology

## 2016-10-01 VITALS — BP 136/84 | HR 69 | Ht 64.0 in | Wt 226.2 lb

## 2016-10-01 DIAGNOSIS — E7800 Pure hypercholesterolemia, unspecified: Secondary | ICD-10-CM | POA: Diagnosis not present

## 2016-10-01 DIAGNOSIS — I10 Essential (primary) hypertension: Secondary | ICD-10-CM

## 2016-10-01 DIAGNOSIS — I5022 Chronic systolic (congestive) heart failure: Secondary | ICD-10-CM

## 2016-10-01 DIAGNOSIS — I428 Other cardiomyopathies: Secondary | ICD-10-CM

## 2016-10-01 LAB — BASIC METABOLIC PANEL
BUN: 12 mg/dL (ref 7–25)
CO2: 28 mmol/L (ref 20–31)
Calcium: 10.1 mg/dL (ref 8.6–10.4)
Chloride: 104 mmol/L (ref 98–110)
Creat: 1.03 mg/dL — ABNORMAL HIGH (ref 0.50–0.99)
GLUCOSE: 135 mg/dL — AB (ref 65–99)
POTASSIUM: 3.9 mmol/L (ref 3.5–5.3)
SODIUM: 142 mmol/L (ref 135–146)

## 2016-10-01 NOTE — Patient Instructions (Signed)

## 2016-10-15 ENCOUNTER — Other Ambulatory Visit: Payer: Self-pay

## 2016-10-15 ENCOUNTER — Ambulatory Visit (HOSPITAL_COMMUNITY): Payer: Medicare HMO | Attending: Cardiovascular Disease

## 2016-10-15 DIAGNOSIS — I5030 Unspecified diastolic (congestive) heart failure: Secondary | ICD-10-CM | POA: Insufficient documentation

## 2016-10-15 DIAGNOSIS — I420 Dilated cardiomyopathy: Secondary | ICD-10-CM | POA: Diagnosis not present

## 2016-10-15 DIAGNOSIS — I428 Other cardiomyopathies: Secondary | ICD-10-CM

## 2017-01-24 DIAGNOSIS — I10 Essential (primary) hypertension: Secondary | ICD-10-CM | POA: Diagnosis not present

## 2017-01-24 DIAGNOSIS — M5442 Lumbago with sciatica, left side: Secondary | ICD-10-CM | POA: Diagnosis not present

## 2017-01-24 DIAGNOSIS — I2583 Coronary atherosclerosis due to lipid rich plaque: Secondary | ICD-10-CM | POA: Diagnosis not present

## 2017-01-24 DIAGNOSIS — I2510 Atherosclerotic heart disease of native coronary artery without angina pectoris: Secondary | ICD-10-CM | POA: Diagnosis not present

## 2017-02-16 DIAGNOSIS — E119 Type 2 diabetes mellitus without complications: Secondary | ICD-10-CM | POA: Diagnosis not present

## 2017-02-16 DIAGNOSIS — E7849 Other hyperlipidemia: Secondary | ICD-10-CM | POA: Diagnosis not present

## 2017-02-28 ENCOUNTER — Ambulatory Visit (INDEPENDENT_AMBULATORY_CARE_PROVIDER_SITE_OTHER): Payer: Medicare HMO | Admitting: *Deleted

## 2017-02-28 DIAGNOSIS — I428 Other cardiomyopathies: Secondary | ICD-10-CM

## 2017-02-28 DIAGNOSIS — Z23 Encounter for immunization: Secondary | ICD-10-CM | POA: Diagnosis not present

## 2017-02-28 NOTE — Progress Notes (Signed)
Remote ICD transmission.   

## 2017-03-02 LAB — CUP PACEART REMOTE DEVICE CHECK
Battery Remaining Longevity: 112 mo
HighPow Impedance: 72 Ohm
Implantable Lead Implant Date: 20150903
Implantable Lead Location: 753860
Implantable Lead Model: 6935
Implantable Pulse Generator Implant Date: 20150903
Lead Channel Impedance Value: 342 Ohm
Lead Channel Pacing Threshold Amplitude: 1 V
Lead Channel Pacing Threshold Pulse Width: 0.4 ms
Lead Channel Sensing Intrinsic Amplitude: 17.75 mV
Lead Channel Setting Pacing Pulse Width: 0.4 ms
Lead Channel Setting Sensing Sensitivity: 0.3 mV
MDC IDC MSMT BATTERY VOLTAGE: 3.01 V
MDC IDC MSMT LEADCHNL RV IMPEDANCE VALUE: 399 Ohm
MDC IDC MSMT LEADCHNL RV SENSING INTR AMPL: 17.75 mV
MDC IDC SESS DTM: 20181014182636
MDC IDC SET LEADCHNL RV PACING AMPLITUDE: 2.25 V
MDC IDC STAT BRADY RV PERCENT PACED: 0.01 %

## 2017-03-04 ENCOUNTER — Encounter: Payer: Self-pay | Admitting: Cardiology

## 2017-03-14 DIAGNOSIS — R69 Illness, unspecified: Secondary | ICD-10-CM | POA: Diagnosis not present

## 2017-05-20 ENCOUNTER — Other Ambulatory Visit: Payer: Self-pay | Admitting: Cardiology

## 2017-05-20 DIAGNOSIS — I428 Other cardiomyopathies: Secondary | ICD-10-CM

## 2017-05-30 ENCOUNTER — Ambulatory Visit (INDEPENDENT_AMBULATORY_CARE_PROVIDER_SITE_OTHER): Payer: Medicare HMO | Admitting: *Deleted

## 2017-05-30 DIAGNOSIS — I428 Other cardiomyopathies: Secondary | ICD-10-CM | POA: Diagnosis not present

## 2017-06-01 LAB — CUP PACEART REMOTE DEVICE CHECK
Battery Voltage: 3.01 V
Brady Statistic RV Percent Paced: 0.01 %
Date Time Interrogation Session: 20190114093624
HighPow Impedance: 75 Ohm
Implantable Lead Implant Date: 20150903
Implantable Lead Location: 753860
Implantable Lead Model: 6935
Lead Channel Impedance Value: 418 Ohm
Lead Channel Setting Pacing Amplitude: 2 V
MDC IDC MSMT BATTERY REMAINING LONGEVITY: 109 mo
MDC IDC MSMT LEADCHNL RV IMPEDANCE VALUE: 342 Ohm
MDC IDC MSMT LEADCHNL RV PACING THRESHOLD AMPLITUDE: 1 V
MDC IDC MSMT LEADCHNL RV PACING THRESHOLD PULSEWIDTH: 0.4 ms
MDC IDC MSMT LEADCHNL RV SENSING INTR AMPL: 17.25 mV
MDC IDC MSMT LEADCHNL RV SENSING INTR AMPL: 17.25 mV
MDC IDC PG IMPLANT DT: 20150903
MDC IDC SET LEADCHNL RV PACING PULSEWIDTH: 0.4 ms
MDC IDC SET LEADCHNL RV SENSING SENSITIVITY: 0.3 mV

## 2017-06-01 NOTE — Progress Notes (Signed)
Remote ICD transmission.   

## 2017-06-03 ENCOUNTER — Encounter: Payer: Self-pay | Admitting: Cardiology

## 2017-08-29 ENCOUNTER — Ambulatory Visit (INDEPENDENT_AMBULATORY_CARE_PROVIDER_SITE_OTHER): Payer: Medicare HMO | Admitting: *Deleted

## 2017-08-29 DIAGNOSIS — I428 Other cardiomyopathies: Secondary | ICD-10-CM

## 2017-08-30 NOTE — Progress Notes (Signed)
Remote ICD transmission.   

## 2017-08-31 ENCOUNTER — Encounter: Payer: Self-pay | Admitting: Cardiology

## 2017-08-31 LAB — CUP PACEART REMOTE DEVICE CHECK
Battery Remaining Longevity: 105 mo
Brady Statistic RV Percent Paced: 0.01 %
HIGH POWER IMPEDANCE MEASURED VALUE: 72 Ohm
Implantable Lead Implant Date: 20150903
Implantable Lead Model: 6935
Lead Channel Impedance Value: 304 Ohm
Lead Channel Pacing Threshold Amplitude: 1 V
Lead Channel Pacing Threshold Pulse Width: 0.4 ms
Lead Channel Sensing Intrinsic Amplitude: 19.25 mV
Lead Channel Sensing Intrinsic Amplitude: 19.25 mV
MDC IDC LEAD LOCATION: 753860
MDC IDC MSMT BATTERY VOLTAGE: 3.01 V
MDC IDC MSMT LEADCHNL RV IMPEDANCE VALUE: 399 Ohm
MDC IDC PG IMPLANT DT: 20150903
MDC IDC SESS DTM: 20190415073424
MDC IDC SET LEADCHNL RV PACING AMPLITUDE: 2.25 V
MDC IDC SET LEADCHNL RV PACING PULSEWIDTH: 0.4 ms
MDC IDC SET LEADCHNL RV SENSING SENSITIVITY: 0.3 mV

## 2017-09-06 NOTE — Progress Notes (Signed)
Cardiology Office Note Date:  09/07/2017  Patient ID:  Zeppelin, Stinnett 1950/05/27, MRN [id number] PCP:  Joette Catching, MD  Cardiologist:  Dr. Jens Som Electrophysiologist: Dr. Johney Frame    Chief Complaint: annual EP visit  History of Present Illness: Anna Beltran is a 67 y.o. female with history of hypertensive CM w/ICD, Chronic CHF, NOD by cath in 2014, DM, HTN, obesity, comes in today to be seen for Dr. Johney Frame.   She was last seen by him in April 2017, more recently by myself 08/2016.   She was feeling well, reported she sleeps in a recliner but has for many years, denied any DOE,  [place], was active without changes ion her exertional capacity.  Denied any CP, palpitations, no dizziness, near suncope or syncope.  She was not doing daily weights, but had not noted any swelling.  No changes were made at that visit, device check was wnl  She saw Dr. Jens Som May 2018, again doing well, planned for update of her echo and labs.  Echo noted LVEF 25-30%, grade I DD, BMET looked OK  She is doing very well.  Continues to care for her grandchildren, though on a bit of a hiatus with her daughter in law at home post 4th baby only a week or so ago.  She is looking forward to summer months and getting outside more often.  She denies any kind of CP, palpitations.  No dizziness, near syncope or syncope.  She has not had any shocks, is doing her remotes.  She has no rest SOB, no symptoms of PND or orthopnea.  She has some degree of baseline DOE that is unchanged for years.  DEVICE information:  MDT single chamber ICD, implanted 01/17/14   Past Medical History:  Diagnosis Date  . Automatic implantable cardioverter-defibrillator in situ 01/17/2014   BY DR ALLRED  . Bruit    Bilat carotid duplex 09/05/12 CONCLUSION: Mild, less than or equal to 39%, left internal carotid artery stenosis.  . CHF (congestive heart failure) (HCC)    MPI 09/05/12 NORMAL,  showed no scar & LVEF is 20-25%. ECHO 12/11/12 LV is markedly dilated, Overall LV systolic function severely impaired with EF = 25-30%, pseudonormal LV filling pattern (consistant with elevated LA pressure).  . Diabetes mellitus, type 2 (HCC)    TYPE 2  . Essential hypertension, benign   . Hyperlipidemia, mixed   . Hypersomnia, unspecified   . Hypertensive cardiomyopathy (HCC)    By report there is poor compliance w/treatment, poor tolerance of treatment & poor symptom control. Symptoms include dyspnea & DOE.  Marland Kitchen Obesity   . PUD (peptic ulcer disease)   . Unspecified menopausal and postmenopausal disorder     Past Surgical History:  Procedure Laterality Date  . CARDIAC DEFIBRILLATOR PLACEMENT  01/17/2014   DR Johney Frame  . IMPLANTABLE CARDIOVERTER DEFIBRILLATOR IMPLANT N/A 01/17/2014   Procedure: IMPLANTABLE CARDIOVERTER DEFIBRILLATOR IMPLANT;  Surgeon: Gardiner Rhyme, MD;  Location: [facility] CATH LAB;  Service: Cardiovascular;  Laterality: N/A;  . LEFT AND RIGHT HEART CATHETERIZATION WITH CORONARY ANGIOGRAM N/A 02/27/2013   Procedure: LEFT AND RIGHT HEART CATHETERIZATION WITH CORONARY ANGIOGRAM;  Surgeon: Rollene Rotunda, MD;  Location: [facility] CATH LAB;  Service: Cardiovascular;  Laterality: N/A;  . none      Current Outpatient Medications  Medication Sig Dispense Refill  . aspirin 81 MG tablet Take 81 mg by mouth daily.    Marland Kitchen atorvastatin (LIPITOR) 80 MG  tablet Take 1 tablet (80 mg total) by mouth daily. 30 tablet 12  . carvedilol (COREG) 25 MG tablet Take 1 tablet (25 mg total) by mouth 2 (two) times daily with a meal. 60 tablet 1  . ENTRESTO 97-103 MG TAKE ONE TABLET BY MOUTH TWICE DAILY 60 tablet 1  . FARXIGA 10 MG TABS tablet Take 1 tablet by mouth daily.    . furosemide (LASIX) 20 MG tablet Take 2 tablets (40 mg total) by mouth daily. 180 tablet 3  . hydrALAZINE (APRESOLINE) 100 MG tablet TAKE ONE TABLET BY MOUTH THREE TIMES DAILY 270 tablet 2  . LINZESS 145 MCG CAPS capsule Take 1 tablet by  mouth daily.    . metFORMIN (GLUCOPHAGE) 1000 MG tablet Take 1,000 mg by mouth 2 (two) times daily.    Marland Kitchen omeprazole (PRILOSEC) 20 MG capsule Take 20 mg by mouth 2 (two) times daily.    . Polyvinyl Alcohol-Povidone (REFRESH OP) Place 1 drop into both eyes 2 (two) times daily as needed (for dry eyes).     Marland Kitchen spironolactone (ALDACTONE) 25 MG tablet Take 1 tablet (25 mg total) by mouth daily. 90 tablet 1   No current facility-administered medications for this visit.     Allergies:   Isosorbide nitrate   Social History:  The patient  reports that she has never smoked. She has never used smokeless tobacco. She reports that she does not drink alcohol or use drugs.   Family History:  The patient's family history includes Cancer in her brother; Diabetes Mellitus II in her mother; Hypertension in her father.  ROS:  Please see the history of present illness.  All other systems are reviewed and otherwise negative.   PHYSICAL EXAM:  VS:  BP (!) 164/84   Pulse 71   Ht 5\' 4"  (1.626 m)   Wt 232 lb 4 oz (105.3 kg)   SpO2 93%   BMI 39.87 kg/m  BMI: Body mass index is 39.87 kg/m. Well nourished, well developed, in no acute distress  HEENT: normocephalic, atraumatic  Neck: no JVD, carotid bruits or masses Cardiac:  RRR; no significant murmurs, no rubs, or gallops Lungs: CTA b/l, no wheezing, rhonchi or rales  Abd: soft, nontender MS: no deformity or atrophy Ext:  no edema  Skin: warm and dry, no rash Neuro:  No gross deficits appreciated Psych: euthymic mood, full affect  ICD site is stable, no tethering or discomfort   EKG:  Done today and reviewed by myself SR, 71bpm, nonspecific T changes, appear similar to older EKGs ICD interrogation done today and reviewed by myself: battery and lead measurements are good,100% VS, presenting VS 74bpm, no device observations.  10/15/16: TTE Study Conclusions - Left ventricle: The cavity size was moderately dilated. Wall   thickness was normal. Systolic  function was severely reduced. The   estimated ejection fraction was in the range of 25% to 30%.   Severe diffuse hypokinesis with no identifiable regional   variations. Doppler parameters are consistent with abnormal left   ventricular relaxation (grade 1 diastolic dysfunction). - Pulmonary arteries: PA peak pressure: 31 mm Hg (S).   12/11/12: TTE LVEF 25-30% Mild MR  09/05/12: Lexiscan stress myoview Perfusion imaging is mnormal, LVEF 24%  Recent Labs: 10/01/2016: BUN 12; Creat 1.03; Potassium 3.9; Sodium 142  No results found for requested labs within last 8760 hours.   CrCl cannot be calculated (Patient's most recent lab result is older than the maximum 21 days allowed.).   Wt Readings  from Last 3 Encounters:  09/07/17 232 lb 4 oz (105.3 kg)  10/01/16 226 lb 3.2 oz (102.6 kg)  08/24/16 236 lb (107 kg)     Other studies reviewed: Additional studies/records reviewed today include: summarized above  ASSESSMENT AND PLAN:  1. Hypertensive heart disease, CM 2. Chronic CHF      exam appears euvolemic, optiVol looks good.  weight is up ffrom last year, though no symptoms or exam findings to suggest fluid OL.  Discussed healthy diet, exercise, weight loss     On BB/ARB (entresto), aldactone, lasix     c/w Dr. Genice Rouge  BMET today givenmeds  3. ICD     stable device function, no changes made  4. HTN     Unusually high here today, she reports 120-140/70's at home      no changes made        Disposition: Continue Q29mo remotes, and annual in-clinic EP visits, sooner if needed.  She thinks she is due to see Dr. Jens Som, she will schedule to see him.    Current medicines are reviewed at length with the patient today.  The patient did not have any concerns regarding medicines.  Anna Blonder, PA-C 09/07/2017 9:20 AM     [facility] [address] [place] [address] [phone] (office)  [phone] (fax)

## 2017-09-07 ENCOUNTER — Encounter: Payer: Self-pay | Admitting: Physician Assistant

## 2017-09-07 ENCOUNTER — Ambulatory Visit: Payer: Medicare HMO | Admitting: Physician Assistant

## 2017-09-07 VITALS — BP 164/84 | HR 71 | Ht 64.0 in | Wt 232.2 lb

## 2017-09-07 DIAGNOSIS — Z79899 Other long term (current) drug therapy: Secondary | ICD-10-CM | POA: Diagnosis not present

## 2017-09-07 DIAGNOSIS — I10 Essential (primary) hypertension: Secondary | ICD-10-CM | POA: Diagnosis not present

## 2017-09-07 DIAGNOSIS — I5022 Chronic systolic (congestive) heart failure: Secondary | ICD-10-CM

## 2017-09-07 DIAGNOSIS — Z95810 Presence of automatic (implantable) cardiac defibrillator: Secondary | ICD-10-CM

## 2017-09-07 DIAGNOSIS — I428 Other cardiomyopathies: Secondary | ICD-10-CM

## 2017-09-07 LAB — BASIC METABOLIC PANEL
BUN/Creatinine Ratio: 11 — ABNORMAL LOW (ref 12–28)
BUN: 9 mg/dL (ref 8–27)
CALCIUM: 10.1 mg/dL (ref 8.7–10.3)
CO2: 27 mmol/L (ref 20–29)
Chloride: 99 mmol/L (ref 96–106)
Creatinine, Ser: 0.84 mg/dL (ref 0.57–1.00)
GFR calc non Af Amer: 72 mL/min/{1.73_m2} (ref >59)
GFR, EST AFRICAN AMERICAN: 83 mL/min/{1.73_m2} (ref >59)
Glucose: 195 mg/dL — ABNORMAL HIGH (ref 65–99)
Potassium: 3.9 mmol/L (ref 3.5–5.2)
Sodium: 141 mmol/L (ref 134–144)

## 2017-09-07 NOTE — Patient Instructions (Addendum)
Medication Instructions:    Your physician recommends that you continue on your current medications as directed. Please refer to the Current Medication list given to you today.   If you need a refill on your cardiac medications before your next appointment, please call your pharmacy.  Labwork:  BMET TODAY    Testing/Procedures: NONE ORDERED  TODAY'    Follow-Up:   WITH DR CRENSHAW NEXT AVAILABLE    Your physician wants you to follow-up in: ONE YEAR WITH  [name]  You will receive a reminder letter in the mail two months in advance. If you don't receive a letter, please call our office to schedule the follow-up appointment.   Remote monitoring is used to monitor your Pacemaker of ICD from home. This monitoring reduces the number of office visits required to check your device to one time per year. It allows [place] to keep an eye on the functioning of your device to ensure it is working properly. You are scheduled for a device check from home on .  11-28-17. You may send your transmission at any time that day. If you have a wireless device, the transmission will be sent automatically. After your physician reviews your transmission, you will receive a postcard with your next transmission date.     Any Other Special Instructions Will Be Listed Below (If Applicable).

## 2017-09-14 ENCOUNTER — Telehealth: Payer: Self-pay | Admitting: *Deleted

## 2017-09-14 NOTE — Telephone Encounter (Signed)
-----   Message from [facility] [place] sent at 09/08/2017  9:35 AM EDT ----- Kidney function and electrolytes look good.  Non-fasting sugar is high, keep on eye on diet f/u diabetes with her PMD.  Thanks renee

## 2017-09-14 NOTE — Telephone Encounter (Signed)
LMOVM OF NORMAL RESULTS  AND CONTACT NUMBER IF NEED TO CALL BACK FOR ANY QUESTIONS.

## 2017-09-23 ENCOUNTER — Other Ambulatory Visit: Payer: Self-pay | Admitting: Cardiology

## 2017-09-23 DIAGNOSIS — I428 Other cardiomyopathies: Secondary | ICD-10-CM

## 2017-09-23 DIAGNOSIS — I2510 Atherosclerotic heart disease of native coronary artery without angina pectoris: Secondary | ICD-10-CM

## 2017-09-23 DIAGNOSIS — I2583 Coronary atherosclerosis due to lipid rich plaque: Secondary | ICD-10-CM

## 2017-09-23 NOTE — Telephone Encounter (Signed)
REFILL 

## 2017-11-28 ENCOUNTER — Ambulatory Visit (INDEPENDENT_AMBULATORY_CARE_PROVIDER_SITE_OTHER): Payer: Medicare HMO | Admitting: *Deleted

## 2017-11-28 DIAGNOSIS — I428 Other cardiomyopathies: Secondary | ICD-10-CM

## 2017-11-28 DIAGNOSIS — I5022 Chronic systolic (congestive) heart failure: Secondary | ICD-10-CM | POA: Diagnosis not present

## 2017-11-28 NOTE — Progress Notes (Signed)
Remote ICD transmission.   

## 2017-11-29 LAB — CUP PACEART REMOTE DEVICE CHECK
Battery Remaining Longevity: 101 mo
Brady Statistic RV Percent Paced: 0 %
HIGH POWER IMPEDANCE MEASURED VALUE: 67 Ohm
Implantable Lead Location: 753860
Implantable Lead Model: 6935
Lead Channel Impedance Value: 342 Ohm
Lead Channel Pacing Threshold Amplitude: 1 V
Lead Channel Setting Pacing Amplitude: 2 V
Lead Channel Setting Pacing Pulse Width: 0.4 ms
Lead Channel Setting Sensing Sensitivity: 0.3 mV
MDC IDC LEAD IMPLANT DT: 20150903
MDC IDC MSMT BATTERY VOLTAGE: 3.01 V
MDC IDC MSMT LEADCHNL RV IMPEDANCE VALUE: 418 Ohm
MDC IDC MSMT LEADCHNL RV PACING THRESHOLD PULSEWIDTH: 0.4 ms
MDC IDC MSMT LEADCHNL RV SENSING INTR AMPL: 18.125 mV
MDC IDC MSMT LEADCHNL RV SENSING INTR AMPL: 18.125 mV
MDC IDC PG IMPLANT DT: 20150903
MDC IDC SESS DTM: 20190715041703

## 2017-11-29 NOTE — Progress Notes (Signed)
HPI: FU congestive heart failure/cardiomyopathy. Previously followed in [facility]. Carotid Dopplers in April 2014 showed less than 39% left carotid stenosis. Chest CT in March of 2014 showed no pulmonary embolus, small to moderate right effusion which was felt to be partially loculated. Cardiac catheterization in October of 2014 showed nonobstructive coronary disease and an ejection fraction of 30%. Right heart pressures were normal. Apparently patient has had poor compliance with treatment and poor tolerance of treatment based on outside records. ICD placed 9/15.  Last echocardiogram June 2018 showed ejection fraction 25 to 30%, grade 1 diastolic dysfunction and moderate left ventricular enlargement.  Since she was last seen, the patient has dyspnea with more extreme activities but not with routine activities. It is relieved with rest. It is not associated with chest pain. There is no orthopnea, PND or pedal edema. There is no syncope or palpitations. There is no exertional chest pain.   Current Outpatient Medications  Medication Sig Dispense Refill  . aspirin 81 MG tablet Take 81 mg by mouth daily.    Marland Kitchen atorvastatin (LIPITOR) 80 MG tablet Take 1 tablet (80 mg total) by mouth daily. 30 tablet 12  . carvedilol (COREG) 25 MG tablet Take 1 tablet (25 mg total) by mouth 2 (two) times daily with a meal. 60 tablet 1  . ENTRESTO 97-103 MG TAKE 1 TABLET BY MOUTH TWICE DAILY 180 tablet 3  . FARXIGA 10 MG TABS tablet Take 1 tablet by mouth daily.    . furosemide (LASIX) 20 MG tablet Take 2 tablets (40 mg total) by mouth daily. 180 tablet 3  . hydrALAZINE (APRESOLINE) 100 MG tablet TAKE 1 TABLET BY MOUTH THREE TIMES DAILY 270 tablet 3  . LINZESS 145 MCG CAPS capsule Take 1 tablet by mouth daily.    . metFORMIN (GLUCOPHAGE) 1000 MG tablet Take 1,000 mg by mouth 2 (two) times daily.    Marland Kitchen omeprazole (PRILOSEC) 20 MG capsule Take 20 mg by mouth 2 (two) times daily.    . Polyvinyl Alcohol-Povidone  (REFRESH OP) Place 1 drop into both eyes 2 (two) times daily as needed (for dry eyes).     Marland Kitchen spironolactone (ALDACTONE) 25 MG tablet Take 1 tablet (25 mg total) by mouth daily. 90 tablet 1   No current facility-administered medications for this visit.      Past Medical History:  Diagnosis Date  . Automatic implantable cardioverter-defibrillator in situ 01/17/2014   BY DR ALLRED  . Bruit    Bilat carotid duplex 09/05/12 CONCLUSION: Mild, less than or equal to 39%, left internal carotid artery stenosis.  . CHF (congestive heart failure) (HCC)    MPI 09/05/12 NORMAL, showed no scar & LVEF is 20-25%. ECHO 12/11/12 LV is markedly dilated, Overall LV systolic function severely impaired with EF = 25-30%, pseudonormal LV filling pattern (consistant with elevated LA pressure).  . Diabetes mellitus, type 2 (HCC)    TYPE 2  . Essential hypertension, benign   . Hyperlipidemia, mixed   . Hypersomnia, unspecified   . Hypertensive cardiomyopathy (HCC)    By report there is poor compliance w/treatment, poor tolerance of treatment & poor symptom control. Symptoms include dyspnea & DOE.  Marland Kitchen Obesity   . PUD (peptic ulcer disease)   . Unspecified menopausal and postmenopausal disorder     Past Surgical History:  Procedure Laterality Date  . CARDIAC DEFIBRILLATOR PLACEMENT  01/17/2014   DR Johney Frame  . IMPLANTABLE CARDIOVERTER DEFIBRILLATOR IMPLANT N/A 01/17/2014   Procedure: IMPLANTABLE CARDIOVERTER  DEFIBRILLATOR IMPLANT;  Surgeon: Gardiner Rhyme, MD;  Location: [facility] CATH LAB;  Service: Cardiovascular;  Laterality: N/A;  . LEFT AND RIGHT HEART CATHETERIZATION WITH CORONARY ANGIOGRAM N/A 02/27/2013   Procedure: LEFT AND RIGHT HEART CATHETERIZATION WITH CORONARY ANGIOGRAM;  Surgeon: Rollene Rotunda, MD;  Location: [facility] CATH LAB;  Service: Cardiovascular;  Laterality: N/A;  . none      Social History   Socioeconomic History  . Marital status: Married    Spouse name: Not on file  . Number of children: 5  .  Years of education: Not on file  . Highest education level: Not on file  Occupational History    Comment: Retired  Engineer, production  . Financial resource strain: Not on file  . Food insecurity:    Worry: Not on file    Inability: Not on file  . Transportation needs:    Medical: Not on file    Non-medical: Not on file  Tobacco Use  . Smoking status: Never Smoker  . Smokeless tobacco: Never Used  Substance and Sexual Activity  . Alcohol use: No  . Drug use: No  . Sexual activity: Never  Lifestyle  . Physical activity:    Days per week: Not on file    Minutes per session: Not on file  . Stress: Not on file  Relationships  . Social connections:    Talks on phone: Not on file    Gets together: Not on file    Attends religious service: Not on file    Active member of club or organization: Not on file    Attends meetings of clubs or organizations: Not on file    Relationship status: Not on file  . Intimate partner violence:    Fear of current or ex partner: Not on file    Emotionally abused: Not on file    Physically abused: Not on file    Forced sexual activity: Not on file  Other Topics Concern  . Not on file  Social History Narrative   Lives in [place] with husband and son.  Retired from Designer, fashion/clothing    Family History  Problem Relation Age of Onset  . Diabetes Mellitus II Mother   . Cancer Brother        Colon  . Hypertension Father     ROS: Sciatica but no fevers or chills, productive cough, hemoptysis, dysphasia, odynophagia, melena, hematochezia, dysuria, hematuria, rash, seizure activity, orthopnea, PND, pedal edema, claudication. Remaining systems are negative.  Physical Exam: Well-developed obese in no acute distress.  Skin is warm and dry.  HEENT is normal.  Neck is supple.  Chest is clear to auscultation with normal expansion.  Cardiovascular exam is regular rate and rhythm.  Abdominal exam nontender or distended. No masses palpated. Extremities show no  edema. neuro grossly intact   A/P  1 nonischemic cardiomyopathy-LV function remain decreased at time of previous echocardiogram.  Continue medical therapy with Entresto and beta-blocker.  2 chronic systolic congestive heart failure-patient doing well from a symptomatic standpoint.  Continue present dose of diuretic.  Continue fluid restriction and low-sodium diet.  Check potassium and renal function.   3 hypertension-blood pressure is elevated.  She did not tolerate isosorbide as it caused headaches previously.  Add amlodipine 5 mg daily and follow blood pressure.  4 hyperlipidemia-continue statin.  Check lipids and liver.  5 coronary artery disease-patient denies chest pain.  Continue medical therapy with aspirin and statin.  6 ICD-managed by electrophysiology.  7 morbid  obesity-needs diet, exercise and weight loss.  Olga Millers, MD

## 2017-11-30 ENCOUNTER — Encounter: Payer: Self-pay | Admitting: Cardiology

## 2017-12-09 ENCOUNTER — Ambulatory Visit: Payer: Medicare HMO | Admitting: Cardiology

## 2017-12-09 ENCOUNTER — Encounter: Payer: Self-pay | Admitting: Cardiology

## 2017-12-09 VITALS — BP 157/85 | HR 76 | Ht 64.0 in | Wt 235.0 lb

## 2017-12-09 DIAGNOSIS — I5022 Chronic systolic (congestive) heart failure: Secondary | ICD-10-CM

## 2017-12-09 DIAGNOSIS — I10 Essential (primary) hypertension: Secondary | ICD-10-CM | POA: Diagnosis not present

## 2017-12-09 DIAGNOSIS — I428 Other cardiomyopathies: Secondary | ICD-10-CM

## 2017-12-09 DIAGNOSIS — E7800 Pure hypercholesterolemia, unspecified: Secondary | ICD-10-CM | POA: Diagnosis not present

## 2017-12-09 LAB — COMPREHENSIVE METABOLIC PANEL
ALK PHOS: 69 IU/L (ref 39–117)
ALT: 13 IU/L (ref 0–32)
AST: 7 IU/L (ref 0–40)
Albumin/Globulin Ratio: 1.4 (ref 1.2–2.2)
Albumin: 3.9 g/dL (ref 3.6–4.8)
BUN/Creatinine Ratio: 15 (ref 12–28)
BUN: 11 mg/dL (ref 8–27)
Bilirubin Total: 0.2 mg/dL (ref 0.0–1.2)
CALCIUM: 10.3 mg/dL (ref 8.7–10.3)
CO2: 23 mmol/L (ref 20–29)
CREATININE: 0.75 mg/dL (ref 0.57–1.00)
Chloride: 104 mmol/L (ref 96–106)
GFR calc Af Amer: 95 mL/min/{1.73_m2} (ref >59)
GFR, EST NON AFRICAN AMERICAN: 82 mL/min/{1.73_m2} (ref >59)
GLOBULIN, TOTAL: 2.7 g/dL (ref 1.5–4.5)
Glucose: 178 mg/dL — ABNORMAL HIGH (ref 65–99)
POTASSIUM: 4.4 mmol/L (ref 3.5–5.2)
SODIUM: 140 mmol/L (ref 134–144)
Total Protein: 6.6 g/dL (ref 6.0–8.5)

## 2017-12-09 LAB — LIPID PANEL
CHOL/HDL RATIO: 4.5 ratio — AB (ref 0.0–4.4)
Cholesterol, Total: 183 mg/dL (ref 100–199)
HDL: 41 mg/dL (ref >39)
LDL CALC: 111 mg/dL — AB (ref 0–99)
Triglycerides: 156 mg/dL — ABNORMAL HIGH (ref 0–149)
VLDL CHOLESTEROL CAL: 31 mg/dL (ref 5–40)

## 2017-12-09 MED ORDER — AMLODIPINE BESYLATE 5 MG PO TABS: 5.0000 mg | ORAL_TABLET | Freq: Every day | ORAL | 3 refills | Status: DC

## 2017-12-09 NOTE — Patient Instructions (Signed)
Medication Instructions:   START AMLODIPINE 5 MG ONCE DAILY  Labwork:  Your physician recommends that you HAVE LAB WORK TODAY  Follow-Up:  Your physician wants you to follow-up in: ONE YEAR WITH DR CRENSHAW You will receive a reminder letter in the mail two months in advance. If you don't receive a letter, please call our office to schedule the follow-up appointment.   If you need a refill on your cardiac medications before your next appointment, please call your pharmacy.    

## 2017-12-14 ENCOUNTER — Telehealth: Payer: Self-pay | Admitting: *Deleted

## 2017-12-14 DIAGNOSIS — E7800 Pure hypercholesterolemia, unspecified: Secondary | ICD-10-CM

## 2017-12-14 NOTE — Telephone Encounter (Addendum)
Left message for pt to call   ----- Message from Lewayne Bunting, MD sent at 12/09/2017  5:02 PM EDT ----- Add zetia 10 mg daily; lipids and liver 4 weeks Olga Millers

## 2017-12-22 NOTE — Telephone Encounter (Signed)
Left message for pt to call.

## 2017-12-28 NOTE — Telephone Encounter (Signed)
Left message for pt to call.

## 2018-01-03 NOTE — Telephone Encounter (Signed)
Follow Up ° °Pt returning call for nurse °

## 2018-01-03 NOTE — Telephone Encounter (Signed)
Left message for pt to call.

## 2018-01-05 MED ORDER — EZETIMIBE 10 MG PO TABS: 10.0000 mg | ORAL_TABLET | Freq: Every day | ORAL | 3 refills | Status: DC

## 2018-01-05 NOTE — Telephone Encounter (Signed)
Spoke with pt, Aware of dr crenshaw's recommendations. New script sent to the pharmacy and Lab orders mailed to the pt  

## 2018-02-27 ENCOUNTER — Ambulatory Visit (INDEPENDENT_AMBULATORY_CARE_PROVIDER_SITE_OTHER): Payer: Medicare HMO | Admitting: *Deleted

## 2018-02-27 DIAGNOSIS — I428 Other cardiomyopathies: Secondary | ICD-10-CM | POA: Diagnosis not present

## 2018-02-28 NOTE — Progress Notes (Signed)
Remote ICD transmission.   

## 2018-03-02 ENCOUNTER — Encounter: Payer: Self-pay | Admitting: Cardiology

## 2018-03-24 LAB — CUP PACEART REMOTE DEVICE CHECK
Battery Voltage: 3.01 V
Date Time Interrogation Session: 20191014084224
HIGH POWER IMPEDANCE MEASURED VALUE: 76 Ohm
Implantable Lead Implant Date: 20150903
Implantable Pulse Generator Implant Date: 20150903
Lead Channel Pacing Threshold Amplitude: 1 V
Lead Channel Pacing Threshold Pulse Width: 0.4 ms
Lead Channel Sensing Intrinsic Amplitude: 18.125 mV
Lead Channel Sensing Intrinsic Amplitude: 18.125 mV
Lead Channel Setting Pacing Pulse Width: 0.4 ms
MDC IDC LEAD LOCATION: 753860
MDC IDC MSMT BATTERY REMAINING LONGEVITY: 95 mo
MDC IDC MSMT LEADCHNL RV IMPEDANCE VALUE: 342 Ohm
MDC IDC MSMT LEADCHNL RV IMPEDANCE VALUE: 399 Ohm
MDC IDC SET LEADCHNL RV PACING AMPLITUDE: 2 V
MDC IDC SET LEADCHNL RV SENSING SENSITIVITY: 0.3 mV
MDC IDC STAT BRADY RV PERCENT PACED: 0.01 %

## 2018-05-29 ENCOUNTER — Ambulatory Visit (INDEPENDENT_AMBULATORY_CARE_PROVIDER_SITE_OTHER): Payer: Medicare HMO

## 2018-05-29 DIAGNOSIS — I428 Other cardiomyopathies: Secondary | ICD-10-CM

## 2018-05-30 NOTE — Progress Notes (Signed)
Remote ICD transmission.   

## 2018-05-31 LAB — CUP PACEART REMOTE DEVICE CHECK
Battery Remaining Longevity: 89 mo
HIGH POWER IMPEDANCE MEASURED VALUE: 73 Ohm
Implantable Pulse Generator Implant Date: 20150903
Lead Channel Impedance Value: 342 Ohm
Lead Channel Pacing Threshold Amplitude: 1.125 V
Lead Channel Pacing Threshold Pulse Width: 0.4 ms
Lead Channel Sensing Intrinsic Amplitude: 17.625 mV
Lead Channel Setting Pacing Amplitude: 2.25 V
Lead Channel Setting Pacing Pulse Width: 0.4 ms
MDC IDC LEAD IMPLANT DT: 20150903
MDC IDC LEAD LOCATION: 753860
MDC IDC MSMT BATTERY VOLTAGE: 2.96 V
MDC IDC MSMT LEADCHNL RV IMPEDANCE VALUE: 399 Ohm
MDC IDC MSMT LEADCHNL RV SENSING INTR AMPL: 17.625 mV
MDC IDC SESS DTM: 20200114192832
MDC IDC SET LEADCHNL RV SENSING SENSITIVITY: 0.3 mV
MDC IDC STAT BRADY RV PERCENT PACED: 0.01 %

## 2018-06-29 ENCOUNTER — Other Ambulatory Visit (HOSPITAL_COMMUNITY): Payer: Self-pay | Admitting: Respiratory Therapy

## 2018-06-29 DIAGNOSIS — R0602 Shortness of breath: Secondary | ICD-10-CM

## 2018-07-21 ENCOUNTER — Other Ambulatory Visit: Payer: Self-pay | Admitting: Pulmonary Disease

## 2018-07-24 ENCOUNTER — Other Ambulatory Visit (HOSPITAL_COMMUNITY): Payer: Self-pay | Admitting: Pulmonary Disease

## 2018-07-24 ENCOUNTER — Ambulatory Visit (HOSPITAL_COMMUNITY)
Admission: RE | Admit: 2018-07-24 | Discharge: 2018-07-24 | Disposition: A | Discharge status: Home / Self Care | Payer: Medicare HMO | Source: Ambulatory Visit | Attending: Pulmonary Disease | Admitting: Pulmonary Disease

## 2018-07-24 DIAGNOSIS — R942 Abnormal results of pulmonary function studies: Secondary | ICD-10-CM | POA: Insufficient documentation

## 2018-07-26 ENCOUNTER — Ambulatory Visit (HOSPITAL_COMMUNITY)
Admission: RE | Admit: 2018-07-26 | Discharge: 2018-07-26 | Disposition: A | Discharge status: Home / Self Care | Payer: Medicare HMO | Source: Ambulatory Visit | Attending: Pulmonary Disease | Admitting: Pulmonary Disease

## 2018-07-26 ENCOUNTER — Other Ambulatory Visit: Payer: Self-pay

## 2018-07-26 DIAGNOSIS — R0602 Shortness of breath: Secondary | ICD-10-CM

## 2018-07-26 LAB — PULMONARY FUNCTION TEST
DL/VA % pred: 96 %
DL/VA: 3.99 ml/min/mmHg/L
DLCO UNC % PRED: 67 %
DLCO UNC: 13.23 ml/min/mmHg
FEF 25-75 Post: 1.4 L/sec
FEF 25-75 Pre: 0.8 L/sec
FEF2575-%CHANGE-POST: 75 %
FEF2575-%PRED-PRE: 45 %
FEF2575-%Pred-Post: 80 %
FEV1-%Change-Post: 11 %
FEV1-%PRED-POST: 61 %
FEV1-%Pred-Pre: 54 %
FEV1-POST: 1.14 L
FEV1-Pre: 1.02 L
FEV1FVC-%CHANGE-POST: 3 %
FEV1FVC-%Pred-Pre: 98 %
FEV6-%Change-Post: 8 %
FEV6-%PRED-PRE: 56 %
FEV6-%Pred-Post: 61 %
FEV6-PRE: 1.3 L
FEV6-Post: 1.41 L
FEV6FVC-%Change-Post: 0 %
FEV6FVC-%PRED-PRE: 102 %
FEV6FVC-%Pred-Post: 102 %
FVC-%CHANGE-POST: 8 %
FVC-%PRED-PRE: 55 %
FVC-%Pred-Post: 59 %
FVC-PRE: 1.33 L
FVC-Post: 1.44 L
POST FEV1/FVC RATIO: 79 %
POST FEV6/FVC RATIO: 98 %
Pre FEV1/FVC ratio: 77 %
Pre FEV6/FVC Ratio: 98 %
RV % PRED: 117 %
RV: 2.56 L
TLC % PRED: 82 %
TLC: 4.19 L

## 2018-07-26 MED ORDER — ALBUTEROL SULFATE (2.5 MG/3ML) 0.083% IN NEBU
2.5000 mg | INHALATION_SOLUTION | Freq: Once | RESPIRATORY_TRACT | Status: AC
  Administered 2018-07-26: 2.5 mg via RESPIRATORY_TRACT

## 2018-08-28 ENCOUNTER — Ambulatory Visit (INDEPENDENT_AMBULATORY_CARE_PROVIDER_SITE_OTHER): Payer: Medicare HMO | Admitting: *Deleted

## 2018-08-28 ENCOUNTER — Other Ambulatory Visit: Payer: Self-pay

## 2018-08-28 DIAGNOSIS — I428 Other cardiomyopathies: Secondary | ICD-10-CM

## 2018-08-28 DIAGNOSIS — I5022 Chronic systolic (congestive) heart failure: Secondary | ICD-10-CM

## 2018-08-28 LAB — CUP PACEART REMOTE DEVICE CHECK
Battery Remaining Longevity: 87 mo
Battery Voltage: 2.99 V
Brady Statistic RV Percent Paced: 0.01 %
Date Time Interrogation Session: 20200413152047
HighPow Impedance: 70 Ohm
Implantable Lead Implant Date: 20150903
Implantable Lead Location: 753860
Implantable Lead Model: 6935
Implantable Pulse Generator Implant Date: 20150903
Lead Channel Impedance Value: 304 Ohm
Lead Channel Impedance Value: 399 Ohm
Lead Channel Pacing Threshold Amplitude: 1 V
Lead Channel Pacing Threshold Pulse Width: 0.4 ms
Lead Channel Sensing Intrinsic Amplitude: 15.625 mV
Lead Channel Sensing Intrinsic Amplitude: 15.625 mV
Lead Channel Setting Pacing Amplitude: 2 V
Lead Channel Setting Pacing Pulse Width: 0.4 ms
Lead Channel Setting Sensing Sensitivity: 0.3 mV

## 2018-09-07 ENCOUNTER — Telehealth: Payer: Self-pay

## 2018-09-07 NOTE — Telephone Encounter (Signed)
Left message regarding appt on 09/11/18. 

## 2018-09-08 NOTE — Progress Notes (Signed)
Remote ICD transmission.   

## 2018-09-11 ENCOUNTER — Telehealth (INDEPENDENT_AMBULATORY_CARE_PROVIDER_SITE_OTHER): Payer: Medicare HMO | Admitting: Internal Medicine

## 2018-09-11 ENCOUNTER — Other Ambulatory Visit: Payer: Self-pay

## 2018-09-11 DIAGNOSIS — I5022 Chronic systolic (congestive) heart failure: Secondary | ICD-10-CM

## 2018-09-11 DIAGNOSIS — I10 Essential (primary) hypertension: Secondary | ICD-10-CM | POA: Diagnosis not present

## 2018-09-11 DIAGNOSIS — I428 Other cardiomyopathies: Secondary | ICD-10-CM | POA: Diagnosis not present

## 2018-09-11 NOTE — Progress Notes (Signed)
Electrophysiology TeleHealth Note   Due to national recommendations of social distancing due to COVID 19, an audio/video telehealth visit is felt to be most appropriate for this patient at this time.  Verbal consent was obtained from the patient today. We used doximity for the visit today.   Date:  09/11/2018   ID:  Anna Beltran, DOB 12/08/1950, MRN [id number]  Location: patient's home  Provider location: [address], [place]  Evaluation Performed: Follow-up visit  PCP:  Joette Catching, MD  Cardiologist:  Dr Jens Som Electrophysiologist:  Dr Johney Frame  Chief Complaint:  SOB  History of Present Illness:    Anna Beltran is a 68 y.o. female who presents via audio/video conferencing for a telehealth visit today.  Since last being seen in our clinic, the patient reports doing reasonably well.  She has chronic SOB.  She is on O2 at home.  Today, she denies symptoms of palpitations, chest pain,   lower extremity edema, dizziness, presyncope, or syncope.  The patient is otherwise without complaint today.  The patient denies symptoms of fevers, chills, cough, or new SOB worrisome for COVID 19.  Past Medical History:  Diagnosis Date  . Automatic implantable cardioverter-defibrillator in situ 01/17/2014   BY DR Chanoch Mccleery  . Bruit    Bilat carotid duplex 09/05/12 CONCLUSION: Mild, less than or equal to 39%, left internal carotid artery stenosis.  . CHF (congestive heart failure) (HCC)    MPI 09/05/12 NORMAL, showed no scar & LVEF is 20-25%. ECHO 12/11/12 LV is markedly dilated, Overall LV systolic function severely impaired with EF = 25-30%, pseudonormal LV filling pattern (consistant with elevated LA pressure).  . Diabetes mellitus, type 2 (HCC)    TYPE 2  . Essential hypertension, benign   . Hyperlipidemia, mixed   . Hypersomnia, unspecified   . Hypertensive cardiomyopathy (HCC)    By report there is poor compliance w/treatment, poor tolerance of treatment & poor symptom  control. Symptoms include dyspnea & DOE.  Marland Kitchen Obesity   . PUD (peptic ulcer disease)   . Unspecified menopausal and postmenopausal disorder     Past Surgical History:  Procedure Laterality Date  . CARDIAC DEFIBRILLATOR PLACEMENT  01/17/2014   DR Johney Frame  . IMPLANTABLE CARDIOVERTER DEFIBRILLATOR IMPLANT N/A 01/17/2014   Procedure: IMPLANTABLE CARDIOVERTER DEFIBRILLATOR IMPLANT;  Surgeon: Gardiner Rhyme, MD;  Location: [facility] CATH LAB;  Service: Cardiovascular;  Laterality: N/A;  . LEFT AND RIGHT HEART CATHETERIZATION WITH CORONARY ANGIOGRAM N/A 02/27/2013   Procedure: LEFT AND RIGHT HEART CATHETERIZATION WITH CORONARY ANGIOGRAM;  Surgeon: Rollene Rotunda, MD;  Location: [facility] CATH LAB;  Service: Cardiovascular;  Laterality: N/A;  . none      Current Outpatient Medications  Medication Sig Dispense Refill  . amLODipine (NORVASC) 5 MG tablet Take 1 tablet (5 mg total) by mouth daily. 90 tablet 3  . aspirin 81 MG tablet Take 81 mg by mouth daily.    Marland Kitchen atorvastatin (LIPITOR) 80 MG tablet Take 1 tablet (80 mg total) by mouth daily. 30 tablet 12  . carvedilol (COREG) 25 MG tablet Take 1 tablet (25 mg total) by mouth 2 (two) times daily with a meal. 60 tablet 1  . ENTRESTO 97-103 MG TAKE 1 TABLET BY MOUTH TWICE DAILY 180 tablet 3  . ezetimibe (ZETIA) 10 MG tablet Take 1 tablet (10 mg total) by mouth daily. 90 tablet 3  . FARXIGA 10 MG TABS tablet Take 1 tablet by mouth daily.    . furosemide (  LASIX) 20 MG tablet Take 2 tablets (40 mg total) by mouth daily. 180 tablet 3  . hydrALAZINE (APRESOLINE) 100 MG tablet TAKE 1 TABLET BY MOUTH THREE TIMES DAILY 270 tablet 3  . LINZESS 145 MCG CAPS capsule Take 1 tablet by mouth daily.    . metFORMIN (GLUCOPHAGE) 1000 MG tablet Take 1,000 mg by mouth 2 (two) times daily.    Marland Kitchen omeprazole (PRILOSEC) 20 MG capsule Take 20 mg by mouth 2 (two) times daily.    . Polyvinyl Alcohol-Povidone (REFRESH OP) Place 1 drop into both eyes 2 (two) times daily as needed (for dry eyes).      Marland Kitchen spironolactone (ALDACTONE) 25 MG tablet Take 1 tablet (25 mg total) by mouth daily. 90 tablet 1   No current facility-administered medications for this visit.     Allergies:   Isosorbide nitrate   Social History:  The patient  reports that she has never smoked. She has never used smokeless tobacco. She reports that she does not drink alcohol or use drugs.   Family History:  The patient's  family history includes Cancer in her brother; Diabetes Mellitus II in her mother; Hypertension in her father.   ROS:  Please see the history of present illness.   All other systems are personally reviewed and negative.    Exam:    Vital Signs:  There were no vitals taken for this visit.  Chronically ill appearing, alert and conversant, regular work of breathing,  good skin color Eyes- anicteric, neuro- grossly intact, skin- no apparent rash or lesions or cyanosis, mouth- oral mucosa is pink, poor dentition   Labs/Other Tests and Data Reviewed:    Recent Labs: 12/09/2017: ALT 13; BUN 11; Creatinine, Ser 0.75; Potassium 4.4; Sodium 140   Wt Readings from Last 3 Encounters:  12/09/17 235 lb (106.6 kg)  09/07/17 232 lb 4 oz (105.3 kg)  10/01/16 226 lb 3.2 oz (102.6 kg)     Other studies personally reviewed: Additional studies/ records that were reviewed today include: Dr Ludwig Clarks notes, EP PAs notes  Review of the above records today demonstrates: as above Prior radiographs: CXR 07/24/2018- stable cardiomegaly and vascular congestion   Last device remote is reviewed from [employer] PDF dated 08/28/2018 which reveals normal device function, no arrhythmias    ASSESSMENT & PLAN:    1.  Nonischemic CM/ chronic systolic dysfunction Normal ICD function by remotes uptodate on remotes Will enroll in ICM device clinic QRS <130 msec, not a candidate for CRT  2. HTN Stable No change required today  3. COVID 19 screen The patient denies symptoms of COVID 19 at this time.  The importance of  social distancing was discussed today.  Follow-up:  12 months with EP PA Next remote: 11/2018  Current medicines are reviewed at length with the patient today.   The patient does not have concerns regarding her medicines.  The following changes were made today:  none  Labs/ tests ordered today include:  No orders of the defined types were placed in this encounter.   Patient Risk:  after full review of this patients clinical status, I feel that they are at moderate risk at this time.  Today, I have spent 15 minutes with the patient with telehealth technology discussing CHF .    Randolm Idol, MD  09/11/2018 3:25 PM     [facility] [address] [place] [address] [phone] (office) [phone] (fax)

## 2018-09-26 ENCOUNTER — Other Ambulatory Visit: Payer: Self-pay

## 2018-09-26 ENCOUNTER — Encounter (INDEPENDENT_AMBULATORY_CARE_PROVIDER_SITE_OTHER): Payer: Self-pay | Admitting: Internal Medicine

## 2018-09-26 ENCOUNTER — Ambulatory Visit (INDEPENDENT_AMBULATORY_CARE_PROVIDER_SITE_OTHER): Payer: Medicare HMO | Admitting: Internal Medicine

## 2018-09-26 VITALS — BP 157/77 | HR 63 | Temp 84.0°F | Ht 63.0 in | Wt 239.9 lb

## 2018-09-26 DIAGNOSIS — K649 Unspecified hemorrhoids: Secondary | ICD-10-CM | POA: Insufficient documentation

## 2018-09-26 DIAGNOSIS — K643 Fourth degree hemorrhoids: Secondary | ICD-10-CM | POA: Diagnosis not present

## 2018-09-26 NOTE — Patient Instructions (Signed)
Address [address]  Dr. Franky Macho  [phone]  Your appt is at 10:30am Thursday

## 2018-09-26 NOTE — Progress Notes (Signed)
Subjective:    Patient ID: Anna Beltran, female    DOB: Sep 15, 1950, 68 y.o.   MRN: [id number]  HPI Referred by Sharon Seller for external hemorrhoids. Has tried Preparation H in the past with success. Now using Analpram HC TID for 3 weeks with no improvement. Has imtermittent constipation and uses a stool softener as needed. She tells me she is doing bad. She says this is the worst her hemorrhoid has ever been. It is hard to sit.  Has not tried any other medications.  No other GI complaints.  Has never undergone a colonoscopy in the past.    Hx significant for CAD, CHF, DM, hypertension, high cholesterol. ICD. Review of Systems Past Medical History:  Diagnosis Date  . Automatic implantable cardioverter-defibrillator in situ 01/17/2014   BY DR ALLRED  . Bruit    Bilat carotid duplex 09/05/12 CONCLUSION: Mild, less than or equal to 39%, left internal carotid artery stenosis.  . CHF (congestive heart failure) (HCC)    MPI 09/05/12 NORMAL, showed no scar & LVEF is 20-25%. ECHO 12/11/12 LV is markedly dilated, Overall LV systolic function severely impaired with EF = 25-30%, pseudonormal LV filling pattern (consistant with elevated LA pressure).  . Diabetes mellitus, type 2 (HCC)    TYPE 2  . Essential hypertension, benign   . Hyperlipidemia, mixed   . Hypersomnia, unspecified   . Hypertensive cardiomyopathy (HCC)    By report there is poor compliance w/treatment, poor tolerance of treatment & poor symptom control. Symptoms include dyspnea & DOE.  Marland Kitchen Obesity   . PUD (peptic ulcer disease)   . Unspecified menopausal and postmenopausal disorder     Past Surgical History:  Procedure Laterality Date  . CARDIAC DEFIBRILLATOR PLACEMENT  01/17/2014   DR Johney Frame  . IMPLANTABLE CARDIOVERTER DEFIBRILLATOR IMPLANT N/A 01/17/2014   Procedure: IMPLANTABLE CARDIOVERTER DEFIBRILLATOR IMPLANT;  Surgeon: Gardiner Rhyme, MD;  Location: [facility] CATH LAB;  Service: Cardiovascular;  Laterality: N/A;  . LEFT AND  RIGHT HEART CATHETERIZATION WITH CORONARY ANGIOGRAM N/A 02/27/2013   Procedure: LEFT AND RIGHT HEART CATHETERIZATION WITH CORONARY ANGIOGRAM;  Surgeon: Rollene Rotunda, MD;  Location: [facility] CATH LAB;  Service: Cardiovascular;  Laterality: N/A;  . none      Allergies  Allergen Reactions  . Isosorbide Nitrate Other (See Comments)    Very bad headaches    Current Outpatient Medications on File Prior to Visit  Medication Sig Dispense Refill  . amLODipine (NORVASC) 5 MG tablet Take 5 mg by mouth daily.    Marland Kitchen aspirin 81 MG tablet Take 81 mg by mouth daily.    Marland Kitchen atorvastatin (LIPITOR) 80 MG tablet Take 1 tablet (80 mg total) by mouth daily. 30 tablet 12  . carvedilol (COREG) 25 MG tablet Take 1 tablet (25 mg total) by mouth 2 (two) times daily with a meal. 60 tablet 1  . ENTRESTO 97-103 MG TAKE 1 TABLET BY MOUTH TWICE DAILY 180 tablet 3  . furosemide (LASIX) 20 MG tablet Take 2 tablets (40 mg total) by mouth daily. 180 tablet 3  . hydrALAZINE (APRESOLINE) 100 MG tablet TAKE 1 TABLET BY MOUTH THREE TIMES DAILY 270 tablet 3  . metFORMIN (GLUCOPHAGE) 1000 MG tablet Take 1,000 mg by mouth 2 (two) times daily.    Marland Kitchen omeprazole (PRILOSEC) 20 MG capsule Take 20 mg by mouth 2 (two) times daily.    . Polyvinyl Alcohol-Povidone (REFRESH OP) Place 1 drop into both eyes 2 (two) times daily as needed (for dry eyes).     Marland Kitchen  amLODipine (NORVASC) 5 MG tablet Take 1 tablet (5 mg total) by mouth daily. 90 tablet 3  . ezetimibe (ZETIA) 10 MG tablet Take 1 tablet (10 mg total) by mouth daily. 90 tablet 3   No current facility-administered medications on file prior to visit.         Objective:   Physical Exam Blood pressure (!) 157/77, pulse 63, temperature (!) 84 F (28.9 C), height 5\' 3"  (1.6 m), weight 239 lb 14.4 oz (108.8 kg). Alert and oriented. Skin warm and dry. Oral mucosa is moist.   . Sclera anicteric, conjunctivae is pink. Thyroid not enlarged. No cervical lymphadenopathy. Lungs clear. Heart regular rate  and rhythm.  Abdomen is soft. Bowel sounds are positive. No hepatomegaly. No abdominal masses felt. No tenderness.  No edema to lower extremities.  Large thrombosed hemorroid. Dr. Karilyn Cota also in and examined patient.          Assessment & Plan:  Large thrombosed hemorrhoid.  Referral to Dr. Lovell Sheehan on Thursday at 10:30am. Patient is aware and written instructions given to patient.

## 2018-09-28 ENCOUNTER — Encounter: Payer: Self-pay | Admitting: General Surgery

## 2018-09-28 ENCOUNTER — Other Ambulatory Visit: Payer: Self-pay

## 2018-09-28 ENCOUNTER — Ambulatory Visit (INDEPENDENT_AMBULATORY_CARE_PROVIDER_SITE_OTHER): Payer: Medicare HMO | Admitting: General Surgery

## 2018-09-28 VITALS — BP 156/83 | HR 86 | Temp 98.6°F | Resp 18 | Wt 238.0 lb

## 2018-09-28 DIAGNOSIS — K643 Fourth degree hemorrhoids: Secondary | ICD-10-CM

## 2018-09-28 NOTE — Patient Instructions (Addendum)
See Dr. Maisie Fus of [facility] in [place] at [place] on Monday 5/18. [address], [address] [place], [place]  [address] [phone]

## 2018-09-28 NOTE — Progress Notes (Signed)
Patient was urgently referred to my care by Dr. Karilyn Cota for evaluation of a large hemorrhoid encompassing approximately 50% of the anus.  Patient states is been present for 3 weeks.  No active bleeding is noted.  In reviewing her chart, she has an AICD in place with an ejection fraction of 25 to 50%.  She also has thrombocytopenia.  As her comorbidities make surgical intervention at [facility] outside the realm of their ability to handle, I have referred her to Dr. Romie Levee of [facility] at 3pm on 10/02/18.  Patient understands and agrees.

## 2018-10-02 ENCOUNTER — Ambulatory Visit: Payer: Self-pay | Admitting: General Surgery

## 2018-10-02 ENCOUNTER — Other Ambulatory Visit: Payer: Self-pay | Admitting: Pharmacist Clinician (PhC)/ Clinical Pharmacy Specialist

## 2018-10-02 DIAGNOSIS — I428 Other cardiomyopathies: Secondary | ICD-10-CM

## 2018-10-02 MED ORDER — SACUBITRIL-VALSARTAN 97-103 MG PO TABS: 1.0000 | ORAL_TABLET | Freq: Two times a day (BID) | ORAL | 11 refills | Status: DC

## 2018-10-02 NOTE — Telephone Encounter (Signed)
Received letter from [facility] [employer] that patient had not used card in past 90 days.  If not used regularly, grand could be cancelled.    Spoke with patient, she just used grant card to refill med in the past week, probably just after letter mailed.  She would prefer to get 30 day supply of Entresto 97/103

## 2018-10-02 NOTE — H&P (Signed)
History of Present Illness Romie Levee MD; 10/02/2018 3:18 PM) The patient is a 68 year old female who presents with a complaint of anal problems. 69 year old female multiple medical problems who presents to the office with complaints of 3 weeks of anal pain. She states that she has hemorrhoids that are prolapsed and will not reduce. She has significant coronary artery disease and has an AICD in place. She is not on regular anticoagulation. Ejection fraction is 25-30%. Patient denies any constipation or pain with bowel movements. She denies any rectal bleeding.   Past Surgical History Adela Lank [place], RMA; 10/02/2018 3:15 PM) No pertinent past surgical history  Diagnostic Studies History Adela Lank [place], RMA; 10/02/2018 3:15 PM) Colonoscopy never Mammogram 1-3 years ago Pap Smear 1-5 years ago  Allergies Adela Lank [place], RMA; 10/02/2018 3:15 PM) No Known Drug Allergies [10/02/2018]: Allergies Reconciled  Medication History [employer], RMA; 10/02/2018 3:16 PM) amLODIPine Besylate (5MG  Tablet, Oral) Active. Entresto (97-103MG  Tablet, Oral) Active. hydrALAZINE HCl (100MG  Tablet, Oral) Active. metFORMIN HCl (1000MG  Tablet, Oral) Active. Atorvastatin Calcium (80MG  Tablet, Oral) Active. Aspirin (81MG  Tablet DR, Oral) Active. Omeprazole (20MG  Capsule DR, Oral) Active. Carvedilol (25MG  Tablet, Oral) Active. Furosemide (20MG  Tablet, Oral) Active. Zetia (10MG  Tablet, Oral) Active. Refresh (1% Solution, Ophthalmic) Active. Medications Reconciled  Social History Adela Lank [place], RMA; 10/02/2018 3:15 PM) Caffeine use Coffee. No alcohol use No drug use Tobacco use Never smoker.  Pregnancy / Birth History Adela Lank [place], RMA; 10/02/2018 3:15 PM) Age of menopause 39-50 Gravida 5 Maternal age 19-20 Para 5  Other Problems Adela Lank [place], RMA; 10/02/2018 3:15 PM) Congestive Heart Failure Diabetes Mellitus High blood  pressure     Review of Systems (Jacqueline Haggett RMA; 10/02/2018 3:15 PM) General Not Present- Appetite Loss, Chills, Fatigue, Fever, Night Sweats, Weight Gain and Weight Loss. Skin Not Present- Change in Wart/Mole, Dryness, Hives, Jaundice, New Lesions, Non-Healing Wounds, Rash and Ulcer. HEENT Not Present- Earache, Hearing Loss, Hoarseness, Nose Bleed, Oral Ulcers, Ringing in the Ears, Seasonal Allergies, Sinus Pain, Sore Throat, Visual Disturbances, Wears glasses/contact lenses and Yellow Eyes. Respiratory Not Present- Bloody sputum, Chronic Cough, Difficulty Breathing, Snoring and Wheezing. Breast Not Present- Breast Mass, Breast Pain, Nipple Discharge and Skin Changes. Cardiovascular Not Present- Chest Pain, Difficulty Breathing Lying Down, Leg Cramps, Palpitations, Rapid Heart Rate, Shortness of Breath and Swelling of Extremities. Gastrointestinal Present- Hemorrhoids. Not Present- Abdominal Pain, Bloating, Bloody Stool, Change in Bowel Habits, Chronic diarrhea, Constipation, Difficulty Swallowing, Excessive gas, Gets full quickly at meals, Indigestion, Nausea, Rectal Pain and Vomiting. Female Genitourinary Not Present- Frequency, Nocturia, Painful Urination, Pelvic Pain and Urgency. Musculoskeletal Not Present- Back Pain, Joint Pain, Joint Stiffness, Muscle Pain, Muscle Weakness and Swelling of Extremities. Neurological Not Present- Decreased Memory, Fainting, Headaches, Numbness, Seizures, Tingling, Tremor, Trouble walking and Weakness. Psychiatric Not Present- Anxiety, Bipolar, Change in Sleep Pattern, Depression, Fearful and Frequent crying. Endocrine Not Present- Cold Intolerance, Excessive Hunger, Hair Changes, Heat Intolerance, Hot flashes and New Diabetes. Hematology Not Present- Blood Thinners, Easy Bruising, Excessive bleeding, Gland problems, HIV and Persistent Infections.  Vitals [employer] Haggett RMA; 10/02/2018 3:17 PM) 10/02/2018 3:16 PM Weight: 237.6 lb Height:  64in Body Surface Area: 2.1 m Body Mass Index: 40.78 kg/m  Temp.: 98.60F(Oral)  Pulse: 106 (Regular)  P.OX: 90% (Room air) BP: 148/82 (Sitting, Left Wrist, Standard)      Physical Exam Romie Levee MD; 10/02/2018 3:22 PM)  General Mental Status-Alert. General Appearance-Not in acute distress. Build & Nutrition-Well nourished. Posture-Normal posture. Gait-Normal.  Head and Neck Head-normocephalic, atraumatic with  no lesions or palpable masses. Trachea-midline.  Chest and Lung Exam Chest and lung exam reveals -on auscultation, normal breath sounds, no adventitious sounds and normal vocal resonance.  Cardiovascular Cardiovascular examination reveals -normal heart sounds, regular rate and rhythm with no murmurs and no digital clubbing, cyanosis, edema, increased warmth or tenderness.  Abdomen Inspection Inspection of the abdomen reveals - No Hernias. Palpation/Percussion Palpation and Percussion of the abdomen reveal - Soft, Non Tender, No Rigidity (guarding), No hepatosplenomegaly and No Palpable abdominal masses.  Neurologic Neurologic evaluation reveals -alert and oriented x 3 with no impairment of recent or remote memory, normal attention span and ability to concentrate, normal sensation and normal coordination.  Musculoskeletal Normal Exam - Bilateral-Upper Extremity Strength Normal and Lower Extremity Strength Normal.   Results Romie Levee MD; 10/02/2018 3:25 PM) Procedures  Name Value Date ANOSCOPY, DIAGNOSTIC (24401) [ Hemorrhoids ] Procedure Other: Procedure: Anoscopy....Marland KitchenMarland KitchenSurgeon: Maisie Fus....Marland KitchenMarland KitchenAfter the risks and benefits were explained, verbal consent was obtained for above procedure. A medical assistant chaperone was present thoroughout the entire procedure. ....Marland KitchenMarland KitchenAnesthesia: none....Marland KitchenMarland KitchenDiagnosis: anal lesion....Marland KitchenMarland KitchenFindings: large anterior skin tag, no internal hemorrhoid disease  Performed: 10/02/2018 3:25  PM    Assessment & Plan Romie Levee MD; 10/02/2018 3:24 PM)  ANAL SKIN TAG (K64.4) Impression: 68 year old female who presents to the office for evaluation of an anal mass. She has noticed this for the past 3 weeks. It mainly causes her discomfort when sitting. On exam today she has an enlarged anterior skin tag with signs of chronic inflammation. She has minimal hemorrhoid disease. I have recommended excision of the skin tag after cardiac clearance. Risk and benefits were discussed with the patient and all questions were answered.

## 2018-10-03 ENCOUNTER — Telehealth: Payer: Self-pay

## 2018-10-03 NOTE — Telephone Encounter (Signed)
   Primary Cardiologist: Olga Millers, MD  Chart reviewed as part of pre-operative protocol coverage. Patient was contacted 10/03/2018 in reference to pre-operative risk assessment for pending surgery as outlined below.  Anna Beltran was last seen on 09/11/18 by Dr. Johney Frame.  Since that day, Anna Beltran has done well.  She can complete 4.0 METS. She denies any new or worsening cardiac symptoms.  Therefore, based on ACC/AHA guidelines, the patient would be at acceptable risk for the planned procedure without further cardiovascular testing.   I will route this recommendation to the requesting party via Epic fax function and remove from pre-op pool.  Please call with questions.  Roe Rutherford Macallister [place], PA 10/03/2018, 11:11 AM

## 2018-10-03 NOTE — Telephone Encounter (Signed)
   [facility] Pre-operative Risk Assessment    Request for surgical clearance:  1. What type of surgery is being performed? Excision of anal skin tag  2. When is this surgery scheduled? TBD  3. What type of clearance is required (medical clearance vs. Pharmacy clearance to hold med vs. Both)? Both  4. Are there any medications that need to be held prior to surgery and how long?None noted on cardiac clearance request  5. Practice name and name of physician performing surgery? [facility] - Dr. Marcello Moores  6. What is your office phone number [phone]   7.   What is your office fax number [phone]  8.   Anesthesia type (None, local, MAC, general) ? MAC   Ezekiel Menzer I Shawntrice Salle 10/03/2018, 10:19 AM  _________________________________________________________________   (provider comments below)

## 2018-10-04 ENCOUNTER — Telehealth: Payer: Self-pay | Admitting: Student

## 2018-10-04 NOTE — Telephone Encounter (Signed)
Initial contact made for ICM enrollment. Pt agrees to proceed. Will forward to Randon Goldsmith, RN for scheduling.   Thank you!   Anna Beltran [address]" [place], PA-C 10/04/2018 11:08 AM

## 2018-10-04 NOTE — Telephone Encounter (Signed)
-----   Message from [name], RN sent at 10/03/2018  3:59 PM EDT -----  ----- Message ----- From: Hillis Range, MD Sent: 09/11/2018   3:33 PM EDT To: [name], RN  Please enroll in [facility] device clinic for CHF management

## 2018-10-25 ENCOUNTER — Telehealth: Payer: Self-pay

## 2018-10-25 ENCOUNTER — Ambulatory Visit (INDEPENDENT_AMBULATORY_CARE_PROVIDER_SITE_OTHER): Payer: Medicare HMO

## 2018-10-25 DIAGNOSIS — Z95810 Presence of automatic (implantable) cardiac defibrillator: Secondary | ICD-10-CM

## 2018-10-25 DIAGNOSIS — I5022 Chronic systolic (congestive) heart failure: Secondary | ICD-10-CM

## 2018-10-25 NOTE — Progress Notes (Signed)
Patient returned call and advised she spoke with Jonni Sanger last month about monthly follow up. Explained I will be checking monthly reports and providing her with results.  She did have shortness of breath with leg swelling during the recent decreased thoracic impedance.  She admits to taking extra Furosemide pill when fluid symptoms occur.  Symptoms have resolved and she feels fine now.  She thought she had a scheduled office appointment with Dr Stanford Breed on 7/25 and advised she will need to call the office to have that date scheduled.  She denies any problems at this time.  Encouraged to call if she develops fluid symptoms.  She has ICM direct number.

## 2018-10-25 NOTE — Telephone Encounter (Signed)
Remote ICM transmission received.  Attempted call to patient regarding ICM remote transmission and left message to return call   

## 2018-10-25 NOTE — Progress Notes (Signed)
EPIC Encounter for ICM Monitoring  Patient Name: Anna Beltran is a 68 y.o. female Date: 10/25/2018 Primary Care Physican: Dione Housekeeper, MD Primary Cardiologist: Stanford Breed Electrophysiologist: Allred 10/25/2018 Weight: unknown       1st ICM remote transmission. Attempted call to patient and unable to reach.  Left message to return call. Transmission reviewed.    Optivol thoracic impedance normal but was abnormal suggesting fluid accumulation from 10/07/2018 to 10/18/2018.   Prescribed: Furosemide 20 mg take 2 tablets (40 mg total) daily.  Recommendations: Unable to reach.   Follow-up plan: ICM clinic phone appointment on 11/28/2018.    Copy of ICM check sent to Dr. Rayann Heman.   3 month ICM trend: 10/25/2018    1 Year ICM trend:       Rosalene Billings, RN 10/25/2018 12:30 PM

## 2018-11-21 ENCOUNTER — Other Ambulatory Visit: Payer: Self-pay | Admitting: Cardiology

## 2018-11-21 DIAGNOSIS — I428 Other cardiomyopathies: Secondary | ICD-10-CM

## 2018-11-21 MED ORDER — CARVEDILOL 25 MG PO TABS: 25.0000 mg | ORAL_TABLET | Freq: Two times a day (BID) | ORAL | 1 refills | Status: DC

## 2018-11-21 MED ORDER — ENTRESTO 97-103 MG PO TABS: 1.0000 | ORAL_TABLET | Freq: Two times a day (BID) | ORAL | 1 refills | Status: DC

## 2018-11-21 NOTE — Telephone Encounter (Signed)
Rx(s) sent to pharmacy electronically.  

## 2018-11-21 NOTE — Telephone Encounter (Signed)
°*  STAT* If patient is at the pharmacy, call can be transferred to refill team.   1. Which medications need to be refilled? (please list name of each medication and dose if known) Carvedilol entresto  2. Which pharmacy/location (including street and city if local pharmacy) is medication to be sent to? [facility] on [address]  3. Do they need a 30 day or 90 day supply? Patient would like to get 30

## 2018-11-27 ENCOUNTER — Ambulatory Visit (INDEPENDENT_AMBULATORY_CARE_PROVIDER_SITE_OTHER): Payer: Medicare HMO | Admitting: *Deleted

## 2018-11-27 DIAGNOSIS — I509 Heart failure, unspecified: Secondary | ICD-10-CM | POA: Diagnosis not present

## 2018-11-27 DIAGNOSIS — I420 Dilated cardiomyopathy: Secondary | ICD-10-CM

## 2018-11-27 LAB — CUP PACEART REMOTE DEVICE CHECK
Battery Remaining Longevity: 84 mo
Battery Voltage: 3 V
Brady Statistic RV Percent Paced: 0 %
Date Time Interrogation Session: 20200713073524
HighPow Impedance: 76 Ohm
Implantable Lead Implant Date: 20150903
Implantable Lead Location: 753860
Implantable Lead Model: 6935
Implantable Pulse Generator Implant Date: 20150903
Lead Channel Impedance Value: 304 Ohm
Lead Channel Impedance Value: 399 Ohm
Lead Channel Pacing Threshold Amplitude: 0.875 V
Lead Channel Pacing Threshold Pulse Width: 0.4 ms
Lead Channel Sensing Intrinsic Amplitude: 16 mV
Lead Channel Sensing Intrinsic Amplitude: 16 mV
Lead Channel Setting Pacing Amplitude: 2 V
Lead Channel Setting Pacing Pulse Width: 0.4 ms
Lead Channel Setting Sensing Sensitivity: 0.3 mV

## 2018-11-28 ENCOUNTER — Other Ambulatory Visit (HOSPITAL_COMMUNITY)
Admission: RE | Admit: 2018-11-28 | Discharge: 2018-11-28 | Disposition: A | Discharge status: Home / Self Care | Payer: Medicare HMO | Source: Ambulatory Visit | Attending: General Surgery | Admitting: General Surgery

## 2018-11-28 ENCOUNTER — Ambulatory Visit (INDEPENDENT_AMBULATORY_CARE_PROVIDER_SITE_OTHER): Payer: Medicare HMO

## 2018-11-28 DIAGNOSIS — I5022 Chronic systolic (congestive) heart failure: Secondary | ICD-10-CM

## 2018-11-28 DIAGNOSIS — Z1159 Encounter for screening for other viral diseases: Secondary | ICD-10-CM | POA: Insufficient documentation

## 2018-11-28 DIAGNOSIS — Z95810 Presence of automatic (implantable) cardiac defibrillator: Secondary | ICD-10-CM | POA: Diagnosis not present

## 2018-11-29 LAB — SARS CORONAVIRUS 2 (TAT 6-24 HRS): SARS Coronavirus 2: NEGATIVE

## 2018-11-29 NOTE — Progress Notes (Signed)
Reviewing chart to do pre-op interview via phone for surgery on 12-01-2018.  Noted pt has ICD w/ ef 25-30%.  Per anesthesia guidelines for ambulatory surgery center pt's with ef 40% or less is a candidate for surgery at surgery center.  Called and lvm for brenda, or scheduler for dr Marcello Moores,  Informed her that althought pt has cardiac clearance, noted in epic, she is not a candidate for surgery at ambulatory surgery center per anesthesia guidelines, will need to be done main OR.

## 2018-11-29 NOTE — Progress Notes (Signed)
EPIC Encounter for ICM Monitoring  Patient Name: Anna Beltran is a 68 y.o. female Date: 11/29/2018 Primary Care Physican: Patient, No Pcp Per Primary Cardiologist: [place] Electrophysiologist: Allred 11/29/2018 Weight: does not weight all the time                                                           Spoke with patient. She said her fluid symptoms of ShOB and leg swelling correlate with decreased impedance every few days. She says there are days she is probably getting too much salt in her diet.    Optivol thoracic impedance normal but did have some days within the last month that suggested possible fluid accumulation.   Prescribed: Furosemide 20 mg take 2 tablets (40 mg total) daily.  Labs: 12/09/2017 Creatinine 0.75, BUN 11, Potassium 4.4, Sodium 140, GFR 82-95 A complete set of results can be found in Results Review.  Recommendations: No changes and encouraged to call if experiencing any fluid symptoms.  Advised to limit salt intake.  Follow-up plan: ICM clinic phone appointment on 01/01/2019.    Copy of ICM check sent to Dr. Rayann Heman.    3 month ICM trend: 11/28/2018    1 Year ICM trend:       Rosalene Billings, RN 11/29/2018 12:54 PM

## 2018-11-30 ENCOUNTER — Encounter (HOSPITAL_COMMUNITY): Payer: Self-pay | Admitting: *Deleted

## 2018-11-30 ENCOUNTER — Other Ambulatory Visit: Payer: Self-pay

## 2018-11-30 NOTE — Anesthesia Preprocedure Evaluation (Addendum)
Anesthesia Evaluation  Patient identified by MRN, date of birth, ID band Patient awake    Reviewed: Allergy & Precautions, NPO status , Patient's Chart, lab work & pertinent test results  Airway Mallampati: II  TM Distance: >3 FB Neck ROM: Full    Dental  (+) Dental Advisory Given   Pulmonary neg pulmonary ROS,    Pulmonary exam normal breath sounds clear to auscultation       Cardiovascular hypertension, + CAD and +CHF  Normal cardiovascular exam+ Cardiac Defibrillator  Rhythm:Regular Rate:Normal     Neuro/Psych negative neurological ROS  negative psych ROS   GI/Hepatic Neg liver ROS, PUD,   Endo/Other  diabetes  Renal/GU negative Renal ROS     Musculoskeletal negative musculoskeletal ROS (+)   Abdominal   Peds  Hematology negative hematology ROS (+)   Anesthesia Other Findings   Reproductive/Obstetrics negative OB ROS                           Anesthesia Physical Anesthesia Plan  ASA: IV  Anesthesia Plan: General   Post-op Pain Management:    Induction: Intravenous  PONV Risk Score and Plan: 4 or greater and Ondansetron, Dexamethasone and Treatment may vary due to age or medical condition  Airway Management Planned: LMA  Additional Equipment:   Intra-op Plan:   Post-operative Plan: Extubation in OR  Informed Consent: I have reviewed the patients History and Physical, chart, labs and discussed the procedure including the risks, benefits and alternatives for the proposed anesthesia with the patient or authorized representative who has indicated his/her understanding and acceptance.     Dental advisory given  Plan Discussed with: CRNA  Anesthesia Plan Comments: (See APP note by Willeen Cass, FNP)      Anesthesia Quick Evaluation

## 2018-11-30 NOTE — Progress Notes (Signed)
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 Last office visit 12/09/17. Pt cleared for surgery by Micah Flesher, PA 10/03/18 - EP cardiologist is Hillis Range, MD. Last telehealth visit 09/11/18   LABS: Will be obtained day of surgery    IMAGES:  CXR 07/24/18: Stable cardiomegaly and chronic vascular congestion. No acute cardiopulmonary process.   EKG: Will be obtained day of surgery    CV:  Echo 10/15/16:  - Left ventricle: The cavity size was moderately dilated. Wall thickness was normal. Systolic function was severely reduced. The estimated ejection fraction was in the range of 25% to 30%. Severe diffuse hypokinesis with no identifiable regional   variations. Doppler parameters are consistent with abnormal left ventricular relaxation (grade 1 diastolic dysfunction). - Pulmonary arteries: PA peak pressure: 31 mm Hg (S).   Cardiac cath 02/27/13:  - LM: Normal - LAD:  Large vessel wrapping the apex.  Mild long distal 40% stenosis.   D1 small with ostial 25% stenosis.  D2 moderate sized with ostial 25% stenosis.  - LCx: AV groove normal.  OM1 small and normal.  PL large and normal - RCA:   Large and dominant vessel.  Normal.  PDA moderate sized and mid long 25% stenosis. - Left ventriculography: Left ventricular systolic with global hypokinesis, LVEF is estimated at 30%, there is no significant mitral regurgitation . - Final Conclusions:  Mild coronary plaque.  Moderate nonischemic cardiomyopathy.  Normal  right heart pressures.   B Carotid duplex 02/23/13:  - Mild, less than or equal to 39% L ICA stenosis   Past Medical History:  Diagnosis Date  . Automatic implantable cardioverter-defibrillator in situ 01/17/2014   BY DR ALLRED  . Bruit    Bilat carotid duplex 09/05/12 CONCLUSION: Mild, less than or equal to 39%, left internal carotid artery stenosis.  . CHF (congestive heart failure) (HCC)    MPI 09/05/12 NORMAL, showed no scar & LVEF is 20-25%. ECHO 12/11/12 LV is markedly dilated, Overall LV systolic function severely impaired with EF = 25-30%, pseudonormal LV filling pattern (consistant with elevated LA pressure).  . Diabetes mellitus, type 2 (HCC)    TYPE 2  . Essential hypertension, benign   . Hyperlipidemia, mixed   . Hypersomnia, unspecified   . Hypertensive cardiomyopathy (HCC)    By report there is poor compliance w/treatment, poor tolerance of treatment & poor symptom control. Symptoms include dyspnea & DOE.  Marland Kitchen Obesity   . PUD (peptic ulcer disease)   . Unspecified menopausal and postmenopausal disorder     Past Surgical History:  Procedure Laterality Date  . CARDIAC DEFIBRILLATOR PLACEMENT  01/17/2014   DR Johney Frame  . IMPLANTABLE CARDIOVERTER DEFIBRILLATOR IMPLANT N/A 01/17/2014   Procedure: IMPLANTABLE CARDIOVERTER DEFIBRILLATOR IMPLANT;  Surgeon: Gardiner Rhyme, MD;  Location: [facility] CATH LAB;  Service: Cardiovascular;  Laterality: N/A;  . LEFT AND RIGHT HEART CATHETERIZATION WITH CORONARY ANGIOGRAM N/A 02/27/2013   Procedure: LEFT AND RIGHT HEART CATHETERIZATION  WITH CORONARY ANGIOGRAM;  Surgeon: Minus Breeding, MD;  Location: [facility] CATH LAB;  Service: Cardiovascular;  Laterality: N/A;  . none      MEDICATIONS: No current facility-administered medications for this encounter.    Marland Kitchen amLODipine (NORVASC) 5 MG tablet  . aspirin 81 MG tablet  . carvedilol (COREG) 25 MG tablet  . furosemide (LASIX) 20 MG tablet  . glipiZIDE (GLUCOTROL) 5 MG tablet  . hydrALAZINE (APRESOLINE)  100 MG tablet  . metFORMIN (GLUCOPHAGE) 1000 MG tablet  . omeprazole (PRILOSEC) 20 MG capsule  . Polyvinyl Alcohol-Povidone (REFRESH OP)  . sacubitril-valsartan (ENTRESTO) 97-103 MG    If labs acceptable day of surgery, I anticipate pt can proceed with surgery as scheduled.  Willeen Cass, FNP-BC [facility] Stay [facility]/Anesthesiology Phone: [phone] 11/30/2018 1:44 PM

## 2018-12-01 ENCOUNTER — Ambulatory Visit (HOSPITAL_COMMUNITY): Payer: Medicare HMO | Admitting: Emergency Medicine

## 2018-12-01 ENCOUNTER — Encounter (HOSPITAL_COMMUNITY)
Admission: RE | Disposition: A | Discharge status: Home / Self Care | Payer: Self-pay | Source: Home / Self Care | Attending: General Surgery

## 2018-12-01 ENCOUNTER — Ambulatory Visit (HOSPITAL_COMMUNITY)
Admission: RE | Admit: 2018-12-01 | Discharge: 2018-12-01 | Disposition: A | Discharge status: Home / Self Care | Payer: Medicare HMO | Attending: General Surgery | Admitting: General Surgery

## 2018-12-01 ENCOUNTER — Encounter (HOSPITAL_COMMUNITY): Payer: Self-pay | Admitting: Certified Registered Nurse Anesthetist

## 2018-12-01 DIAGNOSIS — K644 Residual hemorrhoidal skin tags: Secondary | ICD-10-CM | POA: Diagnosis present

## 2018-12-01 DIAGNOSIS — I110 Hypertensive heart disease with heart failure: Secondary | ICD-10-CM | POA: Diagnosis not present

## 2018-12-01 DIAGNOSIS — Z6841 Body Mass Index (BMI) 40.0 and over, adult: Secondary | ICD-10-CM | POA: Insufficient documentation

## 2018-12-01 DIAGNOSIS — Z7984 Long term (current) use of oral hypoglycemic drugs: Secondary | ICD-10-CM | POA: Diagnosis not present

## 2018-12-01 DIAGNOSIS — R9431 Abnormal electrocardiogram [ECG] [EKG]: Secondary | ICD-10-CM | POA: Insufficient documentation

## 2018-12-01 DIAGNOSIS — I2510 Atherosclerotic heart disease of native coronary artery without angina pectoris: Secondary | ICD-10-CM | POA: Diagnosis not present

## 2018-12-01 DIAGNOSIS — L918 Other hypertrophic disorders of the skin: Secondary | ICD-10-CM | POA: Diagnosis not present

## 2018-12-01 DIAGNOSIS — Z7982 Long term (current) use of aspirin: Secondary | ICD-10-CM | POA: Diagnosis not present

## 2018-12-01 DIAGNOSIS — E119 Type 2 diabetes mellitus without complications: Secondary | ICD-10-CM | POA: Insufficient documentation

## 2018-12-01 DIAGNOSIS — Z95810 Presence of automatic (implantable) cardiac defibrillator: Secondary | ICD-10-CM | POA: Diagnosis not present

## 2018-12-01 DIAGNOSIS — E669 Obesity, unspecified: Secondary | ICD-10-CM | POA: Insufficient documentation

## 2018-12-01 DIAGNOSIS — Z8711 Personal history of peptic ulcer disease: Secondary | ICD-10-CM | POA: Diagnosis not present

## 2018-12-01 DIAGNOSIS — Z79899 Other long term (current) drug therapy: Secondary | ICD-10-CM | POA: Diagnosis not present

## 2018-12-01 DIAGNOSIS — I509 Heart failure, unspecified: Secondary | ICD-10-CM | POA: Insufficient documentation

## 2018-12-01 DIAGNOSIS — E782 Mixed hyperlipidemia: Secondary | ICD-10-CM | POA: Diagnosis not present

## 2018-12-01 HISTORY — PX: EXCISION OF SKIN TAG: SHX6270

## 2018-12-01 LAB — CBC
HCT: 40.5 % (ref 36.0–46.0)
Hemoglobin: 13 g/dL (ref 12.0–15.0)
MCH: 30.2 pg (ref 26.0–34.0)
MCHC: 32.1 g/dL (ref 30.0–36.0)
MCV: 94.2 fL (ref 80.0–100.0)
Platelets: 189 10*3/uL (ref 150–400)
RBC: 4.3 MIL/uL (ref 3.87–5.11)
RDW: 15.9 % — ABNORMAL HIGH (ref 11.5–15.5)
WBC: 6.3 10*3/uL (ref 4.0–10.5)
nRBC: 0 % (ref 0.0–0.2)

## 2018-12-01 LAB — BASIC METABOLIC PANEL
Anion gap: 12 (ref 5–15)
BUN: 11 mg/dL (ref 8–23)
CO2: 23 mmol/L (ref 22–32)
Calcium: 9.6 mg/dL (ref 8.9–10.3)
Chloride: 103 mmol/L (ref 98–111)
Creatinine, Ser: 0.78 mg/dL (ref 0.44–1.00)
GFR calc Af Amer: >60 mL/min (ref >60)
GFR calc non Af Amer: >60 mL/min (ref >60)
Glucose, Bld: 199 mg/dL — ABNORMAL HIGH (ref 70–99)
Potassium: 3.7 mmol/L (ref 3.5–5.1)
Sodium: 138 mmol/L (ref 135–145)

## 2018-12-01 LAB — HEMOGLOBIN A1C
Hgb A1c MFr Bld: 8.5 % — ABNORMAL HIGH (ref 4.8–5.6)
Mean Plasma Glucose: 197.25 mg/dL

## 2018-12-01 LAB — GLUCOSE, CAPILLARY
Glucose-Capillary: 203 mg/dL — ABNORMAL HIGH (ref 70–99)
Glucose-Capillary: 189 mg/dL — ABNORMAL HIGH (ref 70–99)

## 2018-12-01 SURGERY — EXCISION, SKIN TAG
Anesthesia: General

## 2018-12-01 MED ORDER — BUPIVACAINE LIPOSOME 1.3 % IJ SUSP
INTRAMUSCULAR | Status: DC | PRN
  Administered 2018-12-01: 20 mL

## 2018-12-01 MED ORDER — OXYCODONE HCL 5 MG PO TABS: 5.0000 mg | ORAL_TABLET | Freq: Once | ORAL | Status: DC | PRN

## 2018-12-01 MED ORDER — BUPIVACAINE-EPINEPHRINE (PF) 0.25% -1:200000 IJ SOLN
INTRAMUSCULAR | Status: AC
  Filled 2018-12-01: qty 30

## 2018-12-01 MED ORDER — GABAPENTIN 300 MG PO CAPS
ORAL_CAPSULE | ORAL | Status: AC
  Administered 2018-12-01: 300 mg via ORAL
  Filled 2018-12-01: qty 1

## 2018-12-01 MED ORDER — PROMETHAZINE HCL 25 MG/ML IJ SOLN: 6.2500 mg | INTRAMUSCULAR | Status: DC | PRN

## 2018-12-01 MED ORDER — SODIUM CHLORIDE (PF) 0.9 % IJ SOLN
INTRAMUSCULAR | Status: AC
  Filled 2018-12-01: qty 20

## 2018-12-01 MED ORDER — PHENYLEPHRINE HCL (PRESSORS) 10 MG/ML IV SOLN
INTRAVENOUS | Status: DC | PRN
  Administered 2018-12-01: 80 ug via INTRAVENOUS

## 2018-12-01 MED ORDER — FENTANYL CITRATE (PF) 100 MCG/2ML IJ SOLN
INTRAMUSCULAR | Status: DC | PRN
  Administered 2018-12-01 (×2): 25 ug via INTRAVENOUS

## 2018-12-01 MED ORDER — HYDROMORPHONE HCL 1 MG/ML IJ SOLN: 0.2500 mg | INTRAMUSCULAR | Status: DC | PRN

## 2018-12-01 MED ORDER — 0.9 % SODIUM CHLORIDE (POUR BTL) OPTIME
TOPICAL | Status: DC | PRN
  Administered 2018-12-01: 1000 mL

## 2018-12-01 MED ORDER — PROPOFOL 10 MG/ML IV BOLUS
INTRAVENOUS | Status: AC
  Filled 2018-12-01: qty 20

## 2018-12-01 MED ORDER — LACTATED RINGERS IV SOLN
INTRAVENOUS | Status: DC
  Administered 2018-12-01: 13:00:00 via INTRAVENOUS

## 2018-12-01 MED ORDER — LIDOCAINE HCL URETHRAL/MUCOSAL 2 % EX GEL
CUTANEOUS | Status: AC
  Filled 2018-12-01: qty 5

## 2018-12-01 MED ORDER — ALBUTEROL SULFATE (2.5 MG/3ML) 0.083% IN NEBU
INHALATION_SOLUTION | RESPIRATORY_TRACT | Status: AC
  Filled 2018-12-01: qty 3

## 2018-12-01 MED ORDER — HYDROGEN PEROXIDE 3 % EX SOLN
CUTANEOUS | Status: AC
  Filled 2018-12-01: qty 473

## 2018-12-01 MED ORDER — SODIUM CHLORIDE 3 % IN NEBU
INHALATION_SOLUTION | RESPIRATORY_TRACT | Status: AC
  Filled 2018-12-01: qty 4

## 2018-12-01 MED ORDER — SODIUM CHLORIDE 0.9% FLUSH: 3.0000 mL | Freq: Two times a day (BID) | INTRAVENOUS | Status: DC

## 2018-12-01 MED ORDER — MEPERIDINE HCL 50 MG/ML IJ SOLN: 6.2500 mg | INTRAMUSCULAR | Status: DC | PRN

## 2018-12-01 MED ORDER — OXYCODONE HCL 5 MG PO TABS: 5.0000 mg | ORAL_TABLET | Freq: Four times a day (QID) | ORAL | 0 refills | Status: DC | PRN

## 2018-12-01 MED ORDER — DEXAMETHASONE SODIUM PHOSPHATE 10 MG/ML IJ SOLN
INTRAMUSCULAR | Status: DC | PRN
  Administered 2018-12-01: 4 mg via INTRAVENOUS

## 2018-12-01 MED ORDER — FENTANYL CITRATE (PF) 100 MCG/2ML IJ SOLN
INTRAMUSCULAR | Status: AC
  Filled 2018-12-01: qty 2

## 2018-12-01 MED ORDER — OXYCODONE HCL 5 MG/5ML PO SOLN: 5.0000 mg | Freq: Once | ORAL | Status: DC | PRN

## 2018-12-01 MED ORDER — LIDOCAINE 2% (20 MG/ML) 5 ML SYRINGE
INTRAMUSCULAR | Status: AC
  Filled 2018-12-01: qty 5

## 2018-12-01 MED ORDER — DEXAMETHASONE SODIUM PHOSPHATE 10 MG/ML IJ SOLN
INTRAMUSCULAR | Status: AC
  Filled 2018-12-01: qty 1

## 2018-12-01 MED ORDER — ACETAMINOPHEN 500 MG PO TABS
ORAL_TABLET | ORAL | Status: AC
  Administered 2018-12-01: 1000 mg via ORAL
  Filled 2018-12-01: qty 2

## 2018-12-01 MED ORDER — ONDANSETRON HCL 4 MG/2ML IJ SOLN
INTRAMUSCULAR | Status: DC | PRN
  Administered 2018-12-01: 4 mg via INTRAVENOUS

## 2018-12-01 MED ORDER — BUPIVACAINE LIPOSOME 1.3 % IJ SUSP
20.0000 mL | Freq: Once | INTRAMUSCULAR | Status: DC
  Filled 2018-12-01: qty 20

## 2018-12-01 MED ORDER — ACETAMINOPHEN 500 MG PO TABS
1000.0000 mg | ORAL_TABLET | ORAL | Status: AC
  Administered 2018-12-01: 1000 mg via ORAL

## 2018-12-01 MED ORDER — EPHEDRINE SULFATE 50 MG/ML IJ SOLN: INTRAMUSCULAR | Status: DC | PRN

## 2018-12-01 MED ORDER — GABAPENTIN 300 MG PO CAPS
300.0000 mg | ORAL_CAPSULE | ORAL | Status: AC
  Administered 2018-12-01: 300 mg via ORAL

## 2018-12-01 MED ORDER — PROPOFOL 10 MG/ML IV BOLUS
INTRAVENOUS | Status: DC | PRN
  Administered 2018-12-01: 140 mg via INTRAVENOUS

## 2018-12-01 MED ORDER — ONDANSETRON HCL 4 MG/2ML IJ SOLN
INTRAMUSCULAR | Status: AC
  Filled 2018-12-01: qty 2

## 2018-12-01 MED ORDER — BUPIVACAINE-EPINEPHRINE 0.25% -1:200000 IJ SOLN
INTRAMUSCULAR | Status: DC | PRN
  Administered 2018-12-01: 30 mL

## 2018-12-01 MED ORDER — ALBUTEROL SULFATE (2.5 MG/3ML) 0.083% IN NEBU
2.5000 mg | INHALATION_SOLUTION | Freq: Once | RESPIRATORY_TRACT | Status: AC
  Administered 2018-12-01: 2.5 mg via RESPIRATORY_TRACT

## 2018-12-01 SURGICAL SUPPLY — 35 items
BLADE SURG 15 STRL LF DISP TIS (BLADE) ×1 IMPLANT
BLADE SURG 15 STRL SS (BLADE) ×2
BNDG GAUZE ELAST 4 BULKY (GAUZE/BANDAGES/DRESSINGS) ×1 IMPLANT
BRIEF STRETCH FOR OB PAD LRG (UNDERPADS AND DIAPERS) ×2 IMPLANT
COVER WAND RF STERILE (DRAPES) IMPLANT
DECANTER SPIKE VIAL GLASS SM (MISCELLANEOUS) ×2 IMPLANT
DRAPE SHEET LG 3/4 BI-LAMINATE (DRAPES) ×1 IMPLANT
DRAPE UNDERBUTTOCKS STRL (DISPOSABLE) ×1 IMPLANT
DRSG PAD ABDOMINAL 8X10 ST (GAUZE/BANDAGES/DRESSINGS) ×1 IMPLANT
ELECT PENCIL ROCKER SW 15FT (MISCELLANEOUS) ×2 IMPLANT
ELECT REM PT RETURN 15FT ADLT (MISCELLANEOUS) ×2 IMPLANT
GAUZE 4X4 16PLY RFD (DISPOSABLE) ×2 IMPLANT
GAUZE SPONGE 4X4 12PLY STRL (GAUZE/BANDAGES/DRESSINGS) IMPLANT
GLOVE BIO SURGEON STRL SZ 6.5 (GLOVE) ×2 IMPLANT
GLOVE BIOGEL PI IND STRL 7.0 (GLOVE) ×1 IMPLANT
GLOVE BIOGEL PI INDICATOR 7.0 (GLOVE) ×1
GOWN STRL REUS W/TWL 2XL LVL3 (GOWN DISPOSABLE) ×2 IMPLANT
GOWN STRL REUS W/TWL XL LVL3 (GOWN DISPOSABLE) ×2 IMPLANT
IV CATH 14GX2 1/4 (CATHETERS) IMPLANT
KIT BASIN OR (CUSTOM PROCEDURE TRAY) ×2 IMPLANT
KIT TURNOVER KIT A (KITS) IMPLANT
NEEDLE HYPO 22GX1.5 SAFETY (NEEDLE) ×2 IMPLANT
PACK LITHOTOMY IV (CUSTOM PROCEDURE TRAY) ×2 IMPLANT
SOL PREP PROV IODINE SCRUB 4OZ (MISCELLANEOUS) ×2 IMPLANT
SURGILUBE 2OZ TUBE FLIPTOP (MISCELLANEOUS) ×2 IMPLANT
SUT CHROMIC 2 0 SH (SUTURE) ×2 IMPLANT
SUT CHROMIC 3 0 SH 27 (SUTURE) ×1 IMPLANT
SUT MON AB 3-0 SH 27 (SUTURE)
SUT MON AB 3-0 SH27 (SUTURE) IMPLANT
SUT VIC AB 4-0 PS2 18 (SUTURE) IMPLANT
SYR 20CC LL (SYRINGE) ×2 IMPLANT
SYR CONTROL 10ML LL (SYRINGE) ×1 IMPLANT
TOWEL OR 17X26 10 PK STRL BLUE (TOWEL DISPOSABLE) ×2 IMPLANT
TOWEL OR NON WOVEN STRL DISP B (DISPOSABLE) ×2 IMPLANT
YANKAUER SUCT BULB TIP 10FT TU (MISCELLANEOUS) ×2 IMPLANT

## 2018-12-01 NOTE — Transfer of Care (Signed)
Immediate Anesthesia Transfer of Care Note  Patient: Anna Beltran  Procedure(s) Performed: EXCISION OF SKIN TAG ANAL (N/A )  Patient Location: PACU  Anesthesia Type:General  Level of Consciousness: awake, alert  and oriented  Airway & Oxygen Therapy: Patient Spontanous Breathing and Patient connected to face mask oxygen  Post-op Assessment: Report given to RN and Post -op Vital signs reviewed and stable  Post vital signs: Reviewed and stable  Last Vitals:  Vitals Value Taken Time  BP 157/77 12/01/18 1424  Temp    Pulse 78 12/01/18 1428  Resp    SpO2 99 % 12/01/18 1428  Vitals shown include unvalidated device data.  Last Pain:  Vitals:   12/01/18 1306  TempSrc: Oral         Complications: No apparent anesthesia complications

## 2018-12-01 NOTE — Progress Notes (Signed)
Pt ambulated to bathroom, tolerated well, voided QS; back to bed ; reconnected to SAO2 monitor; SAO2 61 , pt feels "a little funny and a little short of breath", up to 93 with deep breathing and encouragement, pt states this sometimes happens at home after exertion and she puts her oxygen on and rests; Dr. Lissa Hoard and Dr. Marcello Moores notified of this; they will discuss what happened but are OK with pt going home and using her oxygen for the next 48 hours

## 2018-12-01 NOTE — H&P (Signed)
The patient is a 68 year old female who presents with a complaint of anal problems. 68 year old female multiple medical problems who presents to the office with complaints of 3 weeks of anal pain. She states that she has hemorrhoids that are prolapsed and will not reduce. She has significant coronary artery disease and has an AICD in place. She is not on regular anticoagulation. Ejection fraction is 25-30%. Patient denies any constipation or pain with bowel movements. She denies any rectal bleeding.   Past Surgical History Adela Lank [place], RMA; 10/02/2018 3:15 PM) No pertinent past surgical history  Diagnostic Studies History Adela Lank [place], RMA; 10/02/2018 3:15 PM) Colonoscopy never Mammogram 1-3 years ago Pap Smear 1-5 years ago  Allergies Adela Lank [place], RMA; 10/02/2018 3:15 PM) No Known Drug Allergies [10/02/2018]: Allergies Reconciled  Medication History [facility], RMA; 10/02/2018 3:16 PM) amLODIPine Besylate (5MG  Tablet, Oral) Active. Entresto (97-103MG  Tablet, Oral) Active. hydrALAZINE HCl (100MG  Tablet, Oral) Active. metFORMIN HCl (1000MG  Tablet, Oral) Active. Atorvastatin Calcium (80MG  Tablet, Oral) Active. Aspirin (81MG  Tablet DR, Oral) Active. Omeprazole (20MG  Capsule DR, Oral) Active. Carvedilol (25MG  Tablet, Oral) Active. Furosemide (20MG  Tablet, Oral) Active. Zetia (10MG  Tablet, Oral) Active. Refresh (1% Solution, Ophthalmic) Active. Medications Reconciled  Social History Adela Lank [place], RMA; 10/02/2018 3:15 PM) Caffeine use Coffee. No alcohol use No drug use Tobacco use Never smoker.  Pregnancy / Birth History Adela Lank [place], RMA; 10/02/2018 3:15 PM) Age of menopause 72-50 Gravida 5 Maternal age 9-20 Para 5  Other Problems Adela Lank [place], RMA; 10/02/2018 3:15 PM) Congestive Heart Failure Diabetes Mellitus High blood pressure     Review of Systems  General Not Present-  Appetite Loss, Chills, Fatigue, Fever, Night Sweats, Weight Gain and Weight Loss. Skin Not Present- Change in Wart/Mole, Dryness, Hives, Jaundice, New Lesions, Non-Healing Wounds, Rash and Ulcer. HEENT Not Present- Earache, Hearing Loss, Hoarseness, Nose Bleed, Oral Ulcers, Ringing in the Ears, Seasonal Allergies, Sinus Pain, Sore Throat, Visual Disturbances, Wears glasses/contact lenses and Yellow Eyes. Respiratory Not Present- Bloody sputum, Chronic Cough, Difficulty Breathing, Snoring and Wheezing. Breast Not Present- Breast Mass, Breast Pain, Nipple Discharge and Skin Changes. Cardiovascular Not Present- Chest Pain, Difficulty Breathing Lying Down, Leg Cramps, Palpitations, Rapid Heart Rate, Shortness of Breath and Swelling of Extremities. Gastrointestinal Present- Hemorrhoids. Not Present- Abdominal Pain, Bloating, Bloody Stool, Change in Bowel Habits, Chronic diarrhea, Constipation, Difficulty Swallowing, Excessive gas, Gets full quickly at meals, Indigestion, Nausea, Rectal Pain and Vomiting. Female Genitourinary Not Present- Frequency, Nocturia, Painful Urination, Pelvic Pain and Urgency. Musculoskeletal Not Present- Back Pain, Joint Pain, Joint Stiffness, Muscle Pain, Muscle Weakness and Swelling of Extremities. Neurological Not Present- Decreased Memory, Fainting, Headaches, Numbness, Seizures, Tingling, Tremor, Trouble walking and Weakness. Psychiatric Not Present- Anxiety, Bipolar, Change in Sleep Pattern, Depression, Fearful and Frequent crying. Endocrine Not Present- Cold Intolerance, Excessive Hunger, Hair Changes, Heat Intolerance, Hot flashes and New Diabetes. Hematology Not Present- Blood Thinners, Easy Bruising, Excessive bleeding, Gland problems, HIV and Persistent Infections.    Physical Exam   General Mental Status-Alert. General Appearance-Not in acute distress. Build & Nutrition-Well nourished. Posture-Normal posture. Gait-Normal.  Head and  Neck Head-normocephalic, atraumatic with no lesions or palpable masses. Trachea-midline.  Chest and Lung Exam Chest and lung exam reveals -on auscultation, normal breath sounds, no adventitious sounds and normal vocal resonance.  Cardiovascular Cardiovascular examination reveals -normal heart sounds, regular rate and rhythm with no murmurs and no digital clubbing, cyanosis, edema, increased warmth or tenderness.  Abdomen Inspection Inspection of the abdomen reveals - No Hernias. Palpation/Percussion Palpation  and Percussion of the abdomen reveal - Soft, Non Tender, No Rigidity (guarding), No hepatosplenomegaly and No Palpable abdominal masses.  Neurologic Neurologic evaluation reveals -alert and oriented x 3 with no impairment of recent or remote memory, normal attention span and ability to concentrate, normal sensation and normal coordination.  Musculoskeletal Normal Exam - Bilateral-Upper Extremity Strength Normal and Lower Extremity Strength Normal.  ANOSCOPY, DIAGNOSTIC (68372) [ Hemorrhoids ] Procedure Other: Procedure: Anoscopy....Marland KitchenMarland KitchenSurgeon: Marcello Moores....Marland KitchenMarland KitchenAfter the risks and benefits were explained, verbal consent was obtained for above procedure. A medical assistant chaperone was present thoroughout the entire procedure. ....Marland KitchenMarland KitchenAnesthesia: none....Marland KitchenMarland KitchenDiagnosis: anal lesion....Marland KitchenMarland KitchenFindings: large anterior skin tag, no internal hemorrhoid disease  Performed: 10/02/2018 3:25 PM    Assessment & Plan   ANAL SKIN TAG (K64.4) Impression: 68 year old female who presents to the office for evaluation of an anal mass. She has noticed this for the past 3 weeks. It mainly causes her discomfort when sitting. On exam today she has an enlarged anterior skin tag with signs of chronic inflammation. She has minimal hemorrhoid disease. I have recommended excision of the skin tag.  Cardiac clearance was obtained. Risk and benefits were discussed with the patient and all  questions were answered.

## 2018-12-01 NOTE — Anesthesia Procedure Notes (Signed)
Procedure Name: LMA Insertion Date/Time: 12/01/2018 1:38 PM Performed by: Eben Burow, CRNA Pre-anesthesia Checklist: Patient identified, Emergency Drugs available, Suction available, Patient being monitored and Timeout performed Patient Re-evaluated:Patient Re-evaluated prior to induction Oxygen Delivery Method: Circle system utilized Preoxygenation: Pre-oxygenation with 100% oxygen Induction Type: IV induction Ventilation: Mask ventilation without difficulty LMA: LMA inserted LMA Size: 4.0 Tube secured with: Tape Dental Injury: Teeth and Oropharynx as per pre-operative assessment

## 2018-12-01 NOTE — Progress Notes (Signed)
Dr. Lissa Hoard in to check pt and tell her to wear her oxygen for the next 48 hours at home continuously and OK for her to be discharged home

## 2018-12-01 NOTE — Discharge Instructions (Addendum)
ANORECTAL SURGERY: POST OP INSTRUCTIONS °1. Take your usually prescribed home medications unless otherwise directed. °2. DIET: During the first few hours after surgery sip on some liquids until you are able to urinate.  It is normal to not urinate for several hours after this surgery.  If you feel uncomfortable, please contact the office for instructions.  After you are able to urinate,you may eat, if you feel like it.  Follow a light bland diet the first 24 hours after arrival home, such as soup, liquids, crackers, etc.  Be sure to include lots of fluids daily (6-8 glasses).  Avoid fast food or heavy meals, as your are more likely to get nauseated.  Eat a low fat diet the next few days after surgery.  Limit caffeine intake to 1-2 servings a day. °3. PAIN CONTROL: °a. Pain is best controlled by a usual combination of several different methods TOGETHER: °i. Muscle relaxation: Soak in a warm bath (or Sitz bath) three times a day and after bowel movements.  Continue to do this until all pain is resolved. °ii. Over the counter pain medication °iii. Prescription pain medication °b. Most patients will experience some swelling and discomfort in the anus/rectal area and incisions.  Heat such as warm towels, sitz baths, warm baths, etc to help relax tight/sore spots and speed recovery.  Some people prefer to use ice, especially in the first couple days after surgery, as it may decrease the pain and swelling, or alternate between ice & heat.  Experiment to what works for you.  Swelling and bruising can take several weeks to resolve.  Pain can take even longer to completely resolve. °c. It is helpful to take an over-the-counter pain medication regularly for the first few weeks.  Choose one of the following that works best for you: °i. Naproxen (Aleve, etc)  Two 220mg tabs twice a day °ii. Ibuprofen (Advil, etc) Three 200mg tabs four times a day (every meal & bedtime) °d. A  prescription for pain medication (such as percocet,  oxycodone, hydrocodone, etc) should be given to you upon discharge.  Take your pain medication as prescribed.  °i. If you are having problems/concerns with the prescription medicine (does not control pain, nausea, vomiting, rash, itching, etc), please call us [phone] to see if we need to switch you to a different pain medicine that will work better for you and/or control your side effect better. °ii. If you need a refill on your pain medication, please contact your pharmacy.  They will contact our office to request authorization. Prescriptions will not be filled after 5 pm or on week-ends. °4. KEEP YOUR BOWELS REGULAR and AVOID CONSTIPATION °a. The goal is one to two soft bowel movements a day.  You should at least have a bowel movement every other day. °b. Avoid getting constipated.  Between the surgery and the pain medications, it is common to experience some constipation. This can be very painful after rectal surgery.  Increasing fluid intake and taking a fiber supplement (such as Metamucil, Citrucel, FiberCon, etc) 1-2 times a day regularly will usually help prevent this problem from occurring.  A stool softener like colace is also recommended.  This can be purchased over the counter at your pharmacy.  You can take it up to 3 times a day.  If you do not have a bowel movement after 24 hrs since your surgery, take one does of milk of magnesia.  If you still haven't had a bowel movement 8-12 hours after   that dose, take another dose.  If you don't have a bowel movement 48 hrs after surgery, purchase a Fleets enema from the drug store and administer gently per package instructions.  If you still are having trouble with your bowel movements after that, please call the office for further instructions. °c. If you develop diarrhea or have many loose bowel movements, simplify your diet to bland foods & liquids for a few days.  Stop any stool softeners and decrease your fiber supplement.  Switching to mild  anti-diarrheal medications (Kayopectate, Pepto Bismol) can help.  If this worsens or does not improve, please call us. ° °5. Wound Care °a. Remove your bandages before your first bowel movement or 8 hours after surgery.     °b. Remove any wound packing material at this tim,e as well.  You do not need to repack the wound unless instructed otherwise.  Wear an absorbent pad or soft cotton gauze in your underwear to catch any drainage and help keep the area clean. You should change this every 2-3 hours while awake. °c. Keep the area clean and dry.  Bathe / shower every day, especially after bowel movements.  Keep the area clean by showering / bathing over the incision / wound.   It is okay to soak an open wound to help wash it.  Wet wipes or showers / gentle washing after bowel movements is often less traumatic than regular toilet paper. °d. You may have some styrofoam-like soft packing in the rectum which will come out with the first bowel movement.  °e. You will often notice bleeding with bowel movements.  This should slow down by the end of the first week of surgery °f. Expect some drainage.  This should slow down, too, by the end of the first week of surgery.  Wear an absorbent pad or soft cotton gauze in your underwear until the drainage stops. °g. Do Not sit on a rubber or pillow ring.  This can make you symptoms worse.  You may sit on a soft pillow if needed.  °6. ACTIVITIES as tolerated:   °a. You may resume regular (light) daily activities beginning the next day--such as daily self-care, walking, climbing stairs--gradually increasing activities as tolerated.  If you can walk 30 minutes without difficulty, it is safe to try more intense activity such as jogging, treadmill, bicycling, low-impact aerobics, swimming, etc. °b. Save the most intensive and strenuous activity for last such as sit-ups, heavy lifting, contact sports, etc  Refrain from any heavy lifting or straining until you are off narcotics for pain  control.   °c. You may drive when you are no longer taking prescription pain medication, you can comfortably sit for long periods of time, and you can safely maneuver your car and apply brakes. °d. You may have sexual intercourse when it is comfortable.  °7. FOLLOW UP in our office °a. Please call [facility] at [phone] to set up an appointment to see your surgeon in the office for a follow-up appointment approximately 3-4 weeks after your surgery. °b. Make sure that you call for this appointment the day you arrive home to insure a convenient appointment time. °10. IF YOU HAVE DISABILITY OR FAMILY LEAVE FORMS, BRING THEM TO THE OFFICE FOR PROCESSING.  DO NOT GIVE THEM TO YOUR DOCTOR. ° ° ° ° °WHEN TO CALL US [phone]: °1. Poor pain control °2. Reactions / problems with new medications (rash/itching, nausea, etc)  °3. Fever over 101.5 F (38.5 C) °4.   Inability to urinate 5. Nausea and/or vomiting 6. Worsening swelling or bruising 7. Continued bleeding from incision. 8. Increased pain, redness, or drainage from the incision  The clinic staff is available to answer your questions during regular business hours (8:30am-5pm).  Please dont hesitate to call and ask to speak to one of our nurses for clinical concerns.   A surgeon from [facility] is always on call at the hospitals   If you have a medical emergency, go to the nearest emergency room or call 911.    [facility], [address], [place], [place], [place]  [address] ? MAIN: [phone] ? TOLL FREE: [phone] ? FAX [phone] [email or web address]  Instruct patient to wear her oxygen for the next 48 hours continuously at home.

## 2018-12-01 NOTE — Progress Notes (Signed)
Pt's SAO2 preop on room air 90%; have been transitioning pt to nasal cannula at 2 Liters and also using Incentive spirometer; when nasal O2 is off, SAO2 down to 85-89 % with deep breathing; pt becoming more drowsy; will allow pt to rest and attempt decreasing O2 when pt is more alert

## 2018-12-01 NOTE — Op Note (Signed)
12/01/2018  2:09 PM  PATIENT:  Anna Beltran  68 y.o. female  Patient Care Team: Patient, No Pcp Per as PCP - General ([place]) Thompson Grayer, MD as PCP - Electrophysiology (Cardiology) Stanford Breed Denice Bors, MD as PCP - Cardiology (Cardiology)  PRE-OPERATIVE DIAGNOSIS:  ANAL SKIN TAG  POST-OPERATIVE DIAGNOSIS:  ANAL SKIN TAG  PROCEDURE:  EXCISION OF SKIN TAG ANAL   Surgeon(s): Leighton Ruff, MD  ASSISTANT: none   ANESTHESIA:   local and MAC  SPECIMEN:  Source of Specimen:  anal skin tag  DISPOSITION OF SPECIMEN:  PATHOLOGY  COUNTS:  YES  PLAN OF CARE: Discharge to home after PACU  PATIENT DISPOSITION:  PACU - hemodynamically stable.  INDICATION: 68 year old female who presented to the office with an enlarged anal skin tag with chronic inflammation.   OR FINDINGS: Large anterior anal skin tag  DESCRIPTION: the patient was identified in the preoperative holding area and taken to the OR where they were laid on the operating room table.  MAC anesthesia was induced without difficulty. The patient was then positioned in prone jackknife position with buttocks gently taped apart.  The patient was then prepped and draped in usual sterile fashion.  SCDs were noted to be in place prior to the initiation of anesthesia. A surgical timeout was performed indicating the correct patient, procedure, positioning and need for preoperative antibiotics.  A rectal block was performed using Marcaine with epinephrine mixed with Exparel.    I began with a digital rectal exam.  There were no masses noted.  I then placed a Hill-Ferguson anoscope into the anal canal and evaluated this completely.  The patient had minimal internal hemorrhoid disease.  With anoscope in place, I began by elevating the hemorrhoidal tissue off of the sphincter complex using an Allis clamp.  Metzenbaum scissors were used to incise the skin tag in a horizontal fashion.  Careful attention was taken to separate the  sphincter complex from the hemorrhoidal tissue to avoid injury.  Once the skin tag was removed the anoderm was reapproximated using a running 2-0 chromic suture in interrupted 3-0 chromic sutures.  At the end of this hemostasis was good.  A dressing was applied.  The patient was then awakened from anesthesia and sent to the postanesthesia care unit in stable condition.  All counts were correct per operating room staff.

## 2018-12-03 NOTE — Anesthesia Postprocedure Evaluation (Addendum)
Anesthesia Post Note  Patient: Anna Beltran  Procedure(s) Performed: EXCISION OF SKIN TAG ANAL (N/A )     Patient location during evaluation: PACU Anesthesia Type: General Level of consciousness: sedated and patient cooperative Pain management: pain level controlled Vital Signs Assessment: post-procedure vital signs reviewed and stable Respiratory status: spontaneous breathing Cardiovascular status: stable Anesthetic complications: no Comments: Pt on PRN O2 at home. Desating with exertion in PACU. Discussed with Dr. Marcello Moores and patient that she likely needs to be on full time O2 at home.     Last Vitals:  Vitals:   12/01/18 1647 12/01/18 1700  BP: 128/69   Pulse: 80   Resp:    Temp:  36.7 C  SpO2: 92%     Last Pain:  Vitals:   12/01/18 1700  TempSrc:   PainSc: 0-No pain                 Nolon Nations

## 2018-12-04 ENCOUNTER — Encounter (HOSPITAL_COMMUNITY): Payer: Self-pay | Admitting: General Surgery

## 2018-12-08 NOTE — Progress Notes (Signed)
Remote ICD transmission.   

## 2019-01-01 ENCOUNTER — Ambulatory Visit (INDEPENDENT_AMBULATORY_CARE_PROVIDER_SITE_OTHER): Payer: Medicare HMO

## 2019-01-01 DIAGNOSIS — I5022 Chronic systolic (congestive) heart failure: Secondary | ICD-10-CM

## 2019-01-01 DIAGNOSIS — Z95810 Presence of automatic (implantable) cardiac defibrillator: Secondary | ICD-10-CM

## 2019-01-03 NOTE — Progress Notes (Signed)
EPIC Encounter for ICM Monitoring  Patient Name: Anna Beltran is a 68 y.o. female Date: 01/03/2019 Primary Care Physican: Patient, No Pcp Per Primary Cardiologist:[place] Electrophysiologist:Allred 7/15/2020Weight: does not weight all the time   Spoke with patient and she is asymptomatic.   Optivol thoracic impedance normal but did suggest possible fluid accumulation some of the days within the last month.   Prescribed: Furosemide20 mg take 2 tablets (40 mg total) daily.  Labs: 12/09/2017 Creatinine 0.75, BUN 11, Potassium 4.4, Sodium 140, GFR 82-95 A complete set of results can be found in Results Review.  Recommendations:No changes and encouraged to call if experiencing any fluid symptoms.   Follow-up plan: ICM clinic phone appointment on10/14/2020.   Copy of ICM check sent to Dr.Allred.    3 month ICM trend: 01/01/2019    1 Year ICM trend:       Rosalene Billings, RN 01/03/2019 3:27 PM

## 2019-02-06 NOTE — Progress Notes (Signed)
HPI: FU congestive heart failure/cardiomyopathy. Previously followed in [facility]. Carotid Dopplers in April 2014 showed less than 39% left carotid stenosis. Chest CT in March of 2014 showed no pulmonary embolus, small to moderate right effusion which was felt to be partially loculated. Cardiac catheterization in October of 2014 showed nonobstructive coronary disease and an ejection fraction of 30%. Right heart pressures were normal. Apparently patient has had poor compliance with treatment and poor tolerance of treatment based on outside records. ICD placed 9/15.  Last echocardiogram June 2018 showed ejection fraction 25 to 30%, grade 1 diastolic dysfunction and moderate left ventricular enlargement.  Since she was last seen,the patient has dyspnea with more extreme activities but not with routine activities. It is relieved with rest. It is not associated with chest pain. There is no orthopnea, PND or pedal edema. There is no syncope or palpitations. There is no exertional chest pain.   Current Outpatient Medications  Medication Sig Dispense Refill  . amLODipine (NORVASC) 5 MG tablet Take 5 mg by mouth daily.    Marland Kitchen aspirin 81 MG tablet Take 81 mg by mouth daily.    . carvedilol (COREG) 25 MG tablet Take 1 tablet (25 mg total) by mouth 2 (two) times daily with a meal. 60 tablet 1  . furosemide (LASIX) 20 MG tablet Take 2 tablets (40 mg total) by mouth daily. (Patient taking differently: Take 20 mg by mouth 3 times/day as needed-between meals & bedtime for fluid. ) 180 tablet 3  . glipiZIDE (GLUCOTROL) 5 MG tablet Take 5 mg by mouth 2 (two) times daily before a meal.     . hydrALAZINE (APRESOLINE) 100 MG tablet TAKE 1 TABLET BY MOUTH THREE TIMES DAILY 270 tablet 3  . metFORMIN (GLUCOPHAGE) 1000 MG tablet Take 1,000 mg by mouth 2 (two) times daily with a meal.     . omeprazole (PRILOSEC) 20 MG capsule Take 20 mg by mouth 2 (two) times daily.    . Polyvinyl Alcohol-Povidone (REFRESH OP) Place 1  drop into both eyes 2 (two) times daily as needed (for dry eyes).     . sacubitril-valsartan (ENTRESTO) 97-103 MG Take 1 tablet by mouth 2 (two) times daily. 60 tablet 1   No current facility-administered medications for this visit.      Past Medical History:  Diagnosis Date  . Automatic implantable cardioverter-defibrillator in situ 01/17/2014   BY DR ALLRED  . Bruit    Bilat carotid duplex 09/05/12 CONCLUSION: Mild, less than or equal to 39%, left internal carotid artery stenosis.  . CHF (congestive heart failure) (HCC)    MPI 09/05/12 NORMAL, showed no scar & LVEF is 20-25%. ECHO 12/11/12 LV is markedly dilated, Overall LV systolic function severely impaired with EF = 25-30%, pseudonormal LV filling pattern (consistant with elevated LA pressure).  . Diabetes mellitus, type 2 (HCC)    TYPE 2  . Essential hypertension, benign   . Hyperlipidemia, mixed   . Hypersomnia, unspecified   . Hypertensive cardiomyopathy (HCC)    By report there is poor compliance w/treatment, poor tolerance of treatment & poor symptom control. Symptoms include dyspnea & DOE.  Marland Kitchen Obesity   . PUD (peptic ulcer disease)   . Unspecified menopausal and postmenopausal disorder     Past Surgical History:  Procedure Laterality Date  . CARDIAC DEFIBRILLATOR PLACEMENT  01/17/2014   DR Johney Frame  . EXCISION OF SKIN TAG N/A 12/01/2018   Procedure: EXCISION OF SKIN TAG ANAL;  Surgeon: Romie Levee,  MD;  Location: [facility];  Service: General;  Laterality: N/A;  . IMPLANTABLE CARDIOVERTER DEFIBRILLATOR IMPLANT N/A 01/17/2014   Procedure: IMPLANTABLE CARDIOVERTER DEFIBRILLATOR IMPLANT;  Surgeon: Coralyn Mark, MD;  Location: [facility] CATH LAB;  Service: Cardiovascular;  Laterality: N/A;  . LEFT AND RIGHT HEART CATHETERIZATION WITH CORONARY ANGIOGRAM N/A 02/27/2013   Procedure: LEFT AND RIGHT HEART CATHETERIZATION WITH CORONARY ANGIOGRAM;  Surgeon: Minus Breeding, MD;  Location: [facility] CATH LAB;  Service: Cardiovascular;  Laterality: N/A;   . none      Social History   Socioeconomic History  . Marital status: Married    Spouse name: Not on file  . Number of children: 5  . Years of education: Not on file  . Highest education level: Not on file  Occupational History    Comment: Retired  Scientific laboratory technician  . Financial resource strain: Not on file  . Food insecurity    Worry: Not on file    Inability: Not on file  . Transportation needs    Medical: Not on file    Non-medical: Not on file  Tobacco Use  . Smoking status: Never Smoker  . Smokeless tobacco: Never Used  Substance and Sexual Activity  . Alcohol use: No  . Drug use: No  . Sexual activity: Never  Lifestyle  . Physical activity    Days per week: Not on file    Minutes per session: Not on file  . Stress: Not on file  Relationships  . Social Herbalist on phone: Not on file    Gets together: Not on file    Attends religious service: Not on file    Active member of club or organization: Not on file    Attends meetings of clubs or organizations: Not on file    Relationship status: Not on file  . Intimate partner violence    Fear of current or ex partner: Not on file    Emotionally abused: Not on file    Physically abused: Not on file    Forced sexual activity: Not on file  Other Topics Concern  . Not on file  Social History Narrative   Lives in [place] with husband and son.  Retired from [employer] fund[employer]    Family History  Problem Relation Age of Onset  . Diabetes Mellitus II Mother   . Cancer Brother        Colon  . Hypertension Father     ROS: no fevers or chills, productive cough, hemoptysis, dysphasia, odynophagia, melena, hematochezia, dysuria, hematuria, rash, seizure activity, orthopnea, PND, pedal edema, claudication. Remaining systems are negative.  Physical Exam: Well-developed well-nourished in no acute distress.  Skin is warm and dry.  HEENT is normal.  Neck is supple.  Chest is clear to auscultation with normal expansion.   Cardiovascular exam is regular rate and rhythm.  Abdominal exam nontender or distended. No masses palpated. Extremities show no edema. neuro grossly intact  A/P  1 nonischemic cardiomyopathy-plan to continue medical therapy with Entresto and beta-blocker.  Repeat echocardiogram.  2 hypertension-patient's blood pressure is controlled.  Continue present medications and follow.  3 chronic systolic congestive heart failure-she appears to be euvolemic today.  Continue present dose of diuretic.    4 hyperlipidemia-continue statin.  Check lipids and liver.  5 coronary artery disease-she is not having chest pain.  Continue medical therapy with aspirin and statin.  6 prior ICD-followed by Dr. Rayann Heman.  7 morbid obesity-we discussed the importance of diet, exercise  and weight loss.  Olga Millers, MD

## 2019-02-08 ENCOUNTER — Other Ambulatory Visit: Payer: Self-pay

## 2019-02-08 ENCOUNTER — Encounter: Payer: Self-pay | Admitting: Cardiology

## 2019-02-08 ENCOUNTER — Ambulatory Visit: Payer: Medicare HMO | Admitting: Cardiology

## 2019-02-08 VITALS — BP 140/72 | HR 81 | Temp 97.5°F | Ht 64.0 in | Wt 236.0 lb

## 2019-02-08 DIAGNOSIS — Z23 Encounter for immunization: Secondary | ICD-10-CM | POA: Diagnosis not present

## 2019-02-08 DIAGNOSIS — E7800 Pure hypercholesterolemia, unspecified: Secondary | ICD-10-CM | POA: Diagnosis not present

## 2019-02-08 DIAGNOSIS — I10 Essential (primary) hypertension: Secondary | ICD-10-CM | POA: Diagnosis not present

## 2019-02-08 DIAGNOSIS — I5022 Chronic systolic (congestive) heart failure: Secondary | ICD-10-CM | POA: Diagnosis not present

## 2019-02-08 DIAGNOSIS — I428 Other cardiomyopathies: Secondary | ICD-10-CM | POA: Diagnosis not present

## 2019-02-08 NOTE — Patient Instructions (Signed)
Medication Instructions:  NO CHANGE If you need a refill on your cardiac medications before your next appointment, please call your pharmacy.   Lab work: If you have labs (blood work) drawn today and your tests are completely normal, you will receive your results only by: . MyChart Message (if you have MyChart) OR . A paper copy in the mail If you have any lab test that is abnormal or we need to change your treatment, we will call you to review the results.  Testing/Procedures: Your physician has requested that you have an echocardiogram. Echocardiography is a painless test that uses sound waves to create images of your heart. It provides your doctor with information about the size and shape of your heart and how well your heart's chambers and valves are working. This procedure takes approximately one hour. There are no restrictions for this procedure.[address]    Follow-Up: At [facility], you and your health needs are our priority.  As part of our continuing mission to provide you with exceptional heart care, we have created designated Provider Care Teams.  These Care Teams include your primary Cardiologist (physician) and Advanced Practice Providers (APPs -  Physician Assistants and Nurse Practitioners) who all work together to provide you with the care you need, when you need it. You will need a follow up appointment in 6 months.  Please call our office 2 months in advance to schedule this appointment.  You may see Brian Crenshaw, MD or one of the following Advanced Practice Providers on your designated Care Team:   Luke Kilroy, PA-C Krista Kroeger, PA-C . Callie Goodrich, PA-C     

## 2019-02-13 ENCOUNTER — Other Ambulatory Visit: Payer: Self-pay | Admitting: Cardiology

## 2019-02-14 ENCOUNTER — Other Ambulatory Visit: Payer: Self-pay

## 2019-02-14 MED ORDER — AMLODIPINE BESYLATE 5 MG PO TABS: 5.0000 mg | ORAL_TABLET | Freq: Every day | ORAL | 3 refills | Status: DC

## 2019-02-15 ENCOUNTER — Ambulatory Visit (HOSPITAL_COMMUNITY): Payer: Medicare HMO | Attending: Cardiology

## 2019-02-15 ENCOUNTER — Other Ambulatory Visit: Payer: Self-pay

## 2019-02-15 DIAGNOSIS — I428 Other cardiomyopathies: Secondary | ICD-10-CM

## 2019-02-27 ENCOUNTER — Ambulatory Visit (INDEPENDENT_AMBULATORY_CARE_PROVIDER_SITE_OTHER): Payer: Medicare HMO | Admitting: *Deleted

## 2019-02-27 DIAGNOSIS — I5022 Chronic systolic (congestive) heart failure: Secondary | ICD-10-CM | POA: Diagnosis not present

## 2019-02-27 DIAGNOSIS — I420 Dilated cardiomyopathy: Secondary | ICD-10-CM

## 2019-02-27 LAB — CUP PACEART REMOTE DEVICE CHECK
Battery Remaining Longevity: 76 mo
Battery Voltage: 2.99 V
Brady Statistic RV Percent Paced: 0.01 %
Date Time Interrogation Session: 20201013113726
HighPow Impedance: 67 Ohm
Implantable Lead Implant Date: 20150903
Implantable Lead Location: 753860
Implantable Lead Model: 6935
Implantable Pulse Generator Implant Date: 20150903
Lead Channel Impedance Value: 304 Ohm
Lead Channel Impedance Value: 399 Ohm
Lead Channel Pacing Threshold Amplitude: 1.125 V
Lead Channel Pacing Threshold Pulse Width: 0.4 ms
Lead Channel Sensing Intrinsic Amplitude: 16.25 mV
Lead Channel Sensing Intrinsic Amplitude: 16.25 mV
Lead Channel Setting Pacing Amplitude: 2.25 V
Lead Channel Setting Pacing Pulse Width: 0.4 ms
Lead Channel Setting Sensing Sensitivity: 0.3 mV

## 2019-02-28 ENCOUNTER — Ambulatory Visit (INDEPENDENT_AMBULATORY_CARE_PROVIDER_SITE_OTHER): Payer: Medicare HMO

## 2019-02-28 DIAGNOSIS — Z95810 Presence of automatic (implantable) cardiac defibrillator: Secondary | ICD-10-CM | POA: Diagnosis not present

## 2019-02-28 DIAGNOSIS — I5022 Chronic systolic (congestive) heart failure: Secondary | ICD-10-CM | POA: Diagnosis not present

## 2019-03-02 NOTE — Progress Notes (Signed)
EPIC Encounter for ICM Monitoring  Patient Name: Anna Beltran is a 68 y.o. female Date: 03/02/2019 Primary Care Physican: Patient, No Pcp Per Primary Cardiologist:Crenshaw Electrophysiologist:Allred 10/16/2020Weight:236 lbs   Spoke with patient and she is asymptomatic.She is feeling great.  Optivol thoracic impedance normal.  Prescribed: Furosemide20 mg take 2 tablets (40 mg total) daily.  Labs: 12/01/2018 Creatinine 0.78, BUN 11, Potassium 3.7, Sodium 138, GFR >60 A complete set of results can be found in Results Review.  Recommendations: No changes and encouraged to call if experiencing any fluid symptoms..  Follow-up plan: ICM clinic phone appointment on 04/04/2019.   91 day device clinic remote transmission 05/29/2019.    Copy of ICM check sent to Dr. Rayann Heman.   3 month ICM trend: 02/27/2019    1 Year ICM trend:       Rosalene Billings, RN 03/02/2019 4:08 PM

## 2019-03-09 NOTE — Progress Notes (Signed)
Remote ICD transmission.   

## 2019-03-14 ENCOUNTER — Other Ambulatory Visit: Payer: Self-pay | Admitting: Cardiology

## 2019-04-04 ENCOUNTER — Ambulatory Visit (INDEPENDENT_AMBULATORY_CARE_PROVIDER_SITE_OTHER): Payer: Medicare HMO

## 2019-04-04 DIAGNOSIS — Z95810 Presence of automatic (implantable) cardiac defibrillator: Secondary | ICD-10-CM | POA: Diagnosis not present

## 2019-04-04 DIAGNOSIS — I5022 Chronic systolic (congestive) heart failure: Secondary | ICD-10-CM | POA: Diagnosis not present

## 2019-04-09 ENCOUNTER — Telehealth: Payer: Self-pay

## 2019-04-09 NOTE — Progress Notes (Signed)
EPIC Encounter for ICM Monitoring  Patient Name: Anna Beltran is a 68 y.o. female Date: 04/09/2019 Primary Care Physican: Patient, No Pcp Per Primary Cardiologist:Crenshaw Electrophysiologist:Allred 10/16/2020Weight:236 lbs   Attempted call to patient and unable to reach.  Transmission reviewed.   Optivol thoracic impedance normal.  Prescribed: Furosemide20 mg take 2 tablets (40 mg total) daily.  Labs: 12/01/2018 Creatinine 0.78, BUN 11, Potassium 3.7, Sodium 138, GFR >60 A complete set of results can be found in Results Review.  Recommendations: Unable to reach.    Follow-up plan: ICM clinic phone appointment on 05/14/2019.   91 day device clinic remote transmission 05/29/2019.     Copy of ICM check sent to Dr. Rayann Heman.   3 month ICM trend: 04/04/2019    1 Year ICM trend:       Rosalene Billings, RN 04/09/2019 9:01 AM

## 2019-04-09 NOTE — Telephone Encounter (Signed)
Remote ICM transmission received.  Attempted call to patient regarding ICM remote transmission and no answer.  

## 2019-05-14 ENCOUNTER — Ambulatory Visit (INDEPENDENT_AMBULATORY_CARE_PROVIDER_SITE_OTHER): Payer: Medicare HMO

## 2019-05-14 DIAGNOSIS — I5022 Chronic systolic (congestive) heart failure: Secondary | ICD-10-CM | POA: Diagnosis not present

## 2019-05-14 DIAGNOSIS — Z95810 Presence of automatic (implantable) cardiac defibrillator: Secondary | ICD-10-CM | POA: Diagnosis not present

## 2019-05-15 ENCOUNTER — Telehealth: Payer: Self-pay

## 2019-05-15 NOTE — Telephone Encounter (Signed)
Remote ICM transmission received.  Attempted call to patient regarding ICM remote transmission and line busy.  

## 2019-05-15 NOTE — Progress Notes (Signed)
EPIC Encounter for ICM Monitoring  Patient Name: Anna Beltran is a 68 y.o. female Date: 05/15/2019 Primary Care Physican: Patient, No Pcp Per Primary Cardiologist:Crenshaw Electrophysiologist:Allred 10/16/2020Weight:236 lbs   Attempted call to patient and unable to reach.  Transmission reviewed.   Optivol thoracic impedance normal.  Prescribed: Furosemide20 mg take 2 tablets (40 mg total) daily.  Labs: 12/01/2018 Creatinine 0.78, BUN 11, Potassium 3.7, Sodium 138, GFR >60 A complete set of results can be found in Results Review.  Recommendations:  Unable to reach.    Follow-up plan: ICM clinic phone appointment on 06/18/2019.   91 day device clinic remote transmission 05/29/2019.     Copy of ICM check sent to Dr. Rayann Heman.   3 month ICM trend: 05/14/2019    1 Year ICM trend:       Rosalene Billings, RN 05/15/2019 12:56 PM

## 2019-05-16 ENCOUNTER — Other Ambulatory Visit: Payer: Self-pay | Admitting: Cardiology

## 2019-05-16 DIAGNOSIS — I2510 Atherosclerotic heart disease of native coronary artery without angina pectoris: Secondary | ICD-10-CM

## 2019-05-16 DIAGNOSIS — I2583 Coronary atherosclerosis due to lipid rich plaque: Secondary | ICD-10-CM

## 2019-05-29 ENCOUNTER — Ambulatory Visit (INDEPENDENT_AMBULATORY_CARE_PROVIDER_SITE_OTHER): Payer: Medicare HMO | Admitting: *Deleted

## 2019-05-29 DIAGNOSIS — I5022 Chronic systolic (congestive) heart failure: Secondary | ICD-10-CM | POA: Diagnosis not present

## 2019-05-29 LAB — CUP PACEART REMOTE DEVICE CHECK
Battery Remaining Longevity: 67 mo
Battery Voltage: 2.99 V
Brady Statistic RV Percent Paced: 0.01 %
Date Time Interrogation Session: 20210112022824
HighPow Impedance: 71 Ohm
Implantable Lead Implant Date: 20150903
Implantable Lead Location: 753860
Implantable Lead Model: 6935
Implantable Pulse Generator Implant Date: 20150903
Lead Channel Impedance Value: 342 Ohm
Lead Channel Impedance Value: 399 Ohm
Lead Channel Pacing Threshold Amplitude: 1 V
Lead Channel Pacing Threshold Pulse Width: 0.4 ms
Lead Channel Sensing Intrinsic Amplitude: 15.75 mV
Lead Channel Sensing Intrinsic Amplitude: 15.75 mV
Lead Channel Setting Pacing Amplitude: 2 V
Lead Channel Setting Pacing Pulse Width: 0.4 ms
Lead Channel Setting Sensing Sensitivity: 0.3 mV

## 2019-06-18 ENCOUNTER — Ambulatory Visit (INDEPENDENT_AMBULATORY_CARE_PROVIDER_SITE_OTHER): Payer: Medicare HMO

## 2019-06-18 DIAGNOSIS — Z95810 Presence of automatic (implantable) cardiac defibrillator: Secondary | ICD-10-CM | POA: Diagnosis not present

## 2019-06-18 DIAGNOSIS — I5022 Chronic systolic (congestive) heart failure: Secondary | ICD-10-CM | POA: Diagnosis not present

## 2019-06-20 NOTE — Progress Notes (Signed)
EPIC Encounter for ICM Monitoring  Patient Name: Anna Beltran is a 69 y.o. female Date: 06/20/2019 Primary Care Physican: Patient, No Pcp Per Primary Cardiologist:Crenshaw Electrophysiologist:Allred LastWeight:236 lbs   Transmission reviewed.  Optivol thoracic impedance normal.  Prescribed: Furosemide20 mg take 2 tablets (40 mg total) daily.  Labs: 12/01/2018 Creatinine 0.78, BUN 11, Potassium 3.7, Sodium 138, GFR >60 A complete set of results can be found in Results Review.  Recommendations: None  Follow-up plan: ICM clinic phone appointment on3/12/2019. 91 day device clinic remote transmission 08/28/2019.   Copy of ICM check sent to Dr.Allred.   3 month ICM trend: 06/18/2019    1 Year ICM trend:       [name], RN 06/20/2019 1:10 PM

## 2019-07-17 ENCOUNTER — Other Ambulatory Visit: Payer: Self-pay | Admitting: Cardiology

## 2019-07-17 ENCOUNTER — Other Ambulatory Visit: Payer: Self-pay

## 2019-07-20 ENCOUNTER — Other Ambulatory Visit: Payer: Self-pay

## 2019-07-23 ENCOUNTER — Ambulatory Visit (INDEPENDENT_AMBULATORY_CARE_PROVIDER_SITE_OTHER): Payer: Medicare HMO

## 2019-07-23 DIAGNOSIS — Z95810 Presence of automatic (implantable) cardiac defibrillator: Secondary | ICD-10-CM

## 2019-07-23 DIAGNOSIS — I5022 Chronic systolic (congestive) heart failure: Secondary | ICD-10-CM | POA: Diagnosis not present

## 2019-07-24 ENCOUNTER — Telehealth: Payer: Self-pay

## 2019-07-24 NOTE — Telephone Encounter (Signed)
Remote ICM transmission received.  Attempted call to patient regarding ICM remote transmission and recording stated the person you called is unavailable right now and try again later.

## 2019-07-24 NOTE — Progress Notes (Signed)
EPIC Encounter for ICM Monitoring  Patient Name: Anna Beltran is a 69 y.o. female Date: 07/24/2019 Primary Care Physican: Patient, No Pcp Per Primary Cardiologist:Crenshaw Electrophysiologist:Allred LastWeight:236 lbs   Attempted call to patient and unable to reach.  Transmission reviewed.   Optivol thoracic impedance normal.  Prescribed: Furosemide20 mg take 2 tablets (40 mg total) daily.  Labs: 12/01/2018 Creatinine 0.78, BUN 11, Potassium 3.7, Sodium 138, GFR >60 A complete set of results can be found in Results Review.  Recommendations:Unable to reach.    Follow-up plan: ICM clinic phone appointment on4/14/2021.91 day device clinic remote transmission 08/28/2019.   Copy of ICM check sent to Dr.Allred.  3 month ICM trend: 07/23/2019    1 Year ICM trend:       [name], RN 07/24/2019 2:24 PM

## 2019-07-27 ENCOUNTER — Telehealth: Payer: Self-pay | Admitting: Cardiology

## 2019-07-27 NOTE — Telephone Encounter (Signed)
  Patient would like to get blood work done before her next appointment on 09/17/19 with Dr Jens Som. She would like for [place] to mail the order so she can get it done closer to home.

## 2019-07-27 NOTE — Telephone Encounter (Signed)
Pt calling to ask if she needs lab work before coming into the office. I advised her we can usually get lab work during the visit if necessary. She states she will come in fasting in case lipids are drawn.   She had no additional questions at this time.

## 2019-08-01 ENCOUNTER — Other Ambulatory Visit: Payer: Self-pay

## 2019-08-01 MED ORDER — OMEPRAZOLE 20 MG PO CPDR: 20.0000 mg | DELAYED_RELEASE_CAPSULE | Freq: Two times a day (BID) | ORAL | 1 refills | Status: DC

## 2019-08-02 ENCOUNTER — Telehealth: Payer: Self-pay | Admitting: Internal Medicine

## 2019-08-02 NOTE — Telephone Encounter (Signed)
*  STAT* If patient is at the pharmacy, call can be transferred to refill team.   1. Which medications need to be refilled? (please list name of each medication and dose if known) metFORMIN (GLUCOPHAGE) 1000 MG tablet  2. Which pharmacy/location (including street and city if local pharmacy) is medication to be sent to? [facility] [address], [place] - [address]  3. Do they need a 30 day or 90 day supply? 30 day  Patient is out of medication

## 2019-08-03 ENCOUNTER — Other Ambulatory Visit: Payer: Self-pay

## 2019-08-03 NOTE — Telephone Encounter (Signed)
Pt is requesting a refill on Metformin. Would Francis Dowse, PA like to refill this medication? Please address

## 2019-08-06 NOTE — Telephone Encounter (Signed)
Called pt but unable to leave VM, it has not been set up yet.

## 2019-08-07 NOTE — Telephone Encounter (Signed)
Patient states she is returning call in regards to medication refill. Please call.

## 2019-08-07 NOTE — Telephone Encounter (Signed)
Called pt back to inform her per Francis Dowse, PA, that pt would have to contact PCP for refill for her Metformin and if she has any other problems, questions or concerns, to give our office a call back. Pt verbalized understanding.

## 2019-08-09 ENCOUNTER — Ambulatory Visit: Payer: Medicare HMO

## 2019-08-09 ENCOUNTER — Ambulatory Visit: Payer: Medicare HMO | Attending: Internal Medicine

## 2019-08-09 DIAGNOSIS — Z23 Encounter for immunization: Secondary | ICD-10-CM

## 2019-08-09 NOTE — Progress Notes (Signed)
   Covid-19 Vaccination Clinic  Name:  Anna Beltran    MRN: [id number] DOB: [birth year]  08/09/2019  Anna Beltran was observed post Covid-19 immunization for 15 minutes without incident. She was provided with Vaccine Information Sheet and instruction to access the V-Safe system.   Anna Beltran was instructed to call 911 with any severe reactions post vaccine: Marland Kitchen Difficulty breathing  . Swelling of face and throat  . A fast heartbeat  . A bad rash all over body  . Dizziness and weakness   Immunizations Administered    Name Date Dose VIS Date Route   Moderna COVID-19 Vaccine 08/09/2019 10:32 AM 0.5 mL 04/17/2019 Intramuscular   Manufacturer: Moderna   Lot: [id number]   NDC: 16384-665-99

## 2019-08-10 ENCOUNTER — Other Ambulatory Visit: Payer: Self-pay

## 2019-08-16 ENCOUNTER — Telehealth: Payer: Self-pay | Admitting: Cardiology

## 2019-08-16 NOTE — Telephone Encounter (Signed)
     I went in pt's chart to try and find out who called her today.

## 2019-08-28 ENCOUNTER — Ambulatory Visit (INDEPENDENT_AMBULATORY_CARE_PROVIDER_SITE_OTHER): Payer: Medicare HMO | Admitting: *Deleted

## 2019-08-28 DIAGNOSIS — I5022 Chronic systolic (congestive) heart failure: Secondary | ICD-10-CM

## 2019-08-29 ENCOUNTER — Ambulatory Visit (INDEPENDENT_AMBULATORY_CARE_PROVIDER_SITE_OTHER): Payer: Medicare HMO

## 2019-08-29 DIAGNOSIS — I5022 Chronic systolic (congestive) heart failure: Secondary | ICD-10-CM | POA: Diagnosis not present

## 2019-08-29 DIAGNOSIS — Z95810 Presence of automatic (implantable) cardiac defibrillator: Secondary | ICD-10-CM

## 2019-08-29 LAB — CUP PACEART REMOTE DEVICE CHECK
Battery Remaining Longevity: 62 mo
Battery Voltage: 2.98 V
Brady Statistic RV Percent Paced: 0.01 %
Date Time Interrogation Session: 20210414151705
HighPow Impedance: 74 Ohm
Implantable Lead Implant Date: 20150903
Implantable Lead Location: 753860
Implantable Lead Model: 6935
Implantable Pulse Generator Implant Date: 20150903
Lead Channel Impedance Value: 361 Ohm
Lead Channel Impedance Value: 399 Ohm
Lead Channel Pacing Threshold Amplitude: 1 V
Lead Channel Pacing Threshold Pulse Width: 0.4 ms
Lead Channel Sensing Intrinsic Amplitude: 18.5 mV
Lead Channel Sensing Intrinsic Amplitude: 18.5 mV
Lead Channel Setting Pacing Amplitude: 2 V
Lead Channel Setting Pacing Pulse Width: 0.4 ms
Lead Channel Setting Sensing Sensitivity: 0.3 mV

## 2019-08-30 NOTE — Progress Notes (Signed)
ICD Remote  

## 2019-08-31 NOTE — Progress Notes (Signed)
EPIC Encounter for ICM Monitoring  Patient Name: Anna Beltran is a 69 y.o. female Date: 08/31/2019 Primary Care Physican: Patient, No Pcp Per Primary Cardiologist:Crenshaw Electrophysiologist:Allred LastWeight:236 lbs   Transmission reviewed.   Optivol thoracic impedance normal.  Prescribed: Furosemide20 mg take 2 tablets (40 mg total) daily.  Labs: 12/01/2018 Creatinine 0.78, BUN 11, Potassium 3.7, Sodium 138, GFR >60 A complete set of results can be found in Results Review.  Recommendations:None  Follow-up plan: ICM clinic phone appointment on5/17/2021.91 day device clinic remote transmission7/13/2021.   Copy of ICM check sent to Dr.Allred.  3 month ICM trend: 08/31/2019    1 Year ICM trend:       [name], RN 08/31/2019 8:46 AM

## 2019-09-11 ENCOUNTER — Ambulatory Visit: Payer: Medicare HMO | Attending: Internal Medicine

## 2019-09-11 DIAGNOSIS — Z23 Encounter for immunization: Secondary | ICD-10-CM

## 2019-09-11 NOTE — Progress Notes (Signed)
   Covid-19 Vaccination Clinic  Name:  Anna Beltran    MRN: [id number] DOB: 1950/07/06  09/11/2019  Anna Beltran was observed post Covid-19 immunization for 15 minutes without incident. She was provided with Vaccine Information Sheet and instruction to access the V-Safe system.   Anna Beltran was instructed to call 911 with any severe reactions post vaccine: Marland Kitchen Difficulty breathing  . Swelling of face and throat  . A fast heartbeat  . A bad rash all over body  . Dizziness and weakness   Immunizations Administered    Name Date Dose VIS Date Route   Moderna COVID-19 Vaccine 09/11/2019  9:39 AM 0.5 mL 04/2019 Intramuscular   Manufacturer: Moderna   Lot: [id number]   NDC: 14782-956-21

## 2019-09-13 ENCOUNTER — Other Ambulatory Visit: Payer: Self-pay

## 2019-09-13 NOTE — Progress Notes (Signed)
HPI: FU congestive heart failure/cardiomyopathy. Previously followed in [facility]. Carotid Dopplers in April 2014 showed less than 39% left carotid stenosis. Chest CT in March of 2014 showed no pulmonary embolus, small to moderate right effusion which was felt to be partially loculated. Cardiac catheterization in October of 2014 showed nonobstructive coronary disease and an ejection fraction of 30%. Right heart pressures were normal. Apparently patient has had poor compliance with treatment and poor tolerance of treatment based on outside records. ICD placed 9/15.Echocardiogram October 2020 showed ejection fraction 35 to 40%, severe left ventricular enlargement, moderate right atrial enlargement. Since she was last seen,she has some dyspnea on exertion and occasional mild pedal edema.  She denies chest pain or syncope.  Current Outpatient Medications  Medication Sig Dispense Refill  . amLODipine (NORVASC) 5 MG tablet Take 1 tablet (5 mg total) by mouth daily. 90 tablet 3  . aspirin 81 MG tablet Take 81 mg by mouth daily.    . carvedilol (COREG) 25 MG tablet TAKE 1 TABLET BY MOUTH TWICE DAILY WITH MEALS 180 tablet 1  . furosemide (LASIX) 20 MG tablet Take 20 mg by mouth 2 (two) times daily as needed.    Marland Kitchen glipiZIDE (GLUCOTROL) 5 MG tablet Take 5 mg by mouth 2 (two) times daily before a meal.     . hydrALAZINE (APRESOLINE) 100 MG tablet Take 1 tablet (100 mg total) by mouth 3 (three) times daily. 270 tablet 1  . metFORMIN (GLUCOPHAGE) 1000 MG tablet Take 1,000 mg by mouth 2 (two) times daily with a meal.     . omeprazole (PRILOSEC) 20 MG capsule Take 1 capsule (20 mg total) by mouth 2 (two) times daily. 180 capsule 1  . Polyvinyl Alcohol-Povidone (REFRESH OP) Place 1 drop into both eyes 2 (two) times daily as needed (for dry eyes).     . sacubitril-valsartan (ENTRESTO) 97-103 MG Take 1 tablet by mouth 2 (two) times daily. 60 tablet 1  . spironolactone (ALDACTONE) 25 MG tablet Take 1 tablet  (25 mg total) by mouth daily. 90 tablet 3   No current facility-administered medications for this visit.     Past Medical History:  Diagnosis Date  . Automatic implantable cardioverter-defibrillator in situ 01/17/2014   BY DR ALLRED  . Bruit    Bilat carotid duplex 09/05/12 CONCLUSION: Mild, less than or equal to 39%, left internal carotid artery stenosis.  . CHF (congestive heart failure) ([place])    MPI 09/05/12 NORMAL, showed no scar & LVEF is 20-25%. ECHO 12/11/12 LV is markedly dilated, Overall LV systolic function severely impaired with EF = 25-30%, pseudonormal LV filling pattern (consistant with elevated LA pressure).  . Diabetes mellitus, type 2 ([place])    TYPE 2  . Essential hypertension, benign   . Hyperlipidemia, mixed   . Hypersomnia, unspecified   . Hypertensive cardiomyopathy ([place])    By report there is poor compliance w/treatment, poor tolerance of treatment & poor symptom control. Symptoms include dyspnea & DOE.  Marland Kitchen Obesity   . PUD (peptic ulcer disease)   . Unspecified menopausal and postmenopausal disorder     Past Surgical History:  Procedure Laterality Date  . CARDIAC DEFIBRILLATOR PLACEMENT  01/17/2014   DR Rayann Heman  . EXCISION OF SKIN TAG N/A 12/01/2018   Procedure: EXCISION OF SKIN TAG ANAL;  Surgeon: Leighton Ruff, MD;  Location: [facility];  Service: General;  Laterality: N/A;  . IMPLANTABLE CARDIOVERTER DEFIBRILLATOR IMPLANT N/A 01/17/2014   Procedure: IMPLANTABLE CARDIOVERTER DEFIBRILLATOR IMPLANT;  Surgeon: Gardiner Rhyme, MD;  Location: [facility] CATH LAB;  Service: Cardiovascular;  Laterality: N/A;  . LEFT AND RIGHT HEART CATHETERIZATION WITH CORONARY ANGIOGRAM N/A 02/27/2013   Procedure: LEFT AND RIGHT HEART CATHETERIZATION WITH CORONARY ANGIOGRAM;  Surgeon: Rollene Rotunda, MD;  Location: [facility] CATH LAB;  Service: Cardiovascular;  Laterality: N/A;  . none      Social History   Socioeconomic History  . Marital status: Married    Spouse name: Not on file  . Number of  children: 5  . Years of education: Not on file  . Highest education level: Not on file  Occupational History    Comment: Retired  Tobacco Use  . Smoking status: Never Smoker  . Smokeless tobacco: Never Used  Substance and Sexual Activity  . Alcohol use: No  . Drug use: No  . Sexual activity: Never  Other Topics Concern  . Not on file  Social History Narrative   Lives in [place] with husband and son.  Retired from Actor of Corporate investment banker Strain:   . Difficulty of Paying Living Expenses:   Food Insecurity:   . Worried About Programme researcher, broadcasting/film/video in the Last Year:   . Barista in the Last Year:   Transportation Needs:   . Freight forwarder (Medical):   Marland Kitchen Lack of Transportation (Non-Medical):   Physical Activity:   . Days of Exercise per Week:   . Minutes of Exercise per Session:   Stress:   . Feeling of Stress :   Social Connections:   . Frequency of Communication with Friends and Family:   . Frequency of Social Gatherings with Friends and Family:   . Attends Religious Services:   . Active Member of Clubs or Organizations:   . Attends Banker Meetings:   Marland Kitchen Marital Status:   Intimate Partner Violence:   . Fear of Current or Ex-Partner:   . Emotionally Abused:   Marland Kitchen Physically Abused:   . Sexually Abused:     Family History  Problem Relation Age of Onset  . Diabetes Mellitus II Mother   . Cancer Brother        Colon  . Hypertension Father     ROS: no fevers or chills, productive cough, hemoptysis, dysphasia, odynophagia, melena, hematochezia, dysuria, hematuria, rash, seizure activity, orthopnea, PND, pedal edema, claudication. Remaining systems are negative.  Physical Exam: Well-developed obese in no acute distress.  Skin is warm and dry.  HEENT is normal.  Neck is supple.  Chest is clear to auscultation with normal expansion.  Cardiovascular exam is regular rate and rhythm.  Abdominal exam  nontender or distended. No masses palpated. Extremities show trace edema. neuro grossly intact  A/P  1 nonischemic cardiomyopathy-LV function mildly improved on most recent echocardiogram.  Continue Entresto and beta-blocker.  2 chronic systolic congestive heart failure-patient appears to be euvolemic on examination.  Continue diuretic at present dose.   3 coronary artery disease-patient denies chest pain.  Continue medical therapy with aspirin and statin.  4 hypertension-blood pressure elevated; increase amlodipine to 10 mg daily and follow.  5 hyperlipidemia-continue statin.  6 prior ICD-followed by electrophysiology.  7 morbid obesity-we discussed diet, exercise and weight loss.  8 dyspnea-recent proBNP normal.  Her saturations are running low in the high 80s by her report.  She has an appointment with pulmonary and will likely need home oxygen (she has been told this previously).  Olga Millers,  MD

## 2019-09-14 ENCOUNTER — Other Ambulatory Visit: Payer: Self-pay

## 2019-09-14 ENCOUNTER — Encounter: Payer: Self-pay | Admitting: Student

## 2019-09-14 ENCOUNTER — Ambulatory Visit (INDEPENDENT_AMBULATORY_CARE_PROVIDER_SITE_OTHER): Payer: Medicare HMO | Admitting: Student

## 2019-09-14 VITALS — BP 118/68 | HR 78 | Ht 64.0 in | Wt 238.8 lb

## 2019-09-14 DIAGNOSIS — Z79899 Other long term (current) drug therapy: Secondary | ICD-10-CM | POA: Diagnosis not present

## 2019-09-14 DIAGNOSIS — I2510 Atherosclerotic heart disease of native coronary artery without angina pectoris: Secondary | ICD-10-CM | POA: Diagnosis not present

## 2019-09-14 DIAGNOSIS — I5022 Chronic systolic (congestive) heart failure: Secondary | ICD-10-CM | POA: Diagnosis not present

## 2019-09-14 DIAGNOSIS — I10 Essential (primary) hypertension: Secondary | ICD-10-CM | POA: Diagnosis not present

## 2019-09-14 DIAGNOSIS — I2583 Coronary atherosclerosis due to lipid rich plaque: Secondary | ICD-10-CM | POA: Diagnosis not present

## 2019-09-14 MED ORDER — SPIRONOLACTONE 25 MG PO TABS: 25.0000 mg | ORAL_TABLET | Freq: Every day | ORAL | 3 refills | Status: DC

## 2019-09-14 NOTE — Patient Instructions (Addendum)
Medication Instructions:  START SPIRONOLACTONE 25 mg Daily *If you need a refill on your cardiac medications before your next appointment, please call your pharmacy*   Lab Work: TODAY BMET PRO BNP   BMET ([place] 2 WEEKS) If you have labs (blood work) drawn today and your tests are completely normal, you will receive your results only by: Marland Kitchen MyChart Message (if you have MyChart) OR . A paper copy in the mail If you have any lab test that is abnormal or we need to change your treatment, we will call you to review the results.   Testing/Procedures: NONE   Follow-Up: At [facility], you and your health needs are our priority.  As part of our continuing mission to provide you with exceptional heart care, we have created designated Provider Care Teams.  These Care Teams include your primary Cardiologist (physician) and Advanced Practice Providers (APPs -  Physician Assistants and Nurse Practitioners) who all work together to provide you with the care you need, when you need it.  We recommend signing up for the patient portal called "MyChart".  Sign up information is provided on this After Visit Summary.  MyChart is used to connect with patients for Virtual Visits (Telemedicine).  Patients are able to view lab/test results, encounter notes, upcoming appointments, etc.  Non-urgent messages can be sent to your provider as well.   To learn more about what you can do with MyChart, go to [email or web address].    Your next appointment:   1 year(s)  The format for your next appointment:   Either In Person or Virtual  Provider:   Dr Rayann Heman   Other Instructions Remote monitoring is used to monitor your ICD from home. This monitoring reduces the number of office visits required to check your device to one time per year. It allows [place] to keep an eye on the functioning of your device to ensure it is working properly. You are scheduled for a device check from home on 11/27/19. You  may send your transmission at any time that day. If you have a wireless device, the transmission will be sent automatically. After your physician reviews your transmission, you will receive a postcard with your next transmission date.  Spironolactone Oral Tablets What is this medicine? SPIRONOLACTONE (speer on [place] LAK tone) is a diuretic. It helps you make more urine and to lose excess water from your body. This medicine is used to treat high blood pressure, and edema or swelling from heart, kidney, or liver disease. It is also used to treat patients who make too much aldosterone or have low potassium. This medicine may be used for other purposes; ask your health care provider or pharmacist if you have questions. COMMON BRAND NAME(S): Aldactone What should I tell my health care provider before I take this medicine? They need to know if you have any of these conditions:  high blood level of potassium  kidney disease or trouble making urine  liver disease  an unusual or allergic reaction to spironolactone, other medicines, foods, dyes, or preservatives  pregnant or trying to get pregnant  breast-feeding How should I use this medicine? Take this drug by mouth. Take it as directed on the prescription label at the same time every day. You can take it with or without food. You should always take it the same way. Keep taking it unless your health care provider tells you to stop. Talk to your health care provider about the use of this drug in children.  Special care may be needed. Overdosage: If you think you have taken too much of this medicine contact a poison control center or emergency room at once. NOTE: This medicine is only for you. Do not share this medicine with others. What if I miss a dose? If you miss a dose, take it as soon as you can. If it is almost time for your next dose, take only that dose. Do not take double or extra doses. What may interact with this medicine? Do not take this  medicine with any of the following medications:  cidofovir  eplerenone  tranylcypromine This medicine may also interact with the following medications:  aspirin  certain medicines for blood pressure or heart disease like benazepril, lisinopril, losartan, valsartan  certain medicines that treat or prevent blood clots like heparin and enoxaparin  cholestyramine  cyclosporine  digoxin  lithium  medicines that relax muscles for surgery  NSAIDs, medicines for pain and inflammation, like ibuprofen or naproxen  other diuretics  potassium supplements  steroid medicines like prednisone or cortisone  trimethoprim This list may not describe all possible interactions. Give your health care provider a list of all the medicines, herbs, non-prescription drugs, or dietary supplements you use. Also tell them if you smoke, drink alcohol, or use illegal drugs. Some items may interact with your medicine. What should I watch for while using this medicine? Visit your doctor or health care professional for regular checks on your progress. Check your blood pressure as directed. Ask your doctor what your blood pressure should be, and when you should contact them. You may need to be on a special diet while taking this medicine. Ask your doctor. Also, ask how many glasses of fluid you need to drink a day. You must not get dehydrated. This medicine may make you feel confused, dizzy or lightheaded. Drinking alcohol and taking some medicines can make this worse. Do not drive, use machinery, or do anything that needs mental alertness until you know how this medicine affects you. Do not sit or stand up quickly. What side effects may I notice from receiving this medicine? Side effects that you should report to your doctor or health care professional as soon as possible:  allergic reactions such as skin rash or itching, hives, swelling of the lips, mouth, tongue, or throat  black or tarry stools  fast,  irregular heartbeat  fever  muscle pain, cramps  numbness, tingling in hands or feet  trouble breathing  trouble passing urine  unusual bleeding  unusually weak or tired Side effects that usually do not require medical attention (report to your doctor or health care professional if they continue or are bothersome):  change in voice or hair growth  confusion  dizzy, drowsy  dry mouth, increased thirst  enlarged or tender breasts  headache  irregular menstrual periods  sexual difficulty, unable to have an erection  stomach upset This list may not describe all possible side effects. Call your doctor for medical advice about side effects. You may report side effects to FDA at 1-800-FDA-1088. Where should I keep my medicine? Keep out of the reach of children and pets. Store at room temperature between 20 and 25 degrees C (68 and 77 degrees F). Throw away any unused drug after the expiration date. NOTE: This sheet is a summary. It may not cover all possible information. If you have questions about this medicine, talk to your doctor, pharmacist, or health care provider.  2020 Elsevier/Gold Standard (2018-12-26 12:38:34)

## 2019-09-14 NOTE — Progress Notes (Signed)
Electrophysiology Office Note Date: 09/14/2019  ID:  Anna, Beltran [birth year], MRN [id number]  PCP: Rebecka Apley, NP Primary Cardiologist: Olga Millers, MD Electrophysiologist: Hillis Range, MD   CC: Routine ICD follow-up  Anna Beltran is a 69 y.o. female seen today for Hillis Range, MD for routine electrophysiology followup.  Since last being seen in our clinic the patient reports doing well overall. She has chronic DOE. She is SOB with mild exertion. She sleeps upright due to her back. She had a negative sleep study by her report. Edema overall well controlled on lasix. She denies chest pain, palpitations, , PND, orthopnea, nausea, vomiting, dizziness, syncope, weight gain, or early satiety. She has not had ICD shocks.   Device History: Medtronic Single Chamber ICD implanted 01/2014 for Chronic systolic CHF History of appropriate therapy: No History of AAD therapy: No   Past Medical History:  Diagnosis Date  . Automatic implantable cardioverter-defibrillator in situ 01/17/2014   BY DR ALLRED  . Bruit    Bilat carotid duplex 09/05/12 CONCLUSION: Mild, less than or equal to 39%, left internal carotid artery stenosis.  . CHF (congestive heart failure) ([facility])    MPI 09/05/12 NORMAL, showed no scar & LVEF is 20-25%. ECHO 12/11/12 LV is markedly dilated, Overall LV systolic function severely impaired with EF = 25-30%, pseudonormal LV filling pattern (consistant with elevated LA pressure).  . Diabetes mellitus, type 2 ([facility])    TYPE 2  . Essential hypertension, benign   . Hyperlipidemia, mixed   . Hypersomnia, unspecified   . Hypertensive cardiomyopathy ([facility])    By report there is poor compliance w/treatment, poor tolerance of treatment & poor symptom control. Symptoms include dyspnea & DOE.  Marland Kitchen Obesity   . PUD (peptic ulcer disease)   . Unspecified menopausal and postmenopausal disorder    Past Surgical History:  Procedure Laterality Date  . CARDIAC DEFIBRILLATOR  PLACEMENT  01/17/2014   DR Johney Frame  . EXCISION OF SKIN TAG N/A 12/01/2018   Procedure: EXCISION OF SKIN TAG ANAL;  Surgeon: Romie Levee, MD;  Location: [facility];  Service: General;  Laterality: N/A;  . IMPLANTABLE CARDIOVERTER DEFIBRILLATOR IMPLANT N/A 01/17/2014   Procedure: IMPLANTABLE CARDIOVERTER DEFIBRILLATOR IMPLANT;  Surgeon: Gardiner Rhyme, MD;  Location: [facility] CATH LAB;  Service: Cardiovascular;  Laterality: N/A;  . LEFT AND RIGHT HEART CATHETERIZATION WITH CORONARY ANGIOGRAM N/A 02/27/2013   Procedure: LEFT AND RIGHT HEART CATHETERIZATION WITH CORONARY ANGIOGRAM;  Surgeon: Rollene Rotunda, MD;  Location: [facility] CATH LAB;  Service: Cardiovascular;  Laterality: N/A;  . none      Current Outpatient Medications  Medication Sig Dispense Refill  . amLODipine (NORVASC) 5 MG tablet Take 1 tablet (5 mg total) by mouth daily. 90 tablet 3  . aspirin 81 MG tablet Take 81 mg by mouth daily.    . carvedilol (COREG) 25 MG tablet TAKE 1 TABLET BY MOUTH TWICE DAILY WITH MEALS 180 tablet 1  . furosemide (LASIX) 20 MG tablet Take 20 mg by mouth 2 (two) times daily as needed.    Marland Kitchen glipiZIDE (GLUCOTROL) 5 MG tablet Take 5 mg by mouth 2 (two) times daily before a meal.     . hydrALAZINE (APRESOLINE) 100 MG tablet Take 1 tablet (100 mg total) by mouth 3 (three) times daily. 270 tablet 1  . metFORMIN (GLUCOPHAGE) 1000 MG tablet Take 1,000 mg by mouth 2 (two) times daily with a meal.     . omeprazole (PRILOSEC) 20 MG capsule Take 1  capsule (20 mg total) by mouth 2 (two) times daily. 180 capsule 1  . Polyvinyl Alcohol-Povidone (REFRESH OP) Place 1 drop into both eyes 2 (two) times daily as needed (for dry eyes).     . sacubitril-valsartan (ENTRESTO) 97-103 MG Take 1 tablet by mouth 2 (two) times daily. 60 tablet 1  . spironolactone (ALDACTONE) 25 MG tablet Take 1 tablet (25 mg total) by mouth daily. 90 tablet 3   No current facility-administered medications for this visit.    Allergies:   Isosorbide nitrate    Social History: Social History   Socioeconomic History  . Marital status: Married    Spouse name: Not on file  . Number of children: 5  . Years of education: Not on file  . Highest education level: Not on file  Occupational History    Comment: Retired  Tobacco Use  . Smoking status: Never Smoker  . Smokeless tobacco: Never Used  Substance and Sexual Activity  . Alcohol use: No  . Drug use: No  . Sexual activity: Never  Other Topics Concern  . Not on file  Social History Narrative   Lives in [place] with husband and son.  Retired from Actor of Corporate investment banker Strain:   . Difficulty of Paying Living Expenses:   Food Insecurity:   . Worried About Programme researcher, broadcasting/film/video in the Last Year:   . Barista in the Last Year:   Transportation Needs:   . Freight forwarder (Medical):   Marland Kitchen Lack of Transportation (Non-Medical):   Physical Activity:   . Days of Exercise per Week:   . Minutes of Exercise per Session:   Stress:   . Feeling of Stress :   Social Connections:   . Frequency of Communication with Friends and Family:   . Frequency of Social Gatherings with Friends and Family:   . Attends Religious Services:   . Active Member of Clubs or Organizations:   . Attends Banker Meetings:   Marland Kitchen Marital Status:   Intimate Partner Violence:   . Fear of Current or Ex-Partner:   . Emotionally Abused:   Marland Kitchen Physically Abused:   . Sexually Abused:     Family History: Family History  Problem Relation Age of Onset  . Diabetes Mellitus II Mother   . Cancer Brother        Colon  . Hypertension Father     Review of Systems: All other systems reviewed and are otherwise negative except as noted above.   Physical Exam: Vitals:   09/14/19 1240  BP: 118/68  Pulse: 78  SpO2: 93%  Weight: 238 lb 12.8 oz (108.3 kg)  Height: 5\' 4"  (1.626 m)     GEN- The patient is well appearing, alert and oriented x 3 today.    HEENT: normocephalic, atraumatic; sclera clear, conjunctiva pink; hearing intact; oropharynx clear; neck supple, no JVP Lymph- no cervical lymphadenopathy Lungs- Clear to ausculation bilaterally, normal work of breathing.  No wheezes, rales, rhonchi Heart- Regular rate and rhythm, no murmurs, rubs or gallops, PMI not laterally displaced GI- soft, non-tender, non-distended, bowel sounds present, no hepatosplenomegaly Extremities- no clubbing, cyanosis, or edema; DP/PT/radial pulses 2+ bilaterally MS- no significant deformity or atrophy Skin- warm and dry, no rash or lesion; ICD pocket well healed Psych- euthymic mood, full affect Neuro- strength and sensation are intact  ICD interrogation- reviewed in detail today,  See PACEART report  EKG:  EKG  is ordered today. The ekg ordered today shows NSR at 78 bpm, QRS 102 ms.   Recent Labs: 12/01/2018: BUN 11; Creatinine, Ser 0.78; Hemoglobin 13.0; Platelets 189; Potassium 3.7; Sodium 138   Wt Readings from Last 3 Encounters:  09/14/19 238 lb 12.8 oz (108.3 kg)  02/08/19 236 lb (107 kg)  12/01/18 233 lb 3.2 oz (105.8 kg)     Other studies Reviewed: Additional studies/ records that were reviewed today include: Most recent Echo (EF 35-40% 02/2019), Previous EP office notes, Previous remotes, previous    Assessment and Plan:  1.  Chronic systolic dysfunction s/p Medtronic single chamber ICD  NYHA III-IIIb symptoms Volume status appears stable on exam today Normal ICD function See Pace Art report No changes today Restart spironolactone 25 mg daily. She is not sure why/when this came off.  We discussed Barostim therapy at length. Pt is not interested at this time.  EF borderline for barostim consideration. NT-pro BNP today for completeness.  2. HTN Stable. Meds as above.  Current medicines are reviewed at length with the patient today.   The patient does not have concerns regarding her medicines.  The following changes were made today:   Restarted on spironolactone 25 mg daily.  Labs/ tests ordered today include:  Orders Placed This Encounter  Procedures  . Basic Metabolic Panel (BMET)  . Pro b natriuretic peptide (BNP)  . Basic Metabolic Panel (BMET)  . EKG 12-Lead   Disposition:   Follow up with EP APP in 12 months.   Jacalyn Lefevre, PA-C  09/14/2019 1:09 PM  [place] [address] [phone] (office) [phone] (fax)

## 2019-09-15 LAB — BASIC METABOLIC PANEL
BUN/Creatinine Ratio: 19 (ref 12–28)
BUN: 20 mg/dL (ref 8–27)
CO2: 28 mmol/L (ref 20–29)
Calcium: 10.5 mg/dL — ABNORMAL HIGH (ref 8.7–10.3)
Chloride: 104 mmol/L (ref 96–106)
Creatinine, Ser: 1.06 mg/dL — ABNORMAL HIGH (ref 0.57–1.00)
GFR calc Af Amer: 61 mL/min/{1.73_m2} (ref >59)
GFR calc non Af Amer: 53 mL/min/{1.73_m2} — ABNORMAL LOW (ref >59)
Glucose: 77 mg/dL (ref 65–99)
Potassium: 4.2 mmol/L (ref 3.5–5.2)
Sodium: 145 mmol/L — ABNORMAL HIGH (ref 134–144)

## 2019-09-15 LAB — PRO B NATRIURETIC PEPTIDE: NT-Pro BNP: 44 pg/mL (ref 0–301)

## 2019-09-17 ENCOUNTER — Other Ambulatory Visit: Payer: Self-pay

## 2019-09-17 ENCOUNTER — Ambulatory Visit: Payer: Medicare HMO | Admitting: Cardiology

## 2019-09-17 ENCOUNTER — Encounter: Payer: Self-pay | Admitting: Cardiology

## 2019-09-17 VITALS — BP 144/82 | HR 74 | Temp 97.3°F | Wt 240.0 lb

## 2019-09-17 DIAGNOSIS — I10 Essential (primary) hypertension: Secondary | ICD-10-CM

## 2019-09-17 DIAGNOSIS — I428 Other cardiomyopathies: Secondary | ICD-10-CM | POA: Diagnosis not present

## 2019-09-17 DIAGNOSIS — I5022 Chronic systolic (congestive) heart failure: Secondary | ICD-10-CM | POA: Diagnosis not present

## 2019-09-17 MED ORDER — AMLODIPINE BESYLATE 10 MG PO TABS: 10.0000 mg | ORAL_TABLET | Freq: Every day | ORAL | 3 refills | Status: DC

## 2019-09-17 NOTE — Patient Instructions (Signed)
Medication Instructions:  INCREASE AMLODIPINE TO 10 MG ONCE DAILY= 2 OF THE 5 MG TABLETS ONCE DAILY  *If you need a refill on your cardiac medications before your next appointment, please call your pharmacy*   Lab Work: If you have labs (blood work) drawn today and your tests are completely normal, you will receive your results only by: Marland Kitchen MyChart Message (if you have MyChart) OR . A paper copy in the mail If you have any lab test that is abnormal or we need to change your treatment, we will call you to review the results.   Follow-Up: At [facility], you and your health needs are our priority.  As part of our continuing mission to provide you with exceptional heart care, we have created designated Provider Care Teams.  These Care Teams include your primary Cardiologist (physician) and Advanced Practice Providers (APPs -  Physician Assistants and Nurse Practitioners) who all work together to provide you with the care you need, when you need it.  We recommend signing up for the patient portal called "MyChart".  Sign up information is provided on this After Visit Summary.  MyChart is used to connect with patients for Virtual Visits (Telemedicine).  Patients are able to view lab/test results, encounter notes, upcoming appointments, etc.  Non-urgent messages can be sent to your provider as well.   To learn more about what you can do with MyChart, go to [email or web address].    Your next appointment:   6 month(s)  The format for your next appointment:   Either In Person or Virtual  Provider:   You may see Olga Millers, MD or one of the following Advanced Practice Providers on your designated Care Team:    Corine Shelter, PA-C  [place], [place]  Edd Fabian, [place]

## 2019-09-28 ENCOUNTER — Other Ambulatory Visit: Payer: Medicare HMO

## 2019-10-01 ENCOUNTER — Ambulatory Visit (INDEPENDENT_AMBULATORY_CARE_PROVIDER_SITE_OTHER): Payer: Medicare HMO

## 2019-10-01 DIAGNOSIS — I5022 Chronic systolic (congestive) heart failure: Secondary | ICD-10-CM

## 2019-10-01 DIAGNOSIS — Z95810 Presence of automatic (implantable) cardiac defibrillator: Secondary | ICD-10-CM | POA: Diagnosis not present

## 2019-10-02 ENCOUNTER — Telehealth: Payer: Self-pay

## 2019-10-02 NOTE — Telephone Encounter (Signed)
Spoke with patient to remind of missed remote transmission 

## 2019-10-03 NOTE — Progress Notes (Signed)
EPIC Encounter for ICM Monitoring  Patient Name: Anna Beltran is a 69 y.o. female Date: 10/03/2019 Primary Care Physican: Rebecka Apley, NP Primary Cardiologist:Crenshaw Electrophysiologist:Allred 5/19/2021Weight:238 lbs (236 lbs baseline)  Spoke with patient. She reports weight is up about 2 lbs from baseline but it will decrease once she takes PRN Furosemide.  Optivol thoracic impedance normal.  Prescribed: Furosemide20 mg take 2 tablets (40 mg total) daily as needed.  Labs: 09/14/2019 Creatinine 1.06, BUN 20, Potassium 4.2, Sodium 145, GFR 53-61 A complete set of results can be found in Results Review.  Recommendations:She plans to is take PRN Furosemide for next 2 days.  Follow-up plan: ICM clinic phone appointment on6/21/2021.91 day device clinic remote transmission7/13/2021.   Copy of ICM check sent to Dr.Allred and Dr Jens Som.  3 month ICM trend: 10/02/2019    1 Year ICM trend:       [name], RN 10/03/2019 12:02 PM

## 2019-10-29 ENCOUNTER — Other Ambulatory Visit: Payer: Self-pay

## 2019-10-29 ENCOUNTER — Other Ambulatory Visit: Payer: Medicare HMO | Admitting: *Deleted

## 2019-10-29 DIAGNOSIS — I10 Essential (primary) hypertension: Secondary | ICD-10-CM

## 2019-10-29 DIAGNOSIS — I2510 Atherosclerotic heart disease of native coronary artery without angina pectoris: Secondary | ICD-10-CM

## 2019-10-29 DIAGNOSIS — Z79899 Other long term (current) drug therapy: Secondary | ICD-10-CM

## 2019-10-29 DIAGNOSIS — I5022 Chronic systolic (congestive) heart failure: Secondary | ICD-10-CM

## 2019-10-29 DIAGNOSIS — I2583 Coronary atherosclerosis due to lipid rich plaque: Secondary | ICD-10-CM

## 2019-10-29 LAB — BASIC METABOLIC PANEL
BUN/Creatinine Ratio: 17 (ref 12–28)
BUN: 16 mg/dL (ref 8–27)
CO2: 24 mmol/L (ref 20–29)
Calcium: 10.2 mg/dL (ref 8.7–10.3)
Chloride: 104 mmol/L (ref 96–106)
Creatinine, Ser: 0.94 mg/dL (ref 0.57–1.00)
GFR calc Af Amer: 71 mL/min/{1.73_m2} (ref >59)
GFR calc non Af Amer: 62 mL/min/{1.73_m2} (ref >59)
Glucose: 173 mg/dL — ABNORMAL HIGH (ref 65–99)
Potassium: 4 mmol/L (ref 3.5–5.2)
Sodium: 142 mmol/L (ref 134–144)

## 2019-11-06 ENCOUNTER — Telehealth: Payer: Self-pay

## 2019-11-06 NOTE — Telephone Encounter (Signed)
Patient returned call. I informed her of the message below. She stated she would send in a transmission today.

## 2019-11-06 NOTE — Telephone Encounter (Signed)
Left message for patient to remind of missed remote transmission.  

## 2019-11-12 NOTE — Progress Notes (Signed)
No ICM remote transmission received for 11/05/2019 and next ICM transmission scheduled for 11/28/2019.   

## 2019-11-22 ENCOUNTER — Ambulatory Visit (HOSPITAL_COMMUNITY)
Admission: RE | Admit: 2019-11-22 | Discharge: 2019-11-22 | Disposition: A | Discharge status: Home / Self Care | Payer: Medicare HMO | Source: Ambulatory Visit | Attending: Pulmonary Disease | Admitting: Pulmonary Disease

## 2019-11-22 ENCOUNTER — Ambulatory Visit: Payer: Medicare HMO | Admitting: Pulmonary Disease

## 2019-11-22 ENCOUNTER — Encounter: Payer: Self-pay | Admitting: Pulmonary Disease

## 2019-11-22 ENCOUNTER — Other Ambulatory Visit: Payer: Self-pay

## 2019-11-22 VITALS — BP 130/80 | HR 74 | Temp 97.7°F | Ht 64.0 in | Wt 238.0 lb

## 2019-11-22 DIAGNOSIS — R0600 Dyspnea, unspecified: Secondary | ICD-10-CM

## 2019-11-22 DIAGNOSIS — R0609 Other forms of dyspnea: Secondary | ICD-10-CM

## 2019-11-22 DIAGNOSIS — J4540 Moderate persistent asthma, uncomplicated: Secondary | ICD-10-CM | POA: Diagnosis not present

## 2019-11-22 DIAGNOSIS — J9611 Chronic respiratory failure with hypoxia: Secondary | ICD-10-CM

## 2019-11-22 DIAGNOSIS — I5022 Chronic systolic (congestive) heart failure: Secondary | ICD-10-CM | POA: Diagnosis not present

## 2019-11-22 MED ORDER — FLOVENT HFA 110 MCG/ACT IN AERO
2.0000 | INHALATION_SPRAY | Freq: Two times a day (BID) | RESPIRATORY_TRACT | 12 refills | Status: DC
Start: 2019-11-22 — End: 2020-10-21

## 2019-11-22 NOTE — Addendum Note (Signed)
Addended by: Benjie Karvonen R on: 11/22/2019 12:27 PM   Modules accepted: Orders

## 2019-11-22 NOTE — Progress Notes (Signed)
[facility]  Chief Complaint  Patient presents with   Consult    Patient has shortness of breath with exertion. Productive cough with clear mucus that started 2 weeks ago.     Constitutional:  BP 130/80 (BP Location: Left Arm, Cuff Size: Large)    Pulse 74    Temp 97.7 F (36.5 C) (Oral)    Ht 5\' 4"  (1.626 m)    Wt 238 lb (108 kg)    SpO2 97%    BMI 40.85 kg/m   Past Medical History:  Systolic CHF with non ischemic CM, s/p AICD, DM, HTN, HLD, PUD  Summary:  Anna Beltran is a 69 y.o. female with dyspnea.  Subjective:   She was seen by Dr. 66 last year before he retired.  She was told she had lung scarring and needed oxygen.  The DME wanted her bank account information before setting up oxygen.  She didn't want to share this information and never got oxygen set up.  She gets intermittent episodes of feeling short of breath.  This is associated with cough and clear sputum.  She gets tight in her chest chest and will wheeze.  These episodes happen more often when she has a cold, when her allergies flare up, or when the weather is damp.  She was never told she has asthma.  She denies skin rash.  No food allergies.    She never smoked.  [place]  No history of pneumonia or TB.  She feels her sleep is okay, and not aware of any issues with snoring.  She dropped below 86% on room air with standing today.  She was recovered on 2 liters oxygen with improvement in her SpO2 to above 95%.   Physical Exam:   Appearance - well kempt  ENMT - no sinus tenderness, no nasal discharge, no oral exudate, Mallampati 4  Respiratory - no wheeze, or rales  CV - regular rate and rhythm, no murmurs  GI - soft, non tender  Lymph - no adenopathy noted in neck  Ext - mild ankle edema  Skin - no rashes  Neuro - normal strength, oriented x 3  Psych - normal mood and affect  Discussion:  She has intermittent episodes of dyspnea on  exertion.  Her symptoms are suggestive of asthma.  Her previous PFT showed bronchodilator responsiveness and air trapping, both of which would be consistent with asthma.  She also has systolic CHF and could be having intermittent episodes of heart failure exacerbation contributing to shortness of breath.  Finally, she was again noted to have oxygen desaturation with exertion and will need to be started on supplemental oxygen.  Assessment/Plan:   Dyspnea on exertion with possible asthma. - trial of flovent - repeat chest xray and then determine if she needs a HRCT chest  Chronic respiratory failure with hypoxia. 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 - followed by Dr. Juanetta Gosling with cardiology   A total of  48 minutes spent addressing patient care issues on day of visit.  Follow up:  Patient Instructions  Flovent two puffs twice per day and rinse mouth after each use  Will arrange for home oxygen set up  Will arrange for chest xray and overnight oxygen test  Follow up in 4 weeks   Signature:  Coralyn Helling, MD [facility] Pager: [phone] 11/22/2019, 10:15 AM  Flow Sheet     Pulmonary tests:   PFT 07/26/18 >> FEV1 1.14 (61%), FEV1% 79, TLC 4.19 (82%), RV:TLC 141, DLCO 67%, +BD from FEF 25-75  Sleep tests:    Cardiac tests:   Echo 02/15/19 >> EF 35 to 40%  Medications:   Allergies as of 11/22/2019      Reactions   Isosorbide Nitrate Other (See Comments)   Very bad headaches      Medication List       Accurate as of November 22, 2019 10:15 AM. If you have any questions, ask your nurse or doctor.        amLODipine 10 MG tablet Commonly known as: NORVASC Take 1 tablet (10 mg total) by mouth  daily.   aspirin 81 MG tablet Take 81 mg by mouth daily.   carvedilol 25 MG tablet Commonly known as: COREG TAKE 1 TABLET BY MOUTH TWICE DAILY WITH MEALS   Entresto 97-103 MG Generic drug: sacubitril-valsartan Take 1 tablet by mouth 2 (two) times daily.   Flovent HFA 110 MCG/ACT inhaler Generic drug: fluticasone Inhale 2 puffs into the lungs 2 (two) times daily. Started by: Coralyn Helling, MD   furosemide 20 MG tablet Commonly known as: LASIX Take 20 mg by mouth 2 (two) times daily as needed.   glipiZIDE 5 MG tablet Commonly known as: GLUCOTROL Take 5 mg by mouth 2 (two) times daily before a meal.   hydrALAZINE 100 MG tablet Commonly known as: APRESOLINE Take 1 tablet (100 mg total) by mouth 3 (three) times daily.   metFORMIN 1000 MG tablet Commonly known as: GLUCOPHAGE Take 1,000 mg by mouth 2 (two) times daily with a meal.   omeprazole 20 MG capsule Commonly known as: PRILOSEC Take 1 capsule (20 mg total) by mouth 2 (two) times daily.   REFRESH OP Place 1 drop into both eyes 2 (two) times daily as needed (for dry eyes).   spironolactone 25 MG tablet Commonly known as: ALDACTONE Take 1 tablet (25 mg total) by mouth daily.       Past Surgical History:  She  has a past surgical history that includes none; Cardiac defibrillator placement (01/17/2014); left and right heart catheterization with coronary angiogram (N/A, 02/27/2013); implantable cardioverter defibrillator implant (N/A, 01/17/2014); and Excision of skin tag (N/A, 12/01/2018).  Family History:  Her family history includes Cancer in her brother; Diabetes Mellitus II in her mother; Hypertension in her father.  Social History:  She  reports that she has never smoked. She has never used smokeless tobacco. She reports that she does not drink alcohol and does not use drugs.

## 2019-11-22 NOTE — Patient Instructions (Addendum)
Flovent two puffs twice per day and rinse mouth after each use  Will arrange for home oxygen set up  Will arrange for chest xray and overnight oxygen test  Follow up in 4 weeks

## 2019-11-23 ENCOUNTER — Telehealth: Payer: Self-pay | Admitting: Pulmonary Disease

## 2019-11-23 NOTE — Telephone Encounter (Signed)
DG Chest 2 View  Result Date: 11/22/2019 CLINICAL DATA:  Dyspnea EXAM: CHEST - 2 VIEW COMPARISON:  07/24/2018 FINDINGS: Left-sided pacing device. Cardiomegaly with central congestion. Possible tiny right pleural effusion. No consolidation. Aortic atherosclerosis. No pneumothorax. IMPRESSION: Cardiomegaly with central vascular congestion and possible tiny right effusion. Electronically Signed   By: Jasmine Pang M.D.   On: 11/22/2019 22:49     Please let her know her chest xray shows changes of chronic congestive heart failure.  No other significant findings.  She should continue taking her heart failure medications as prescribed by cardiology.

## 2019-11-23 NOTE — Telephone Encounter (Signed)
Patient called with results of recent CXR. Patient verbalized understanding of results.

## 2019-11-26 ENCOUNTER — Encounter: Payer: Self-pay | Admitting: Pulmonary Disease

## 2019-12-03 NOTE — Progress Notes (Signed)
No ICM remote transmission received for 11/27/2019 and next ICM transmission scheduled for 12/24/2019.   

## 2019-12-05 ENCOUNTER — Telehealth: Payer: Self-pay | Admitting: Pulmonary Disease

## 2019-12-05 DIAGNOSIS — R0902 Hypoxemia: Secondary | ICD-10-CM

## 2019-12-05 NOTE — Telephone Encounter (Signed)
lmom x1 

## 2019-12-05 NOTE — Telephone Encounter (Signed)
ONO with RA 11/26/19 >> test time 5 hrs 56 min.  Baseline SpO2 81%, SpO2 low 70%.  Spent 5 hr 56 min with SpO2 < 88%.  Please let her know her oxygen level is low while asleep.  She needs to use 2 liters oxygen while asleep in addition to 2 liters oxygen during the day with exertion.

## 2019-12-05 NOTE — Telephone Encounter (Signed)
Patient is aware and already on o2 and will start wearin 2l of o2 at night. Nothing further is needed at this time.

## 2019-12-06 ENCOUNTER — Other Ambulatory Visit (HOSPITAL_COMMUNITY): Payer: Self-pay | Admitting: Adult Health Nurse Practitioner

## 2019-12-06 DIAGNOSIS — Z780 Asymptomatic menopausal state: Secondary | ICD-10-CM

## 2019-12-26 ENCOUNTER — Telehealth: Payer: Self-pay

## 2019-12-26 NOTE — Telephone Encounter (Signed)
LMOVM for pt to manually send missed ICM transmission.

## 2019-12-28 ENCOUNTER — Telehealth: Payer: Self-pay

## 2019-12-28 NOTE — Telephone Encounter (Signed)
I let the pt know the [facility] system did come back up and offered to help her with her missed transmission. She declined and states she would rather do it on Monday.

## 2019-12-28 NOTE — Telephone Encounter (Signed)
Pt was giving me a call back about the missed transmission. [facility] is down today. I told her I will give her a call on  Monday to help her with the transmission.

## 2019-12-31 ENCOUNTER — Telehealth: Payer: Self-pay | Admitting: Student

## 2019-12-31 NOTE — Telephone Encounter (Signed)
Patient states she is having difficulties sending her home remote transmission. She would like for someone from the office to help her

## 2020-01-01 ENCOUNTER — Encounter: Payer: Self-pay | Admitting: Pulmonary Disease

## 2020-01-01 ENCOUNTER — Ambulatory Visit (INDEPENDENT_AMBULATORY_CARE_PROVIDER_SITE_OTHER): Payer: Medicare HMO | Admitting: Pulmonary Disease

## 2020-01-01 ENCOUNTER — Other Ambulatory Visit: Payer: Self-pay

## 2020-01-01 VITALS — BP 160/92 | HR 74 | Temp 97.2°F | Ht 64.0 in | Wt 235.4 lb

## 2020-01-01 DIAGNOSIS — I5022 Chronic systolic (congestive) heart failure: Secondary | ICD-10-CM

## 2020-01-01 DIAGNOSIS — J4540 Moderate persistent asthma, uncomplicated: Secondary | ICD-10-CM | POA: Diagnosis not present

## 2020-01-01 DIAGNOSIS — J9611 Chronic respiratory failure with hypoxia: Secondary | ICD-10-CM

## 2020-01-01 NOTE — Progress Notes (Signed)
[facility]  Chief Complaint  Patient presents with  . Follow-up    dyspnea    Constitutional:  BP (!) 160/92 (BP Location: Left Arm, Cuff Size: Normal)   Pulse 74   Temp (!) 97.2 F (36.2 C) (Other (Comment)) Comment (Src): wrist  Ht 5\' 4"  (1.626 m)   Wt 235 lb 6.4 oz (106.8 kg)   SpO2 (!) 88% Comment: room air  BMI 40.41 kg/m   Past Medical History:  Systolic CHF with non ischemic CM, s/p AICD, DM, HTN, HLD, PUD  Summary:  Anna Beltran is a 69 y.o. female with dyspnea from asthma, systolic CHF, and chronic hypoxic respiratory failure.  Subjective:   CXR from 11/22/19 showed changes of CHF.  Overnight oximetry showed low oxygen.  She feels flovent has helped her breathing.  Not having cough, wheeze, or chest congestion.  She uses 2 liters oxygen at night.  Sleep is better.  She isn't aware of snoring, and doesn't think she has sleep apnea.  Only intermittently uses oxygen with exertion during the day.  Not having chest pain, leg swelling.   Physical Exam:   Appearance - well kempt   ENMT - no sinus tenderness, prominent turbinates, no oral exudate, no LAN, Mallampati 4 airway, no stridor  Respiratory - equal breath sounds bilaterally, no wheezing or rales  CV - s1s2 regular rate and rhythm, no murmurs  Ext - no clubbing, no edema  Skin - no rashes  Psych - normal mood and affect  Assessment/Plan:   Moderate persistent asthma. - continue flovent  Chronic respiratory failure with hypoxia. 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 - followed by Dr. 01/23/20 with cardiology  A total of 22 minutes spent addressing patient care issues on day of visit.  Follow up:  Patient Instructions  Follow up in 6 months   Signature:  Jens Som,  MD [facility] Pager: [phone] 01/01/2020, 10:23 AM  Flow Sheet     Pulmonary tests:   PFT 07/26/18 >> FEV1 1.14 (61%), FEV1% 79, TLC 4.19 (82%), RV:TLC 141, DLCO 67%, +BD from FEF 25-75  Sleep tests:   ONO with RA 11/26/19 >> test time 5 hrs 56 min.  Baseline SpO2 81%, SpO2 low 70%.  Spent 5 hr 56 min with SpO2 < 88%.  Cardiac tests:   Echo 02/15/19 >> EF 35 to 40%  Medications:   Allergies as of 01/01/2020      Reactions   Isosorbide Nitrate Other (See Comments)   Very bad headaches      Medication List       Accurate as of January 01, 2020 10:23 AM. If you have any questions, ask your nurse or doctor.        STOP taking these medications   spironolactone 25 MG tablet Commonly known as: ALDACTONE Stopped by: January 03, 2020, MD     TAKE these medications   amLODipine 10 MG tablet Commonly known as: NORVASC Take 1 tablet (10 mg total) by mouth daily.   aspirin 81 MG tablet Take 81 mg by mouth daily.   carvedilol 25 MG tablet Commonly known as: COREG TAKE 1 TABLET BY MOUTH TWICE DAILY WITH MEALS   Entresto 97-103 MG Generic drug: sacubitril-valsartan Take 1 tablet by mouth 2 (two) times daily.   Flovent  HFA 110 MCG/ACT inhaler Generic drug: fluticasone Inhale 2 puffs into the lungs 2 (two) times daily.   furosemide 20 MG tablet Commonly known as: LASIX Take 20 mg by mouth 2 (two) times daily as needed.   glipiZIDE 5 MG tablet Commonly known as: GLUCOTROL Take 5 mg by mouth 2 (two) times daily before a meal.   hydrALAZINE 100 MG tablet Commonly known as: APRESOLINE Take 1 tablet (100 mg total) by mouth 3 (three) times daily.   metFORMIN 1000 MG tablet Commonly known as: GLUCOPHAGE Take 1,000 mg by mouth 2 (two) times daily with a meal.   omeprazole 20 MG capsule Commonly known as: PRILOSEC Take 1 capsule (20 mg total) by mouth 2 (two) times daily.   REFRESH OP Place 1 drop into both eyes 2 (two) times daily as needed  (for dry eyes).       Past Surgical History:  She  has a past surgical history that includes none; Cardiac defibrillator placement (01/17/2014); left and right heart catheterization with coronary angiogram (N/A, 02/27/2013); implantable cardioverter defibrillator implant (N/A, 01/17/2014); and Excision of skin tag (N/A, 12/01/2018).  Family History:  Her family history includes Cancer in her brother; Diabetes Mellitus II in her mother; Hypertension in her father.  Social History:  She  reports that she has never smoked. She has never used smokeless tobacco. She reports that she does not drink alcohol and does not use drugs.

## 2020-01-01 NOTE — Telephone Encounter (Signed)
Pt did not call [employer] back. I told her when she have a chance today to give them a call so they can replace the monitor. The pt verbalized understanding.

## 2020-01-01 NOTE — Patient Instructions (Signed)
Follow up in 6 months 

## 2020-01-08 ENCOUNTER — Telehealth: Payer: Self-pay

## 2020-01-08 NOTE — Telephone Encounter (Signed)
Attempted call to patient and left voice mail message to return call if assistance is needed in sending a remote transmission.

## 2020-01-11 NOTE — Progress Notes (Signed)
No ICM remote transmission received for 12/31/2019 and next ICM transmission scheduled for 01/28/2020.

## 2020-01-14 NOTE — Telephone Encounter (Signed)
Patient received a new monitor. Called patient to send manual transmission to assure monitor is working.   Patient states she can send it later today. Advised patient we will keep a lookout for it. Advised patient if she does not hear back from [place] the transmission was successful, if we do not receive it we will call her back. Agrees to plan.

## 2020-01-15 ENCOUNTER — Ambulatory Visit (INDEPENDENT_AMBULATORY_CARE_PROVIDER_SITE_OTHER): Payer: Medicare HMO | Admitting: *Deleted

## 2020-01-15 DIAGNOSIS — I428 Other cardiomyopathies: Secondary | ICD-10-CM

## 2020-01-15 LAB — CUP PACEART REMOTE DEVICE CHECK
Battery Remaining Longevity: 60 mo
Battery Voltage: 2.98 V
Brady Statistic RV Percent Paced: 0.01 %
Date Time Interrogation Session: 20210831091905
HighPow Impedance: 67 Ohm
Implantable Lead Implant Date: 20150903
Implantable Lead Location: 753860
Implantable Lead Model: 6935
Implantable Pulse Generator Implant Date: 20150903
Lead Channel Impedance Value: 342 Ohm
Lead Channel Impedance Value: 399 Ohm
Lead Channel Pacing Threshold Amplitude: 1 V
Lead Channel Pacing Threshold Pulse Width: 0.4 ms
Lead Channel Sensing Intrinsic Amplitude: 15.875 mV
Lead Channel Sensing Intrinsic Amplitude: 15.875 mV
Lead Channel Setting Pacing Amplitude: 2 V
Lead Channel Setting Pacing Pulse Width: 0.4 ms
Lead Channel Setting Sensing Sensitivity: 0.3 mV

## 2020-01-16 NOTE — Progress Notes (Signed)
Remote ICD transmission.   

## 2020-01-16 NOTE — Telephone Encounter (Signed)
Transmission received from patient's new monitor.

## 2020-01-22 ENCOUNTER — Other Ambulatory Visit: Payer: Self-pay | Admitting: Cardiology

## 2020-01-22 DIAGNOSIS — I428 Other cardiomyopathies: Secondary | ICD-10-CM

## 2020-01-28 ENCOUNTER — Ambulatory Visit (INDEPENDENT_AMBULATORY_CARE_PROVIDER_SITE_OTHER): Payer: Medicare HMO

## 2020-01-28 DIAGNOSIS — I5022 Chronic systolic (congestive) heart failure: Secondary | ICD-10-CM | POA: Diagnosis not present

## 2020-01-28 DIAGNOSIS — Z95810 Presence of automatic (implantable) cardiac defibrillator: Secondary | ICD-10-CM | POA: Diagnosis not present

## 2020-01-30 ENCOUNTER — Telehealth: Payer: Self-pay

## 2020-01-30 NOTE — Telephone Encounter (Signed)
Remote ICM transmission received.  Attempted call to patient regarding ICM remote transmission and left detailed message per DPR.  Advised to return call for any fluid symptoms or questions. Next ICM remote transmission scheduled 03/03/2020.     

## 2020-01-30 NOTE — Progress Notes (Signed)
EPIC Encounter for ICM Monitoring  Patient Name: Anna Beltran is a 69 y.o. female Date: 01/30/2020 Primary Care Physican: Rebecka Apley, NP Primary Cardiologist:Crenshaw Electrophysiologist:Allred 8/17/2021Office Weight:235 lbs (236 lbs baseline)  Attempted call to patient and unable to reach.  Left detailed message per DPR regarding transmission. Transmission reviewed.   Optivol thoracic impedance normal.  Prescribed: Furosemide20 mg take 2 tablets (40 mg total) daily as needed.  Labs: 09/14/2019 Creatinine 1.06, BUN 20, Potassium 4.2, Sodium 145, GFR 53-61 A complete set of results can be found in Results Review.  Recommendations:Left voice mail with ICM number and encouraged to call if experiencing any fluid symptoms.  Follow-up plan: ICM clinic phone appointment on 03/03/2020.   91 day device clinic remote transmission 04/15/2020.    EP/Cardiology Office Visits: Recall 03/15/2020 with Dr. Jens Som.  Recall 09/08/2020 with Dr Johney Frame.    Copy of ICM check sent to Dr. Johney Frame.   3 month ICM trend: 01/28/2020    1 Year ICM trend:       [name], RN 01/30/2020 2:13 PM

## 2020-02-25 ENCOUNTER — Other Ambulatory Visit (HOSPITAL_COMMUNITY): Payer: Medicare HMO

## 2020-03-03 ENCOUNTER — Ambulatory Visit (INDEPENDENT_AMBULATORY_CARE_PROVIDER_SITE_OTHER): Payer: Medicare HMO

## 2020-03-03 DIAGNOSIS — I5022 Chronic systolic (congestive) heart failure: Secondary | ICD-10-CM | POA: Diagnosis not present

## 2020-03-03 DIAGNOSIS — Z95810 Presence of automatic (implantable) cardiac defibrillator: Secondary | ICD-10-CM

## 2020-03-05 NOTE — Progress Notes (Signed)
EPIC Encounter for ICM Monitoring  Patient Name: Anna Beltran is a 69 y.o. female Date: 03/05/2020 Primary Care Physican: Rebecka Apley, NP Primary Cardiologist:Crenshaw Electrophysiologist:Allred 8/17/2021Office Weight:235 lbs (236 lbsbaseline)  Transmission reviewed.   Optivol thoracic impedance normal.  Prescribed: Furosemide20 mg take 2 tablets (40 mg total) dailyas needed.  Labs: 09/14/2019 Creatinine 1.06, BUN 20, Potassium 4.2, Sodium 145, GFR 53-61 A complete set of results can be found in Results Review.  Recommendations:  No changes  Follow-up plan: ICM clinic phone appointment on 03/03/2020.   91 day device clinic remote transmission 04/15/2020.    EP/Cardiology Office Visits: Recall 03/15/2020 with Dr. Jens Som.  Recall 09/08/2020 with Dr Johney Frame.    Copy of ICM check sent to Dr. Johney Frame.   3 month ICM trend: 03/03/2020    1 Year ICM trend:       [name], RN 03/05/2020 9:52 AM

## 2020-03-25 ENCOUNTER — Other Ambulatory Visit: Payer: Self-pay | Admitting: Cardiology

## 2020-03-25 DIAGNOSIS — I2583 Coronary atherosclerosis due to lipid rich plaque: Secondary | ICD-10-CM

## 2020-03-25 DIAGNOSIS — I2510 Atherosclerotic heart disease of native coronary artery without angina pectoris: Secondary | ICD-10-CM

## 2020-04-07 ENCOUNTER — Ambulatory Visit (INDEPENDENT_AMBULATORY_CARE_PROVIDER_SITE_OTHER): Payer: Medicare HMO

## 2020-04-07 DIAGNOSIS — Z95810 Presence of automatic (implantable) cardiac defibrillator: Secondary | ICD-10-CM | POA: Diagnosis not present

## 2020-04-07 DIAGNOSIS — I5022 Chronic systolic (congestive) heart failure: Secondary | ICD-10-CM | POA: Diagnosis not present

## 2020-04-08 ENCOUNTER — Telehealth: Payer: Self-pay

## 2020-04-08 NOTE — Progress Notes (Signed)
EPIC Encounter for ICM Monitoring  Patient Name: Anna Beltran is a 69 y.o. female Date: 04/08/2020 Primary Care Physican: Rebecka Apley, NP Primary Cardiologist:Crenshaw Electrophysiologist:Allred 8/17/2021OfficeWeight:235lbs (236 lbsbaseline)  Attempted call to patient and unable to reach. Transmission reviewed.   Optivol thoracic impedance normal.  Prescribed: Furosemide20 mg take 2 tablets (40 mg total) dailyas needed.  Labs: 09/14/2019 Creatinine 1.06, BUN 20, Potassium 4.2, Sodium 145, GFR 53-61 A complete set of results can be found in Results Review.  Recommendations:  Unable to reach.    Follow-up plan: ICM clinic phone appointment on12/272021. 91 day device clinic remote transmission 04/15/2020.   EP/Cardiology Office Visits:Recall 03/15/2020 with Dr.Crenshaw. Recall 09/08/2020 with Dr Johney Frame.   Copy of ICM check sent to Dr.Allred  3 month ICM trend: 04/07/2020    1 Year ICM trend:       [name], RN 04/08/2020 2:01 PM

## 2020-04-08 NOTE — Telephone Encounter (Signed)
Remote ICM transmission received.  Attempted call to patient regarding ICM remote transmission and left message to return call   

## 2020-04-15 ENCOUNTER — Ambulatory Visit (INDEPENDENT_AMBULATORY_CARE_PROVIDER_SITE_OTHER): Payer: Medicare HMO

## 2020-04-15 DIAGNOSIS — I428 Other cardiomyopathies: Secondary | ICD-10-CM

## 2020-04-15 LAB — CUP PACEART REMOTE DEVICE CHECK
Battery Remaining Longevity: 58 mo
Battery Voltage: 2.98 V
Brady Statistic RV Percent Paced: 0.01 %
Date Time Interrogation Session: 20211130012503
HighPow Impedance: 67 Ohm
Implantable Lead Implant Date: 20150903
Implantable Lead Location: 753860
Implantable Lead Model: 6935
Implantable Pulse Generator Implant Date: 20150903
Lead Channel Impedance Value: 304 Ohm
Lead Channel Impedance Value: 399 Ohm
Lead Channel Pacing Threshold Amplitude: 1.125 V
Lead Channel Pacing Threshold Pulse Width: 0.4 ms
Lead Channel Sensing Intrinsic Amplitude: 17 mV
Lead Channel Sensing Intrinsic Amplitude: 17 mV
Lead Channel Setting Pacing Amplitude: 2.25 V
Lead Channel Setting Pacing Pulse Width: 0.4 ms
Lead Channel Setting Sensing Sensitivity: 0.3 mV

## 2020-04-22 NOTE — Progress Notes (Signed)
Remote ICD transmission.   

## 2020-05-12 ENCOUNTER — Ambulatory Visit (INDEPENDENT_AMBULATORY_CARE_PROVIDER_SITE_OTHER): Payer: Medicare HMO

## 2020-05-12 DIAGNOSIS — I5022 Chronic systolic (congestive) heart failure: Secondary | ICD-10-CM | POA: Diagnosis not present

## 2020-05-12 DIAGNOSIS — Z95810 Presence of automatic (implantable) cardiac defibrillator: Secondary | ICD-10-CM | POA: Diagnosis not present

## 2020-05-12 NOTE — Progress Notes (Signed)
EPIC Encounter for ICM Monitoring  Patient Name: Anna Beltran is a 69 y.o. female Date: 05/12/2020 Primary Care Physican: Rebecka Apley, NP Primary Cardiologist:Crenshaw Electrophysiologist:Allred 8/17/2021OfficeWeight:235lbs (236 lbsbaseline)  Transmission reviewed.   Optivol thoracic impedance normal.  Prescribed: Furosemide20 mg take 2 tablets (40 mg total) dailyas needed.  Labs: 09/14/2019 Creatinine 1.06, BUN 20, Potassium 4.2, Sodium 145, GFR 53-61 A complete set of results can be found in Results Review.  Recommendations:No changes.    Follow-up plan: ICM clinic phone appointment on2/05/2020. 91 day device clinic remote transmission 07/15/2020.   EP/Cardiology Office Visits:Recall 03/15/2020 with Dr.Crenshaw. Recall 09/08/2020 with Dr Johney Frame.   Copy of ICM check sent to Dr.Allred.  3 month ICM trend: 05/12/2020    1 Year ICM trend:       [name], RN 05/12/2020 4:46 PM

## 2020-06-17 ENCOUNTER — Ambulatory Visit (INDEPENDENT_AMBULATORY_CARE_PROVIDER_SITE_OTHER): Payer: Medicare HMO

## 2020-06-17 DIAGNOSIS — Z95810 Presence of automatic (implantable) cardiac defibrillator: Secondary | ICD-10-CM | POA: Diagnosis not present

## 2020-06-17 DIAGNOSIS — I5022 Chronic systolic (congestive) heart failure: Secondary | ICD-10-CM

## 2020-06-20 NOTE — Progress Notes (Signed)
EPIC Encounter for ICM Monitoring  Patient Name: Anna Beltran is a 70 y.o. female Date: 06/20/2020 Primary Care Physican: Rebecka Apley, NP Primary Cardiologist:Crenshaw Electrophysiologist:Allred 06/20/2020 Weight:235lbs (236 lbsbaseline)  Spoke with patient and reports feeling well at this time.  Denies fluid symptoms.    Optivol thoracic impedance normal.  Prescribed: Furosemide20 mg take 2 tablets (40 mg total) dailyas needed.   Labs: 09/14/2019 Creatinine 1.06, BUN 20, Potassium 4.2, Sodium 145, GFR 53-61 A complete set of results can be found in Results Review.  Recommendations:No changes and encouraged to call if experiencing any fluid symptoms.  Follow-up plan: ICM clinic phone appointment on3/11/2020. 91 day device clinic remote transmission 07/15/2020.   EP/Cardiology Office Visits:08/04/2020 with Dr.Crenshaw. Recall 09/08/2020 with Dr Johney Frame.   Copy of ICM check sent to Dr.Allred.  3 month ICM trend: 06/17/2020.    1 Year ICM trend:       Anna Soda, RN 06/20/2020 5:08 PM

## 2020-07-15 ENCOUNTER — Ambulatory Visit (INDEPENDENT_AMBULATORY_CARE_PROVIDER_SITE_OTHER): Payer: Medicare HMO

## 2020-07-15 DIAGNOSIS — I420 Dilated cardiomyopathy: Secondary | ICD-10-CM | POA: Diagnosis not present

## 2020-07-15 LAB — CUP PACEART REMOTE DEVICE CHECK
Battery Remaining Longevity: 54 mo
Battery Voltage: 2.97 V
Brady Statistic RV Percent Paced: 0.01 %
Date Time Interrogation Session: 20220301074233
HighPow Impedance: 70 Ohm
Implantable Lead Implant Date: 20150903
Implantable Lead Location: 753860
Implantable Lead Model: 6935
Implantable Pulse Generator Implant Date: 20150903
Lead Channel Impedance Value: 304 Ohm
Lead Channel Impedance Value: 399 Ohm
Lead Channel Pacing Threshold Amplitude: 0.875 V
Lead Channel Pacing Threshold Pulse Width: 0.4 ms
Lead Channel Sensing Intrinsic Amplitude: 16.875 mV
Lead Channel Sensing Intrinsic Amplitude: 16.875 mV
Lead Channel Setting Pacing Amplitude: 2 V
Lead Channel Setting Pacing Pulse Width: 0.4 ms
Lead Channel Setting Sensing Sensitivity: 0.3 mV

## 2020-07-21 ENCOUNTER — Ambulatory Visit (INDEPENDENT_AMBULATORY_CARE_PROVIDER_SITE_OTHER): Payer: Medicare HMO

## 2020-07-21 DIAGNOSIS — I5022 Chronic systolic (congestive) heart failure: Secondary | ICD-10-CM

## 2020-07-21 DIAGNOSIS — Z95810 Presence of automatic (implantable) cardiac defibrillator: Secondary | ICD-10-CM

## 2020-07-23 NOTE — Progress Notes (Signed)
Remote ICD transmission.   

## 2020-07-23 NOTE — Progress Notes (Signed)
EPIC Encounter for ICM Monitoring  Patient Name: Anna Beltran is a 70 y.o. female Date: 07/23/2020 Primary Care Physican: Rebecka Apley, NP Primary Cardiologist:Crenshaw Electrophysiologist:Allred 06/20/2020 Weight:235lbs (236 lbsbaseline)  Spoke with patient and reports feeling well at this time.  Denies fluid symptoms.    Optivol thoracic impedance normal.  Prescribed: Furosemide20 mg take 2 tablets (40 mg total) dailyas needed.   Labs: 09/14/2019 Creatinine 1.06, BUN 20, Potassium 4.2, Sodium 145, GFR 53-61 A complete set of results can be found in Results Review.  Recommendations:No changes and encouraged to call if experiencing any fluid symptoms.  Follow-up plan: ICM clinic phone appointment on4/03/2021. 91 day device clinic remote transmission5/31/2022.   EP/Cardiology Office Visits:08/04/2020 with Dr.Crenshaw. Recall 09/08/2020 with Dr Johney Frame.   Copy of ICM check sent to Dr.Allred.  3 month ICM trend: 07/21/2020.    1 Year ICM trend:       Anna Soda, RN 07/23/2020 4:37 PM

## 2020-07-30 ENCOUNTER — Other Ambulatory Visit: Payer: Self-pay | Admitting: Cardiology

## 2020-07-30 DIAGNOSIS — I2510 Atherosclerotic heart disease of native coronary artery without angina pectoris: Secondary | ICD-10-CM

## 2020-07-30 DIAGNOSIS — I428 Other cardiomyopathies: Secondary | ICD-10-CM

## 2020-07-30 DIAGNOSIS — I2583 Coronary atherosclerosis due to lipid rich plaque: Secondary | ICD-10-CM

## 2020-08-02 NOTE — Progress Notes (Signed)
HPI: FU congestive heart failure/cardiomyopathy. Previously followed in [facility]. Carotid Dopplers in April 2014 showed less than 39% left carotid stenosis. Chest CT in March of 2014 showed no pulmonary embolus, small to moderate right effusion which was felt to be partially loculated. Cardiac catheterization in October of 2014 showed nonobstructive coronary disease and an ejection fraction of 30%. Right heart pressures were normal. Apparently patient has had poor compliance with treatment and poor tolerance of treatment based on outside records. ICD placed 9/15.Echocardiogram October 2020 showed ejection fraction 35 to 40%, severe left ventricular enlargement, moderate right atrial enlargement. Since she was last seen, on exertion.  Occasional orthopnea.  Minimal pedal edema.  No chest pain or syncope.  Current Outpatient Medications  Medication Sig Dispense Refill  . amLODipine (NORVASC) 5 MG tablet Take 1 tablet by mouth once daily 90 tablet 0  . aspirin 81 MG tablet Take 81 mg by mouth daily.    . carvedilol (COREG) 25 MG tablet TAKE 1 TABLET BY MOUTH TWICE DAILY WITH MEALS 180 tablet 0  . ENTRESTO 97-103 MG Take 1 tablet by mouth twice daily 60 tablet 0  . fluticasone (FLOVENT HFA) 110 MCG/ACT inhaler Inhale 2 puffs into the lungs 2 (two) times daily. 1 Inhaler 12  . furosemide (LASIX) 20 MG tablet Take 20 mg by mouth 2 (two) times daily as needed.    Marland Kitchen glipiZIDE (GLUCOTROL) 5 MG tablet Take 5 mg by mouth 2 (two) times daily before a meal.     . hydrALAZINE (APRESOLINE) 100 MG tablet TAKE 1 TABLET BY MOUTH THREE TIMES DAILY 270 tablet 0  . metFORMIN (GLUCOPHAGE) 1000 MG tablet Take 1,000 mg by mouth 2 (two) times daily with a meal.     . omeprazole (PRILOSEC) 20 MG capsule Take 1 capsule (20 mg total) by mouth 2 (two) times daily. 180 capsule 1  . Polyvinyl Alcohol-Povidone (REFRESH OP) Place 1 drop into both eyes 2 (two) times daily as needed (for dry eyes).      No current  facility-administered medications for this visit.     Past Medical History:  Diagnosis Date  . Automatic implantable cardioverter-defibrillator in situ 01/17/2014   BY DR ALLRED  . Bruit    Bilat carotid duplex 09/05/12 CONCLUSION: Mild, less than or equal to 39%, left internal carotid artery stenosis.  . CHF (congestive heart failure) (HCC)    MPI 09/05/12 NORMAL, showed no scar & LVEF is 20-25%. ECHO 12/11/12 LV is markedly dilated, Overall LV systolic function severely impaired with EF = 25-30%, pseudonormal LV filling pattern (consistant with elevated LA pressure).  . Diabetes mellitus, type 2 (HCC)    TYPE 2  . Essential hypertension, benign   . Hyperlipidemia, mixed   . Hypersomnia, unspecified   . Hypertensive cardiomyopathy (HCC)    By report there is poor compliance w/treatment, poor tolerance of treatment & poor symptom control. Symptoms include dyspnea & DOE.  Marland Kitchen Obesity   . PUD (peptic ulcer disease)   . Unspecified menopausal and postmenopausal disorder     Past Surgical History:  Procedure Laterality Date  . CARDIAC DEFIBRILLATOR PLACEMENT  01/17/2014   DR Johney Frame  . EXCISION OF SKIN TAG N/A 12/01/2018   Procedure: EXCISION OF SKIN TAG ANAL;  Surgeon: Romie Levee, MD;  Location: [facility];  Service: General;  Laterality: N/A;  . IMPLANTABLE CARDIOVERTER DEFIBRILLATOR IMPLANT N/A 01/17/2014   Procedure: IMPLANTABLE CARDIOVERTER DEFIBRILLATOR IMPLANT;  Surgeon: Gardiner Rhyme, MD;  [facility]  C[facility] LAB;  Service: Cardiovascular;  Laterality: N/A;  . LEFT AND RIGHT HEART CATHETERIZATION WITH CORONARY ANGIOGRAM N/A 02/27/2013   Procedure: LEFT AND RIGHT HEART CATHETERIZATION WITH CORONARY ANGIOGRAM;  Surgeon: Rollene Rotunda, MD;  Location: [facility] CATH LAB;  Service: Cardiovascular;  Laterality: N/A;  . none      Social History   Socioeconomic History  . Marital status: Married    Spouse name: Not on file  . Number of children: 5  . Years of education: Not on file  . Highest  education level: Not on file  Occupational History    Comment: Retired  Tobacco Use  . Smoking status: Never Smoker  . Smokeless tobacco: Never Used  Vaping Use  . Vaping Use: Never used  Substance and Sexual Activity  . Alcohol use: No  . Drug use: No  . Sexual activity: Never  Other Topics Concern  . Not on file  Social History Narrative   Lives in [place] with husband and son.  Retired from [employer] Strain: Not on [employer] Insecurity: Not on file  Transportation Needs: Not on file  Physical Activity: Not on file  Stress: Not on file  Social Connections: Not on file  Intimate Partner Violence: Not on file    Family History  Problem Relation Age of Onset  . Diabetes Mellitus II Mother   . Cancer Brother        Colon  . Hypertension Father     ROS: no fevers or chills, productive cough, hemoptysis, dysphasia, odynophagia, melena, hematochezia, dysuria, hematuria, rash, seizure activity, orthopnea, PND, claudication. Remaining systems are negative.  Physical Exam: Well-developed well-nourished in no acute distress.  Skin is warm and dry.  HEENT is normal.  Neck is supple.  Chest is clear to auscultation with normal expansion.  Cardiovascular exam is regular rate and rhythm.  Abdominal exam nontender or distended. No masses palpated. Extremities show trace edema. neuro grossly intact  ECG-normal sinus rhythm at a rate of 81, nonspecific ST changes.  Personally reviewed  A/P  1 nonischemic cardiomyopathy-plan to continue Entresto and beta-blocker at present dose.  2 chronic systolic congestive heart failure-she appears to be euvolemic today.  Continue diuretics at present dose.  Check potassium and renal function.  3 hypertension-blood pressure controlled.  Continue present medications and follow-up.  4 hyperlipidemia-resume Crestor 20 mg daily.  Check lipids and liver in 12 weeks.  5 coronary artery  disease-she has not had recurrent chest pain.  Plan to continue medical therapy.  Continue aspirin.  Resume statin.  6 previous ICD-Per EP.  7 morbid obesity-we discussed weight loss.  Olga Millers, MD

## 2020-08-04 ENCOUNTER — Encounter: Payer: Self-pay | Admitting: Cardiology

## 2020-08-04 ENCOUNTER — Ambulatory Visit: Payer: Medicare HMO | Admitting: Cardiology

## 2020-08-04 ENCOUNTER — Other Ambulatory Visit: Payer: Self-pay

## 2020-08-04 VITALS — BP 130/68 | HR 81 | Ht 64.0 in | Wt 236.8 lb

## 2020-08-04 DIAGNOSIS — I5022 Chronic systolic (congestive) heart failure: Secondary | ICD-10-CM | POA: Diagnosis not present

## 2020-08-04 DIAGNOSIS — I420 Dilated cardiomyopathy: Secondary | ICD-10-CM

## 2020-08-04 DIAGNOSIS — I2583 Coronary atherosclerosis due to lipid rich plaque: Secondary | ICD-10-CM

## 2020-08-04 DIAGNOSIS — I10 Essential (primary) hypertension: Secondary | ICD-10-CM

## 2020-08-04 DIAGNOSIS — E7800 Pure hypercholesterolemia, unspecified: Secondary | ICD-10-CM | POA: Diagnosis not present

## 2020-08-04 DIAGNOSIS — I2510 Atherosclerotic heart disease of native coronary artery without angina pectoris: Secondary | ICD-10-CM | POA: Diagnosis not present

## 2020-08-04 DIAGNOSIS — I428 Other cardiomyopathies: Secondary | ICD-10-CM

## 2020-08-04 MED ORDER — ROSUVASTATIN CALCIUM 20 MG PO TABS: 20.0000 mg | ORAL_TABLET | Freq: Every day | ORAL | 3 refills | Status: DC

## 2020-08-04 NOTE — Patient Instructions (Signed)
Medication Instructions:   START ROSUVASTATIN 20 MG ONCE DAILY  *If you need a refill on your cardiac medications before your next appointment, please call your pharmacy*   Lab Work:  Your physician recommends that you return for lab work in: 12 WEEKS-FASTING  If you have labs (blood work) drawn today and your tests are completely normal, you will receive your results only by: MyChart Message (if you have MyChart) OR A paper copy in the mail If you have any lab test that is abnormal or we need to change your treatment, we will call you to review the results.   Follow-Up: At [facility], you and your health needs are our priority.  As part of our continuing mission to provide you with exceptional heart care, we have created designated Provider Care Teams.  These Care Teams include your primary Cardiologist (physician) and Advanced Practice Providers (APPs -  Physician Assistants and Nurse Practitioners) who all work together to provide you with the care you need, when you need it.  We recommend signing up for the patient portal called "MyChart".  Sign up information is provided on this After Visit Summary.  MyChart is used to connect with patients for Virtual Visits (Telemedicine).  Patients are able to view lab/test results, encounter notes, upcoming appointments, etc.  Non-urgent messages can be sent to your provider as well.   To learn more about what you can do with MyChart, go to [email or web address].    Your next appointment:   6 month(s)  The format for your next appointment:   In Person  Provider:   Brian Crenshaw, MD    

## 2020-08-05 LAB — LIPID PANEL
Chol/HDL Ratio: 4.3 ratio (ref 0.0–4.4)
Cholesterol, Total: 186 mg/dL (ref 100–199)
HDL: 43 mg/dL (ref >39)
LDL Chol Calc (NIH): 113 mg/dL — ABNORMAL HIGH (ref 0–99)
Triglycerides: 173 mg/dL — ABNORMAL HIGH (ref 0–149)
VLDL Cholesterol Cal: 30 mg/dL (ref 5–40)

## 2020-08-05 LAB — HEPATIC FUNCTION PANEL
ALT: 14 IU/L (ref 0–32)
AST: 5 IU/L (ref 0–40)
Albumin: 4.5 g/dL (ref 3.7–4.7)
Alkaline Phosphatase: 83 IU/L (ref 44–121)
Bilirubin Total: 0.5 mg/dL (ref 0.0–1.2)
Bilirubin, Direct: 0.15 mg/dL (ref 0.00–0.40)
Total Protein: 7.9 g/dL (ref 6.0–8.5)

## 2020-08-22 ENCOUNTER — Encounter: Payer: Self-pay | Admitting: *Deleted

## 2020-08-22 ENCOUNTER — Other Ambulatory Visit: Payer: Self-pay | Admitting: *Deleted

## 2020-08-22 DIAGNOSIS — E7800 Pure hypercholesterolemia, unspecified: Secondary | ICD-10-CM

## 2020-08-25 ENCOUNTER — Ambulatory Visit (INDEPENDENT_AMBULATORY_CARE_PROVIDER_SITE_OTHER): Payer: Medicare HMO

## 2020-08-25 DIAGNOSIS — Z95810 Presence of automatic (implantable) cardiac defibrillator: Secondary | ICD-10-CM | POA: Diagnosis not present

## 2020-08-25 DIAGNOSIS — I5022 Chronic systolic (congestive) heart failure: Secondary | ICD-10-CM

## 2020-08-27 ENCOUNTER — Telehealth: Payer: Self-pay

## 2020-08-27 NOTE — Telephone Encounter (Signed)
Remote ICM transmission received.  Attempted call to patient regarding ICM remote transmission and left message per DPR to return call.   

## 2020-08-27 NOTE — Progress Notes (Signed)
EPIC Encounter for ICM Monitoring  Patient Name: Anna Beltran is a 70 y.o. female Date: 08/27/2020 Primary Care Physican: Rebecka Apley, NP Primary Cardiologist:Crenshaw Electrophysiologist:Allred 2/4/2022Weight:235lbs (236 lbsbaseline)  Attempted call to patient and unable to reach.  Left message to return call. Transmission reviewed. Marland Kitchen   Optivol thoracic impedance normal.  Prescribed: Furosemide20 mg take 2 tablets (40 mg total) dailyas needed.   Labs: 09/14/2019 Creatinine 1.06, BUN 20, Potassium 4.2, Sodium 145, GFR 53-61 A complete set of results can be found in Results Review.  Recommendations:Unable to reach.    Follow-up plan: ICM clinic phone appointment on6/05/2020. 91 day device clinic remote transmission5/31/2022.   EP/Cardiology Office Visits:3/21/2022with Dr.Crenshaw. Recall 09/08/2020 with Dr Johney Frame.   Copy of ICM check sent to Dr.Allred.  3 month ICM trend: 08/25/2020.    1 Year ICM trend:       [name], RN 08/27/2020 12:43 PM

## 2020-10-09 ENCOUNTER — Other Ambulatory Visit: Payer: Self-pay | Admitting: Cardiology

## 2020-10-09 DIAGNOSIS — I428 Other cardiomyopathies: Secondary | ICD-10-CM

## 2020-10-14 ENCOUNTER — Ambulatory Visit (INDEPENDENT_AMBULATORY_CARE_PROVIDER_SITE_OTHER): Payer: Medicare HMO

## 2020-10-14 DIAGNOSIS — I428 Other cardiomyopathies: Secondary | ICD-10-CM | POA: Diagnosis not present

## 2020-10-14 LAB — CUP PACEART REMOTE DEVICE CHECK
Battery Remaining Longevity: 47 mo
Battery Voltage: 2.98 V
Brady Statistic RV Percent Paced: 0.01 %
Date Time Interrogation Session: 20220531002205
HighPow Impedance: 66 Ohm
Implantable Lead Implant Date: 20150903
Implantable Lead Location: 753860
Implantable Lead Model: 6935
Implantable Pulse Generator Implant Date: 20150903
Lead Channel Impedance Value: 342 Ohm
Lead Channel Impedance Value: 399 Ohm
Lead Channel Pacing Threshold Amplitude: 1 V
Lead Channel Pacing Threshold Pulse Width: 0.4 ms
Lead Channel Sensing Intrinsic Amplitude: 16.625 mV
Lead Channel Sensing Intrinsic Amplitude: 16.625 mV
Lead Channel Setting Pacing Amplitude: 2 V
Lead Channel Setting Pacing Pulse Width: 0.4 ms
Lead Channel Setting Sensing Sensitivity: 0.3 mV

## 2020-10-15 ENCOUNTER — Ambulatory Visit (INDEPENDENT_AMBULATORY_CARE_PROVIDER_SITE_OTHER): Payer: Medicare HMO

## 2020-10-15 DIAGNOSIS — Z95810 Presence of automatic (implantable) cardiac defibrillator: Secondary | ICD-10-CM

## 2020-10-15 DIAGNOSIS — I5022 Chronic systolic (congestive) heart failure: Secondary | ICD-10-CM | POA: Diagnosis not present

## 2020-10-17 NOTE — Progress Notes (Signed)
EPIC Encounter for ICM Monitoring  Patient Name: Anna Beltran is a 70 y.o. female Date: 10/17/2020 Primary Care Physican: Rebecka Apley, NP Primary Cardiologist:Crenshaw Electrophysiologist:Allred 6/3/2022Weight:235lbs (236 lbsbaseline)  Spoke with patient and reports feeling well at this time. Heart failure questions reviewed. Pt asymptomatic for fluid.  Optivol thoracic impedance normal.  Prescribed: Furosemide20 mg take 2 tablets (40 mg total) dailyas needed.   Labs: 09/14/2019 Creatinine 1.06, BUN 20, Potassium 4.2, Sodium 145, GFR 53-61 A complete set of results can be found in Results Review.  Recommendations:No changes and encouraged to call if experiencing any fluid symptoms.   Follow-up plan: ICM clinic phone appointment on7/09/2020. 91 day device clinic remote transmission8/30/2022.   EP/Cardiology Office Visits:9/28/2022with Dr.Crenshaw. Recall 09/08/2020 with Dr Johney Frame.   Copy of ICM check sent to Dr.Allred.  3 month ICM trend: 10/14/2020.    1 Year ICM trend:       [name], RN 10/17/2020 10:31 AM

## 2020-10-21 ENCOUNTER — Other Ambulatory Visit: Payer: Self-pay

## 2020-10-21 ENCOUNTER — Ambulatory Visit: Payer: Medicare HMO | Admitting: Pulmonary Disease

## 2020-10-21 ENCOUNTER — Encounter: Payer: Self-pay | Admitting: Pulmonary Disease

## 2020-10-21 VITALS — BP 162/82 | HR 76 | Temp 97.3°F | Ht 64.0 in | Wt 237.4 lb

## 2020-10-21 DIAGNOSIS — J4520 Mild intermittent asthma, uncomplicated: Secondary | ICD-10-CM | POA: Diagnosis not present

## 2020-10-21 DIAGNOSIS — J9611 Chronic respiratory failure with hypoxia: Secondary | ICD-10-CM

## 2020-10-21 DIAGNOSIS — I5022 Chronic systolic (congestive) heart failure: Secondary | ICD-10-CM | POA: Diagnosis not present

## 2020-10-21 NOTE — Patient Instructions (Addendum)
Call if you develop a cough or chest congestion and we can refill flovent inhaler  Will have [facility] and [employer] arrange for a portable oxygen concentrator  Follow up in 1 year

## 2020-10-21 NOTE — Progress Notes (Signed)
[facility]  Chief Complaint  Patient presents with  . Follow-up    NO complaints currently    Constitutional:  BP (!) 162/82 (BP Location: Left Arm, Cuff Size: Normal)   Pulse 76   Temp (!) 97.3 F (36.3 C) (Temporal)   Ht 5\' 4"  (1.626 m)   Wt 237 lb 6.4 oz (107.7 kg)   SpO2 (!) 88% Comment: Room air, pt refusing O2 at this time  BMI 40.75 kg/m   Past Medical History:  Systolic CHF with non ischemic CM, s/p AICD, DM, HTN, HLD, PUD  Past Surgical History:  She  has a past surgical history that includes none; Cardiac defibrillator placement (01/17/2014); left and right heart catheterization with coronary angiogram (N/A, 02/27/2013); implantable cardioverter defibrillator implant (N/A, 01/17/2014); and Excision of skin tag (N/A, 12/01/2018).  Brief Summary:  Anna Beltran is a 70 y.o. female with dyspnea from asthma, systolic CHF, and chronic hypoxic respiratory failure.      Subjective:   She ran out of flovent several months ago.  Not having cough or wheeze anymore, and didn't feel like she needed an inhaler.  Uses oxygen at night and during the day with activity.  It is cumbersome for her to carry around oxygen tanks and she wants to get a POC.  SpO2 on room air at rest today was 87%.  She was recovered with 3 liters supplemental oxygen to keep SpO2 > 91%.  Physical Exam:   Appearance - well kempt   ENMT - no sinus tenderness, no oral exudate, no LAN, Mallampati 3 airway, no stridor  Respiratory - equal breath sounds bilaterally, no wheezing or rales  CV - s1s2 regular rate and rhythm, no murmurs  Ext - no clubbing, no edema  Skin - no rashes  Psych - normal mood and affect   Pulmonary testing:   PFT 07/26/18 >> FEV1 1.14 (61%), FEV1% 79, TLC 4.19 (82%), RV:TLC 141, DLCO 67%, +BD from FEF 25-75  Sleep Tests:   ONO with RA 11/26/19 >>test time 5 hrs 56 min. Baseline SpO2 81%, SpO2 low 70%. Spent 5 hr 56 min with  SpO2 <88%.  Cardiac Tests:   Echo 02/15/19 >> EF 35 to 40%  Social History:  She  reports that she has never smoked. She has never used smokeless tobacco. She reports that she does not drink alcohol and does not use drugs.  Family History:  Her family history includes Cancer in her brother; Diabetes Mellitus II in her mother; Hypertension in her father.     Assessment/Plan:   Mild, intermittent asthma. - not having symptoms - she would prefer to call and get refill on inhalers (flovent, albuterol) if her symptoms progress  Chronic respiratory failure with hypoxia. - goal SpO2 > 90% - continue 2 to 3 liters with exertion and at night - will have [facility] arrange for a POC  Chronic systolic CHF from non ischemic cardiomyopathy. - followed by Dr. 04/17/19 with cardiology  Time Spent Involved in Patient Care on Day of Examination:  32 minutes  Follow up:  Patient Instructions  Call if you develop a cough or chest congestion and we can refill flovent inhaler  Will have [facility] [employer] arrange for a portable oxygen concentrator  Follow up in 1 year   Medication List:   Allergies as of 10/21/2020      Reactions   Isosorbide Nitrate Other (See Comments)   Very bad headaches  Medication List       Accurate as of October 21, 2020 12:40 PM. If you have any questions, ask your nurse or doctor.        STOP taking these medications   Flovent HFA 110 MCG/ACT inhaler Generic drug: fluticasone Stopped by: Coralyn Helling, MD     TAKE these medications   amLODipine 5 MG tablet Commonly known as: NORVASC Take 1 tablet by mouth once daily   aspirin 81 MG tablet Take 81 mg by mouth daily.   carvedilol 25 MG tablet Commonly known as: COREG TAKE 1 TABLET BY MOUTH TWICE DAILY WITH MEALS   Entresto 97-103 MG Generic drug: sacubitril-valsartan Take 1 tablet by mouth twice daily   furosemide 20 MG tablet Commonly known as: LASIX Take 20 mg by mouth 2  (two) times daily as needed.   glipiZIDE 5 MG tablet Commonly known as: GLUCOTROL Take 5 mg by mouth 2 (two) times daily before a meal.   hydrALAZINE 100 MG tablet Commonly known as: APRESOLINE TAKE 1 TABLET BY MOUTH THREE TIMES DAILY   metFORMIN 1000 MG tablet Commonly known as: GLUCOPHAGE Take 1,000 mg by mouth 2 (two) times daily with a meal.   omeprazole 20 MG capsule Commonly known as: PRILOSEC Take 1 capsule (20 mg total) by mouth 2 (two) times daily.   REFRESH OP Place 1 drop into both eyes 2 (two) times daily as needed (for dry eyes).   rosuvastatin 20 MG tablet Commonly known as: CRESTOR Take 1 tablet (20 mg total) by mouth daily.       Signature:  Coralyn Helling, MD [facility] Pager - [phone] 10/21/2020, 12:40 PM

## 2020-11-06 NOTE — Progress Notes (Signed)
Remote ICD transmission.   

## 2020-11-18 ENCOUNTER — Ambulatory Visit (INDEPENDENT_AMBULATORY_CARE_PROVIDER_SITE_OTHER): Payer: Medicare HMO

## 2020-11-18 DIAGNOSIS — Z95810 Presence of automatic (implantable) cardiac defibrillator: Secondary | ICD-10-CM | POA: Diagnosis not present

## 2020-11-18 DIAGNOSIS — I5022 Chronic systolic (congestive) heart failure: Secondary | ICD-10-CM

## 2020-11-19 ENCOUNTER — Telehealth: Payer: Self-pay

## 2020-11-19 NOTE — Progress Notes (Signed)
EPIC Encounter for ICM Monitoring  Patient Name: Anna Beltran is a 70 y.o. female Date: 11/19/2020 Primary Care Physican: Rebecka Apley, NP Primary Cardiologist: Jens Som Electrophysiologist: Allred 10/17/2020 Weight: 235 lbs (236 lbs baseline)                                               Attempted call to patient and unable to reach.  Left detailed message per DPR regarding transmission. Transmission reviewed.    Optivol thoracic impedance normal.   Prescribed: Furosemide 20 mg take 2 tablets (40 mg total) daily as needed.   Labs: 09/14/2019 Creatinine 1.06, BUN 20, Potassium 4.2, Sodium 145, GFR 53-61 A complete set of results can be found in Results Review.   Recommendations:   Left voice mail with ICM number and encouraged to call if experiencing any fluid symptoms.   Follow-up plan: ICM clinic phone appointment on 12/22/2020.   91 day device clinic remote transmission 01/13/2021.     EP/Cardiology Office Visits: 02/11/2021 with Dr. Jens Som.  Recall 09/08/2020 with Dr Johney Frame.     Copy of ICM check sent to Dr. Johney Frame.   3 month ICM trend: 11/19/2020.    1 Year ICM trend:       [name], RN 11/19/2020 8:15 AM

## 2020-11-19 NOTE — Telephone Encounter (Signed)
Remote ICM transmission received.  Attempted call to patient regarding ICM remote transmission and left detailed message per DPR.  Advised to return call for any fluid symptoms or questions. Next ICM remote transmission scheduled 12/22/2020.    

## 2020-12-01 ENCOUNTER — Encounter: Payer: Self-pay | Admitting: *Deleted

## 2020-12-09 ENCOUNTER — Telehealth: Payer: Self-pay | Admitting: Cardiology

## 2020-12-09 NOTE — Telephone Encounter (Signed)
Patient returning call.

## 2020-12-09 NOTE — Telephone Encounter (Signed)
Called patient to discuss letter.  LVM to call back, left call back number.

## 2020-12-09 NOTE — Telephone Encounter (Signed)
Asking to speak with the nurse in regards to the letter she got in the mail. Please advise

## 2020-12-10 NOTE — Telephone Encounter (Signed)
Called patient back- she states that she has not started the medication yet, just because she states she was nervous with starting a new one, but now she does not know about the blood work. I advised that they would want her to start the medication, and then do the blood work but would route to MD as an Lorain Childes that she had not started the medication, and see if he recommended anything else. Patient verbalized understanding.

## 2020-12-10 NOTE — Telephone Encounter (Signed)
Spoke with pt, aware she does not have to take the rosuvastatin if she does not want to. She reports she does not want to take it. Explained to patient she does not need to do lab work if not taking the medicine. Aware of follow up appointment.

## 2020-12-16 ENCOUNTER — Other Ambulatory Visit: Payer: Self-pay | Admitting: Cardiology

## 2020-12-16 DIAGNOSIS — I428 Other cardiomyopathies: Secondary | ICD-10-CM

## 2020-12-22 ENCOUNTER — Ambulatory Visit (INDEPENDENT_AMBULATORY_CARE_PROVIDER_SITE_OTHER): Payer: Medicare HMO

## 2020-12-22 DIAGNOSIS — Z95810 Presence of automatic (implantable) cardiac defibrillator: Secondary | ICD-10-CM

## 2020-12-22 DIAGNOSIS — I5022 Chronic systolic (congestive) heart failure: Secondary | ICD-10-CM

## 2020-12-23 NOTE — Progress Notes (Signed)
EPIC Encounter for ICM Monitoring  Patient Name: Anna Beltran is a 70 y.o. female Date: 12/23/2020 Primary Care Physican: Rebecka Apley, NP Primary Cardiologist: Jens Som Electrophysiologist: Allred 10/17/2020 Weight: 235 lbs (236 lbs baseline)                                               Transmission reviewed.    Optivol thoracic impedance normal.   Prescribed: Furosemide 20 mg take 2 tablets (40 mg total) daily as needed.   Labs: 09/14/2019 Creatinine 1.06, BUN 20, Potassium 4.2, Sodium 145, GFR 53-61 A complete set of results can be found in Results Review.   Recommendations: No changes.   Follow-up plan: ICM clinic phone appointment on 01/26/2021.   91 day device clinic remote transmission 01/13/2021.     EP/Cardiology Office Visits: 02/11/2021 with Dr. Jens Som.  Recall 09/08/2020 with Dr Johney Frame.     Copy of ICM check sent to Dr. Johney Frame.    3 month ICM trend: 12/22/2020.    1 Year ICM trend:       [name], RN 12/23/2020 4:51 PM

## 2021-01-13 ENCOUNTER — Ambulatory Visit (INDEPENDENT_AMBULATORY_CARE_PROVIDER_SITE_OTHER): Payer: Medicare HMO

## 2021-01-13 DIAGNOSIS — I428 Other cardiomyopathies: Secondary | ICD-10-CM

## 2021-01-14 ENCOUNTER — Telehealth: Payer: Self-pay | Admitting: Cardiology

## 2021-01-14 NOTE — Telephone Encounter (Signed)
Patient states she is returning a call, but she does not know who called or what it was regarding. She had a remote device check yesterday, but I don't see any documentation. Did anyone call this patient? Please advise.

## 2021-01-14 NOTE — Telephone Encounter (Signed)
Pt called to let device team know she will send in a remote check today.

## 2021-01-15 LAB — CUP PACEART REMOTE DEVICE CHECK
Battery Remaining Longevity: 48 mo
Battery Voltage: 2.97 V
Brady Statistic RV Percent Paced: 0.01 %
Date Time Interrogation Session: 20220831135409
HighPow Impedance: 66 Ohm
Implantable Lead Implant Date: 20150903
Implantable Lead Location: 753860
Implantable Lead Model: 6935
Implantable Pulse Generator Implant Date: 20150903
Lead Channel Impedance Value: 304 Ohm
Lead Channel Impedance Value: 399 Ohm
Lead Channel Pacing Threshold Amplitude: 1 V
Lead Channel Pacing Threshold Pulse Width: 0.4 ms
Lead Channel Sensing Intrinsic Amplitude: 16.875 mV
Lead Channel Sensing Intrinsic Amplitude: 16.875 mV
Lead Channel Setting Pacing Amplitude: 2 V
Lead Channel Setting Pacing Pulse Width: 0.4 ms
Lead Channel Setting Sensing Sensitivity: 0.3 mV

## 2021-01-16 NOTE — Telephone Encounter (Signed)
Transmission received. 01/14/2021 

## 2021-01-26 ENCOUNTER — Ambulatory Visit (INDEPENDENT_AMBULATORY_CARE_PROVIDER_SITE_OTHER): Payer: Medicare HMO

## 2021-01-26 DIAGNOSIS — I5022 Chronic systolic (congestive) heart failure: Secondary | ICD-10-CM

## 2021-01-26 DIAGNOSIS — Z95810 Presence of automatic (implantable) cardiac defibrillator: Secondary | ICD-10-CM | POA: Diagnosis not present

## 2021-01-27 NOTE — Progress Notes (Signed)
Remote ICD transmission.   

## 2021-01-28 NOTE — Progress Notes (Signed)
EPIC Encounter for ICM Monitoring  Patient Name: Anna Beltran is a 70 y.o. female Date: 01/28/2021 Primary Care Physican: Rebecka Apley, NP Primary Cardiologist: Jens Som Electrophysiologist: Allred 01/28/2021 Weight: 232 lbs                                                Spoke with patient and heart failure questions reviewed.  Pt asymptomatic for fluid accumulation and feeling well.   Optivol thoracic impedance normal.   Prescribed: Furosemide 20 mg take 2 tablets (40 mg total) daily as needed.   Labs: 09/14/2019 Creatinine 1.06, BUN 20, Potassium 4.2, Sodium 145, GFR 53-61 A complete set of results can be found in Results Review.   Recommendations:  No changes and encouraged to call if experiencing any fluid symptoms.   Follow-up plan: ICM clinic phone appointment on 03/02/2021.   91 day device clinic remote transmission 04/14/2021.     EP/Cardiology Office Visits: 02/11/2021 with Dr. Jens Som.  Recall 09/08/2020 with Dr Johney Frame.     Copy of ICM check sent to Dr. Johney Frame.     3 month ICM trend: 01/26/2021.    1 Year ICM trend:       [name], RN 01/28/2021 2:51 PM

## 2021-02-06 NOTE — Progress Notes (Signed)
HPI: FU congestive heart failure/cardiomyopathy. Previously followed in [facility] Carotid Dopplers in April 2014 showed less than 39% left carotid stenosis. Chest CT in March of 2014 showed no pulmonary embolus, small to moderate right effusion which was felt to be partially loculated. Cardiac catheterization in October of 2014 showed nonobstructive coronary disease and an ejection fraction of 30%. Right heart pressures were normal. Apparently patient has had poor compliance with treatment and poor tolerance of treatment based on outside records. ICD placed 9/15.  Echocardiogram October 2020 showed ejection fraction 35 to 40%, severe left ventricular enlargement, moderate right atrial enlargement. Since she was last seen, she has dyspnea on exertion but no orthopnea, PND, chest pain or syncope.  Minimal pedal edema.  Current Outpatient Medications  Medication Sig Dispense Refill   amLODipine (NORVASC) 5 MG tablet Take 1 tablet by mouth once daily 90 tablet 0   aspirin 81 MG tablet Take 81 mg by mouth daily.     carvedilol (COREG) 25 MG tablet TAKE 1 TABLET BY MOUTH TWICE DAILY WITH MEALS 180 tablet 0   furosemide (LASIX) 20 MG tablet Take 20 mg by mouth 2 (two) times daily as needed.     glipiZIDE (GLUCOTROL) 5 MG tablet Take 5 mg by mouth 2 (two) times daily before a meal.      hydrALAZINE (APRESOLINE) 100 MG tablet TAKE 1 TABLET BY MOUTH THREE TIMES DAILY 270 tablet 0   metFORMIN (GLUCOPHAGE) 1000 MG tablet Take 1,000 mg by mouth 2 (two) times daily with a meal.      omeprazole (PRILOSEC) 20 MG capsule Take 1 capsule (20 mg total) by mouth 2 (two) times daily. 180 capsule 1   Polyvinyl Alcohol-Povidone (REFRESH OP) Place 1 drop into both eyes 2 (two) times daily as needed (for dry eyes).      sacubitril-valsartan (ENTRESTO) 97-103 MG Take 1 tablet by mouth twice daily 180 tablet 1   No current facility-administered medications for this visit.     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   CHF (congestive heart failure) (HCC)    MPI 09/05/12 NORMAL, showed no scar & LVEF is 20-25%. ECHO 12/11/12 LV is markedly dilated, Overall LV systolic function severely impaired with EF = 25-30%, pseudonormal LV filling pattern (consistant with elevated LA pressure).   Diabetes mellitus, type 2 (HCC)    TYPE 2   Essential hypertension, benign    Hyperlipidemia, mixed    Hypersomnia, unspecified    Hypertensive cardiomyopathy (HCC)    By report there is poor compliance w/treatment, poor tolerance of treatment & poor symptom control. Symptoms include dyspnea & DOE.   Obesity    PUD (peptic ulcer disease)    Unspecified menopausal and postmenopausal disorder     Past Surgical History:  Procedure Laterality Date   CARDIAC DEFIBRILLATOR PLACEMENT  01/17/2014   DR Johney Frame   EXCISION OF SKIN TAG N/A 12/01/2018   Procedure: EXCISION OF SKIN TAG ANAL;  Surgeon: Romie Levee, MD;  Location: [facility] ORS;  Service: General;  Laterality: N/A;   IMPLANTABLE CARDIOVERTER DEFIBRILLATOR IMPLANT N/A 01/17/2014   Procedure: IMPLANTABLE CARDIOVERTER DEFIBRILLATOR IMPLANT;  Surgeon: Gardiner Rhyme, MD;  Location: [facility] CATH LAB;  Service: Cardiovascular;  Laterality: N/A;   LEFT AND RIGHT HEART CATHETERIZATION WITH  CORONARY ANGIOGRAM N/A 02/27/2013   Procedure: LEFT AND RIGHT HEART CATHETERIZATION WITH CORONARY ANGIOGRAM;  Surgeon: Rollene Rotunda, MD;  Location: [facility] CATH LAB;  Service: Cardiovascular;  Laterality: N/A;   none      Social History   Socioeconomic History   Marital status: Married    Spouse name: Not on file   Number of children: 5   Years of education: Not on file   Highest education level: Not on file  Occupational History    Comment: Retired  Tobacco Use   Smoking status: Never    Smokeless tobacco: Never  Vaping Use   Vaping Use: Never used  Substance and Sexual Activity   Alcohol use: No   Drug use: No   Sexual activity: Never  Other Topics Concern   Not on file  Social History Narrative   Lives in [place] with husband and son.  Retired from [employer] Strain: Not on file  Food Insecurity: Not on file  Transportation Needs: Not on file  Physical Activity: Not on file  Stress: Not on file  Social Connections: Not on file  Intimate Partner Violence: Not on file    Family History  Problem Relation Age of Onset   Diabetes Mellitus II Mother    Cancer Brother        Colon   Hypertension Father     ROS: no fevers or chills, productive cough, hemoptysis, dysphasia, odynophagia, melena, hematochezia, dysuria, hematuria, rash, seizure activity, orthopnea, PND, pedal edema, claudication. Remaining systems are negative.  Physical Exam: Well-developed well-nourished in no acute distress.  Skin is warm and dry.  HEENT is normal.  Neck is supple.  Chest is clear to auscultation with normal expansion.  Cardiovascular exam is regular rate and rhythm.  Abdominal exam nontender or distended. No masses palpated. Extremities show no edema. neuro grossly intact  A/P  1 nonischemic cardiomyopathy-continue Entresto and beta-blocker at present dose.  Continue hydralazine.  She has an intolerance to isosorbide as it causes severe headaches.  Repeat echocardiogram.  2 chronic systolic congestive heart failure-we will continue Lasix at present dose.  Add spironolactone 25 mg daily.  I would like for her to be on Jardiance or Farxiga.  Of asked her to discuss this with primary care and she is presently on metformin and glipizide and 1 of these can likely be discontinued with the addition of above.  3 hypertension-blood pressure elevated but she has not taken her medications today.  We will follow and advance  regimen as needed.  4 hyperlipidemia-continue statin.  5 coronary artery disease patient denies chest pain.  Continue aspirin and statin.  6 previous ICD-managed by electrophysiology.  7 morbid obesity-we previously discussed importance of weight loss.  Olga Millers, MD

## 2021-02-11 ENCOUNTER — Encounter: Payer: Self-pay | Admitting: Cardiology

## 2021-02-11 ENCOUNTER — Other Ambulatory Visit: Payer: Self-pay

## 2021-02-11 ENCOUNTER — Ambulatory Visit: Payer: Medicare HMO | Admitting: Cardiology

## 2021-02-11 VITALS — BP 150/80 | HR 69 | Ht 64.0 in | Wt 227.6 lb

## 2021-02-11 DIAGNOSIS — I2510 Atherosclerotic heart disease of native coronary artery without angina pectoris: Secondary | ICD-10-CM

## 2021-02-11 DIAGNOSIS — I10 Essential (primary) hypertension: Secondary | ICD-10-CM

## 2021-02-11 DIAGNOSIS — I2583 Coronary atherosclerosis due to lipid rich plaque: Secondary | ICD-10-CM

## 2021-02-11 DIAGNOSIS — E7800 Pure hypercholesterolemia, unspecified: Secondary | ICD-10-CM

## 2021-02-11 DIAGNOSIS — I428 Other cardiomyopathies: Secondary | ICD-10-CM | POA: Diagnosis not present

## 2021-02-11 DIAGNOSIS — I420 Dilated cardiomyopathy: Secondary | ICD-10-CM

## 2021-02-11 DIAGNOSIS — I5022 Chronic systolic (congestive) heart failure: Secondary | ICD-10-CM

## 2021-02-11 MED ORDER — ENTRESTO 97-103 MG PO TABS: 1.0000 | ORAL_TABLET | Freq: Two times a day (BID) | ORAL | 3 refills | Status: AC

## 2021-02-11 MED ORDER — SPIRONOLACTONE 25 MG PO TABS: 25.0000 mg | ORAL_TABLET | Freq: Every day | ORAL | 3 refills | Status: DC

## 2021-02-11 NOTE — Patient Instructions (Signed)
Medication Instructions:   START SPIRONOLACTONE 25 MG ONCE DAILY  *If you need a refill on your cardiac medications before your next appointment, please call your pharmacy*   Testing/Procedures:  Your physician has requested that you have an echocardiogram. Echocardiography is a painless test that uses sound waves to create images of your heart. It provides your doctor with information about the size and shape of your heart and how well your heart's chambers and valves are working. This procedure takes approximately one hour. There are no restrictions for this procedure. [address]   Follow-Up: At [facility], you and your health needs are our priority.  As part of our continuing mission to provide you with exceptional heart care, we have created designated Provider Care Teams.  These Care Teams include your primary Cardiologist (physician) and Advanced Practice Providers (APPs -  Physician Assistants and Nurse Practitioners) who all work together to provide you with the care you need, when you need it.  We recommend signing up for the patient portal called "MyChart".  Sign up information is provided on this After Visit Summary.  MyChart is used to connect with patients for Virtual Visits (Telemedicine).  Patients are able to view lab/test results, encounter notes, upcoming appointments, etc.  Non-urgent messages can be sent to your provider as well.   To learn more about what you can do with MyChart, go to [email or web address].    Your next appointment:   6 month(s)  The format for your next appointment:   In Person  Provider:   Olga Millers, MD

## 2021-02-11 NOTE — Addendum Note (Signed)
Addended by: Freddi Starr on: 02/11/2021 10:41 AM   Modules accepted: Orders

## 2021-03-02 ENCOUNTER — Ambulatory Visit (HOSPITAL_COMMUNITY): Payer: Medicare HMO | Attending: Internal Medicine

## 2021-03-02 ENCOUNTER — Encounter: Payer: Self-pay | Admitting: *Deleted

## 2021-03-02 ENCOUNTER — Other Ambulatory Visit: Payer: Self-pay

## 2021-03-02 ENCOUNTER — Ambulatory Visit (INDEPENDENT_AMBULATORY_CARE_PROVIDER_SITE_OTHER): Payer: Medicare HMO

## 2021-03-02 DIAGNOSIS — I428 Other cardiomyopathies: Secondary | ICD-10-CM | POA: Diagnosis present

## 2021-03-02 DIAGNOSIS — Z95810 Presence of automatic (implantable) cardiac defibrillator: Secondary | ICD-10-CM

## 2021-03-02 DIAGNOSIS — I5022 Chronic systolic (congestive) heart failure: Secondary | ICD-10-CM | POA: Diagnosis not present

## 2021-03-02 LAB — ECHOCARDIOGRAM COMPLETE
Area-P 1/2: 4.31 cm2
S' Lateral: 4.23 cm

## 2021-03-02 MED ORDER — PERFLUTREN LIPID MICROSPHERE
1.0000 mL | INTRAVENOUS | Status: AC | PRN
  Administered 2021-03-02: 1 mL via INTRAVENOUS
  Administered 2021-03-02: 3 mL via INTRAVENOUS

## 2021-03-06 NOTE — Progress Notes (Signed)
EPIC Encounter for ICM Monitoring  Patient Name: DELIYAH MUCKLE is a 70 y.o. female Date: 03/06/2021 Primary Care Physican: Rebecka Apley, NP Primary Cardiologist: Jens Som Electrophysiologist: Allred 01/28/2021 Weight: 232 lbs                                                Spoke with patient and heart failure questions reviewed.  Pt asymptomatic for fluid accumulation and feeling well.   Optivol thoracic impedance normal.   Prescribed: Furosemide 20 mg take 1 tablet (20 mg total) by mouth twice a day as needed.   Labs: 09/14/2019 Creatinine 1.06, BUN 20, Potassium 4.2, Sodium 145, GFR 53-61 A complete set of results can be found in Results Review.   Recommendations:     Follow-up plan: ICM clinic phone appointment on 04/15/2021.   91 day device clinic remote transmission 04/14/2021.     EP/Cardiology Office Visits:   Recall 09/08/2020 with Dr Johney Frame.     Copy of ICM check sent to Dr. Johney Frame.      3 month ICM trend: 03/02/2021.    1 Year ICM trend:       [name], RN 03/06/2021 11:05 AM

## 2021-04-02 ENCOUNTER — Telehealth: Payer: Self-pay | Admitting: Pulmonary Disease

## 2021-04-02 ENCOUNTER — Other Ambulatory Visit: Payer: Self-pay

## 2021-04-02 DIAGNOSIS — J9611 Chronic respiratory failure with hypoxia: Secondary | ICD-10-CM

## 2021-04-02 MED ORDER — ALBUTEROL SULFATE HFA 108 (90 BASE) MCG/ACT IN AERS: 2.0000 | INHALATION_SPRAY | Freq: Four times a day (QID) | RESPIRATORY_TRACT | 6 refills | Status: AC | PRN

## 2021-04-02 MED ORDER — FLUTICASONE PROPIONATE HFA 110 MCG/ACT IN AERO: 2.0000 | INHALATION_SPRAY | Freq: Two times a day (BID) | RESPIRATORY_TRACT | 12 refills | Status: DC

## 2021-04-02 NOTE — Telephone Encounter (Signed)
Primary Pulmonologist: Dr. Craige Cotta Last office visit and with whom: Dr. Craige Cotta 10-21-20 What do we see them for (pulmonary problems): Resp failure with hypoxia  Last OV assessment/plan: see below   Was appointment offered to patient (explain)?  No    Reason for call: Patient calling to let Dr. Craige Cotta know she has an increase in SOB. She states she is coughing up clear mucus. Uses 2LO2. When asked, she states she has not increased her oxygen. No wheezing or fever.   Allergies  Allergen Reactions   Isosorbide Nitrate Other (See Comments)    Very bad headaches    Immunization History  Administered Date(s) Administered   Fluad Quad(high Dose 65+) 02/08/2019   Influenza, High Dose Seasonal PF 02/25/2015, 03/30/2016, 02/28/2017, 03/09/2018, 02/08/2019   Moderna Sars-Covid-2 Vaccination 08/09/2019, 09/11/2019, 05/01/2020   Pneumococcal Conjugate-13 08/30/2016   Pneumococcal Polysaccharide-23 06/09/2018    Assessment/Plan:    Mild, intermittent asthma. - not having symptoms - she would prefer to call and get refill on inhalers (flovent, albuterol) if her symptoms progress   Chronic respiratory failure with hypoxia. - goal SpO2 > 90% - continue 2 to 3 liters with exertion and at night - will have [facility] arrange for a POC   Chronic systolic CHF from non ischemic cardiomyopathy. - followed by Dr. Jens Som with cardiology   Time Spent Involved in Patient Care on Day of Examination:  32 minutes   Follow up:  Patient Instructions  Call if you develop a cough or chest congestion and we can refill flovent inhaler   Will have [facility] arrange for a portable oxygen concentrator   Follow up in 1 year

## 2021-04-02 NOTE — Telephone Encounter (Signed)
Called and spoke to patient. She was agreeable to  do a best fit walk and Ok'd per VS. Called [employer] and asked Melissa about whether patient would qualify for inogen or not per pt request. She states that patient will not qualify unless she has has O2 less than 30 days or is a new O2 start. But that [employer] could do a best fit if ordered to help the patient with obtaining portable oxygen. Patient voiced understanding about this and is willing to do a best fit. Prescriptions called in and nothing further needed at this time.

## 2021-04-02 NOTE — Telephone Encounter (Signed)
Please sent script for flovent 110 mcg two puffs bid and albuterol hfa two puffs every 6 hours as needed for cough, wheeze or chest congestion   Can send order to [facility] to arrange for POC at 3 liters.  Please let her know there has been back order on POCs.

## 2021-04-14 ENCOUNTER — Ambulatory Visit (INDEPENDENT_AMBULATORY_CARE_PROVIDER_SITE_OTHER): Payer: Medicare HMO

## 2021-04-14 DIAGNOSIS — I428 Other cardiomyopathies: Secondary | ICD-10-CM

## 2021-04-14 LAB — CUP PACEART REMOTE DEVICE CHECK
Battery Remaining Longevity: 47 mo
Battery Voltage: 2.98 V
Brady Statistic RV Percent Paced: 0.01 %
Date Time Interrogation Session: 20221129022724
HighPow Impedance: 61 Ohm
Implantable Lead Implant Date: 20150903
Implantable Lead Location: 753860
Implantable Lead Model: 6935
Implantable Pulse Generator Implant Date: 20150903
Lead Channel Impedance Value: 304 Ohm
Lead Channel Impedance Value: 399 Ohm
Lead Channel Pacing Threshold Amplitude: 1 V
Lead Channel Pacing Threshold Pulse Width: 0.4 ms
Lead Channel Sensing Intrinsic Amplitude: 17.75 mV
Lead Channel Sensing Intrinsic Amplitude: 17.75 mV
Lead Channel Setting Pacing Amplitude: 2 V
Lead Channel Setting Pacing Pulse Width: 0.4 ms
Lead Channel Setting Sensing Sensitivity: 0.3 mV

## 2021-04-15 ENCOUNTER — Ambulatory Visit (INDEPENDENT_AMBULATORY_CARE_PROVIDER_SITE_OTHER): Payer: Medicare HMO

## 2021-04-15 DIAGNOSIS — I5022 Chronic systolic (congestive) heart failure: Secondary | ICD-10-CM

## 2021-04-15 DIAGNOSIS — Z95810 Presence of automatic (implantable) cardiac defibrillator: Secondary | ICD-10-CM | POA: Diagnosis not present

## 2021-04-15 NOTE — Progress Notes (Signed)
EPIC Encounter for ICM Monitoring  Patient Name: Anna Beltran is a 70 y.o. female Date: 04/15/2021 Primary Care Physican: Rebecka Apley, NP Primary Cardiologist: Jens Som Electrophysiologist: Allred 01/28/2021 Weight: 232 lbs                                                Attempted call to patient and unable to reach.  Left detailed message per DPR regarding transmission. Transmission reviewed.    Optivol thoracic impedance suggesting possible fluid accumulation starting 11/23.   Prescribed: Furosemide 20 mg take 1 tablet (20 mg total) by mouth twice a day as needed.   Labs: 09/14/2019 Creatinine 1.06, BUN 20, Potassium 4.2, Sodium 145, GFR 53-61 A complete set of results can be found in Results Review.   Recommendations:  Left voice mail with ICM number and encouraged to call if experiencing any fluid symptoms.  Will advise to take PRN Furosemide x 2 days if reached.    Follow-up plan: ICM clinic phone appointment on 04/21/2021 to recheck fluid levels.   91 day device clinic remote transmission 04/14/2021.     EP/Cardiology Office Visits:   Recall 09/08/2020 with Dr Johney Frame.     Copy of ICM check sent to Dr. Johney Frame.    Will send to Dr Jens Som for review if patient is reached.   3 month ICM trend: 04/14/2021.    12-14 Month ICM trend:       [name], RN 04/15/2021 2:09 PM

## 2021-04-21 ENCOUNTER — Ambulatory Visit (INDEPENDENT_AMBULATORY_CARE_PROVIDER_SITE_OTHER): Payer: Medicare HMO

## 2021-04-21 DIAGNOSIS — I5022 Chronic systolic (congestive) heart failure: Secondary | ICD-10-CM

## 2021-04-21 DIAGNOSIS — Z95810 Presence of automatic (implantable) cardiac defibrillator: Secondary | ICD-10-CM

## 2021-04-22 NOTE — Progress Notes (Signed)
EPIC Encounter for ICM Monitoring  Patient Name: Anna Beltran is a 70 y.o. female Date: 04/22/2021 Primary Care Physican: Rebecka Apley, NP Primary Cardiologist: Jens Som Electrophysiologist: Allred 01/28/2021 Weight: 232 lbs                                                Transmission reviewed.    Optivol thoracic impedance suggesting fluid levels returned close to baseline.   Prescribed: Furosemide 20 mg take 1 tablet (20 mg total) by mouth twice a day as needed.   Labs: 09/14/2019 Creatinine 1.06, BUN 20, Potassium 4.2, Sodium 145, GFR 53-61 A complete set of results can be found in Results Review.   Recommendations:  No changes   Follow-up plan: ICM clinic phone appointment on 05/25/2021.   91 day device clinic remote transmission 07/14/2021.     EP/Cardiology Office Visits:  08/13/2021 with Dr Jens Som.  Recall 09/08/2020 with Dr Johney Frame.     Copy of ICM check sent to Dr. Johney Frame.  3 month ICM trend: 04/21/2021.    12-14 Month ICM trend:       [name], RN 04/22/2021 2:47 PM

## 2021-04-23 NOTE — Progress Notes (Signed)
Remote ICD transmission.   

## 2021-05-25 ENCOUNTER — Ambulatory Visit (INDEPENDENT_AMBULATORY_CARE_PROVIDER_SITE_OTHER): Payer: Medicare HMO

## 2021-05-25 DIAGNOSIS — I5022 Chronic systolic (congestive) heart failure: Secondary | ICD-10-CM | POA: Diagnosis not present

## 2021-05-25 DIAGNOSIS — Z95810 Presence of automatic (implantable) cardiac defibrillator: Secondary | ICD-10-CM | POA: Diagnosis not present

## 2021-05-26 ENCOUNTER — Telehealth: Payer: Self-pay

## 2021-05-26 NOTE — Telephone Encounter (Signed)
Remote ICM transmission received.  Attempted call to patient regarding ICM remote transmission and left detailed message per DPR.  Advised to return call for any fluid symptoms or questions. Next ICM remote transmission scheduled 06/29/2021.   ° °

## 2021-05-26 NOTE — Progress Notes (Signed)
EPIC Encounter for ICM Monitoring  Patient Name: Anna Beltran is a 71 y.o. female Date: 05/26/2021 Primary Care Physican: Rebecka Apley, NP Primary Cardiologist: Jens Som Electrophysiologist: Allred 01/28/2021 Weight: 232 lbs                                                Attempted call to patient and unable to reach.  Left detailed message per DPR regarding transmission. Transmission reviewed.    Optivol thoracic impedance suggesting fluid levels trending close to baseline.   Prescribed: Furosemide 20 mg take 1 tablet (20 mg total) by mouth twice a day as needed.   Labs: 09/14/2019 Creatinine 1.06, BUN 20, Potassium 4.2, Sodium 145, GFR 53-61 A complete set of results can be found in Results Review.   Recommendations:  Left voice mail with ICM number and encouraged to call if experiencing any fluid symptoms.   Follow-up plan: ICM clinic phone appointment on 06/29/2021.   91 day device clinic remote transmission 07/14/2021.     EP/Cardiology Office Visits:  08/13/2021 with Dr Jens Som.  Recall 09/08/2020 with Dr Johney Frame.     Copy of ICM check sent to Dr. Johney Frame.  3 month ICM trend: 05/25/2021.    12-14 Month ICM trend:     [name], RN 05/26/2021 1:44 PM

## 2021-06-17 ENCOUNTER — Other Ambulatory Visit: Payer: Self-pay | Admitting: Cardiology

## 2021-06-18 ENCOUNTER — Telehealth (INDEPENDENT_AMBULATORY_CARE_PROVIDER_SITE_OTHER): Payer: Medicare HMO | Admitting: Adult Health

## 2021-06-18 ENCOUNTER — Encounter: Payer: Self-pay | Admitting: Adult Health

## 2021-06-18 ENCOUNTER — Telehealth: Payer: Self-pay | Admitting: Pulmonary Disease

## 2021-06-18 ENCOUNTER — Other Ambulatory Visit: Payer: Self-pay

## 2021-06-18 DIAGNOSIS — J4520 Mild intermittent asthma, uncomplicated: Secondary | ICD-10-CM | POA: Diagnosis not present

## 2021-06-18 DIAGNOSIS — J019 Acute sinusitis, unspecified: Secondary | ICD-10-CM | POA: Diagnosis not present

## 2021-06-18 DIAGNOSIS — J9611 Chronic respiratory failure with hypoxia: Secondary | ICD-10-CM

## 2021-06-18 MED ORDER — AZITHROMYCIN 250 MG PO TABS: ORAL_TABLET | ORAL | 0 refills | Status: AC

## 2021-06-18 NOTE — Patient Instructions (Addendum)
Zpack take as directed.  Saline nasal rinses Twice daily   Claritin 10mg  daily for 1 week then As needed  drainage.  Albuterol inhaler As needed  wheezing.  Continue on Oxygen 2l/m with activity and At bedtime   Follow up with Dr. Halford Chessman in 3-4  months and As needed   Please contact office for sooner follow up if symptoms do not improve or worsen or seek emergency care

## 2021-06-18 NOTE — Telephone Encounter (Signed)
Primary Pulmonologist: Dr. Craige Cotta  Last office visit and with whom: 10/21/2020 Dr. Craige Cotta What do we see them for (pulmonary problems): asthma, CHF, chronic resp failure  Last OV assessment/plan: see below   Was appointment offered to patient (explain)?  No. No appts available in rds office     Reason for call: Patient has congestion for the past two weeks, no fevers, no wheezing, no coughing. 2-2.5LO2 of oxygen. Patient has not had to increase oxygen. No recent flu or covid tests. Patient would like to know if we can give her something to help with her congestion. She states the congestion is making it hard to breathe since she is dependent on oxygen   Routing to TP since Dr. Craige Cotta is not in office today.    Allergies  Allergen Reactions   Isosorbide Nitrate Other (See Comments)    Very bad headaches    Immunization History  Administered Date(s) Administered   Fluad Quad(high Dose 65+) 02/08/2019   Influenza, High Dose Seasonal PF 02/25/2015, 03/30/2016, 02/28/2017, 03/09/2018, 02/08/2019   Moderna Sars-Covid-2 Vaccination 08/09/2019, 09/11/2019, 05/01/2020   Pneumococcal Conjugate-13 08/30/2016   Pneumococcal Polysaccharide-23 06/09/2018    Assessment/Plan:    Mild, intermittent asthma. - not having symptoms - she would prefer to call and get refill on inhalers (flovent, albuterol) if her symptoms progress   Chronic respiratory failure with hypoxia. - goal SpO2 > 90% - continue 2 to 3 liters with exertion and at night - will have [facility] arrange for a POC   Chronic systolic CHF from non ischemic cardiomyopathy. - followed by Dr. Jens Som with cardiology   Time Spent Involved in Patient Care on Day of Examination:  32 minutes   Follow up:  Patient Instructions  Call if you develop a cough or chest congestion and we can refill flovent inhaler   Will have [facility] arrange for a portable oxygen concentrator   Follow up in 1 year

## 2021-06-18 NOTE — Progress Notes (Signed)
Virtual Visit via Video Note  I connected with Anna Beltran on 06/18/21 at  4:30 PM EST by a video enabled telemedicine application and verified that I am speaking with the correct person using two identifiers.  Location: Patient: Home  Provider: Office    I discussed the limitations of evaluation and management by telemedicine and the availability of in person appointments. The patient expressed understanding and agreed to proceed.  History of Present Illness: 71 yo female never smoker followed for Asthma and Chronic respiratory failure on oxygen with activity and At bedtime   Medical history significant for CHF , Cardiomyopathy and DM   Today's video visit is for an acute office visit . Complains of 2 weeks of nasal congestion, stopped up nose, drainage. Feels like she has sinus infection . Took sudafed for few days. Blowing out dark nasal discharge. Feels oxygen does not work as well because nose is so stopped up.  No cough, dyspnea, hemoptysis , edema or n/v/d.  No fever or body aches.  Has not tested for Covid .  Covid and flu vaccines are utd.  Says breathing feel okay except sinus are clogged up .  Appetite is good, w/ no n/v/d.  Remains active .   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   CHF (congestive heart failure) (HCC)    MPI 09/05/12 NORMAL, showed no scar & LVEF is 20-25%. ECHO 12/11/12 LV is markedly dilated, Overall LV systolic function severely impaired with EF = 25-30%, pseudonormal LV filling pattern (consistant with elevated LA pressure).   Diabetes mellitus, type 2 (HCC)    TYPE 2   Essential hypertension, benign    Hyperlipidemia, mixed    Hypersomnia, unspecified    Hypertensive cardiomyopathy (HCC)    By report there is poor compliance w/treatment,  poor tolerance of treatment & poor symptom control. Symptoms include dyspnea & DOE.   Obesity    PUD (peptic ulcer disease)    Unspecified menopausal and postmenopausal disorder    Current Outpatient Medications on File Prior to Visit  Medication Sig Dispense Refill   albuterol (VENTOLIN HFA) 108 (90 Base) MCG/ACT inhaler Inhale 2 puffs into the lungs every 6 (six) hours as needed for wheezing or shortness of breath. 18 g 6   amLODipine (NORVASC) 5 MG tablet Take 1 tablet by mouth once daily 90 tablet 0   aspirin 81 MG tablet Take 81 mg by mouth daily.     carvedilol (COREG) 25 MG tablet TAKE 1 TABLET BY MOUTH TWICE DAILY WITH MEALS 180 tablet 1   fluticasone (FLOVENT HFA) 110 MCG/ACT inhaler Inhale 2 puffs into the lungs 2 (two) times daily. 1 each 12   furosemide (LASIX) 20 MG tablet Take 20 mg by mouth 2 (two) times daily as needed.     glipiZIDE (GLUCOTROL) 5 MG tablet Take 5 mg by mouth 2 (two) times daily before a meal.      hydrALAZINE (APRESOLINE) 100 MG tablet TAKE 1 TABLET BY MOUTH THREE TIMES DAILY 270 tablet 0   metFORMIN (GLUCOPHAGE) 1000 MG tablet Take 1,000 mg by mouth 2 (two) times daily with a meal.      omeprazole (PRILOSEC) 20 MG capsule Take 1 capsule (20 mg total) by mouth 2 (two) times daily. 180 capsule 1  Polyvinyl Alcohol-Povidone (REFRESH OP) Place 1 drop into both eyes 2 (two) times daily as needed (for dry eyes).      sacubitril-valsartan (ENTRESTO) 97-103 MG Take 1 tablet by mouth 2 (two) times daily. 180 tablet 3   spironolactone (ALDACTONE) 25 MG tablet Take 1 tablet (25 mg total) by mouth daily. 90 tablet 3   No current facility-administered medications on file prior to visit.        Observations/Objective:  06/18/2021 > appears well with no increased wob. Or NAD on video    PFT 07/26/18 >> FEV1 1.14 (61%), FEV1% 79, TLC 4.19 (82%), RV:TLC 141, DLCO 67%, +BD from FEF 25-75   Sleep Tests:  ONO with RA 11/26/19 >> test time 5 hrs 56 min.  Baseline SpO2  81%, SpO2 low 70%.  Spent 5 hr 56 min with SpO2 < 88%.   Cardiac Tests:  Echo 02/15/19 >> EF 35 to 40%  Assessment and Plan: Acute sinusitis - tx with empiric abx, claritin and saline nasal rinses   Asthma -appears stable  Albuterol inhaler As needed   Asthma action plan discussed   Oxygen dependent respiratory failure - Continue on O2 with act and At bedtime     Plan  Patient Instructions  Zpack take as directed.  Saline nasal rinses Twice daily   Claritin 10mg  daily for 1 week then As needed  drainage.  Albuterol inhaler As needed  wheezing.  Continue on Oxygen 2l/m with activity and At bedtime   Follow up with Dr. in 3-4  months and As needed   Please contact office for sooner follow up if symptoms do not improve or worsen or seek emergency care      Follow Up Instructions: 3-4 months and .As needed     I discussed the assessment and treatment plan with the patient. The patient was provided an opportunity to ask questions and all were answered. The patient agreed with the plan and demonstrated an understanding of the instructions.   The patient was advised to call back or seek an in-person evaluation if the symptoms worsen or if the condition fails to improve as anticipated.  I provided 30  minutes of non-face-to-face time during this encounter.   Craige Cotta, NP

## 2021-06-18 NOTE — Telephone Encounter (Signed)
Lov 10/2020 .  Needs ov , I am glad to see in office or video visit for evaluation  Please contact office for sooner follow up if symptoms do not improve or worsen or seek emergency care

## 2021-06-18 NOTE — Progress Notes (Signed)
Reviewed and agree with assessment/plan. ° ° °Makinze Jani, MD °[facility] °06/18/2021, 10:51 PM °Pager:  [phone] ° °

## 2021-06-18 NOTE — Telephone Encounter (Signed)
Spoke to patient and relayed below message. She voiced her understanding.  She agrees with appt but prefers virtual.  Video visit scheduled for today at 4:30. Nothing further needed at this time.

## 2021-06-29 ENCOUNTER — Ambulatory Visit (INDEPENDENT_AMBULATORY_CARE_PROVIDER_SITE_OTHER): Payer: Medicare HMO

## 2021-06-29 DIAGNOSIS — I5022 Chronic systolic (congestive) heart failure: Secondary | ICD-10-CM

## 2021-06-29 DIAGNOSIS — Z95810 Presence of automatic (implantable) cardiac defibrillator: Secondary | ICD-10-CM | POA: Diagnosis not present

## 2021-06-29 NOTE — Progress Notes (Signed)
EPIC Encounter for ICM Monitoring  Patient Name: Anna Beltran is a 71 y.o. female Date: 06/29/2021 Primary Care Physican: Bridget Hartshorn, NP Primary Cardiologist: Stanford Breed Electrophysiologist: Allred Last Weight: 232 lbs                                                Transmission reviewed.    Optivol thoracic impedance normal but was suggesting intermittent days with possible fluid accumulation in the past month.   Prescribed:  Furosemide 20 mg take 1 tablet (20 mg total) by mouth twice a day as needed. Spironolactone 25 mg take 1 tablet daily   Labs: 09/14/2019 Creatinine 1.06, BUN 20, Potassium 4.2, Sodium 145, GFR 53-61 A complete set of results can be found in Results Review.   Recommendations:  No changes.   Follow-up plan: ICM clinic phone appointment on 08/03/2021.   91 day device clinic remote transmission 07/14/2021.     EP/Cardiology Office Visits: 06/30/2021 with Dr Stanford Breed.  Recall 09/08/2020 with Dr Rayann Heman.     Copy of ICM check sent to Dr. Rayann Heman.  3 month ICM trend: 06/29/2021.    12-14 Month ICM trend:     Rosalene Billings, RN 06/29/2021 3:25 PM

## 2021-06-30 ENCOUNTER — Ambulatory Visit (INDEPENDENT_AMBULATORY_CARE_PROVIDER_SITE_OTHER): Payer: Medicare HMO

## 2021-06-30 ENCOUNTER — Other Ambulatory Visit: Payer: Self-pay

## 2021-06-30 ENCOUNTER — Other Ambulatory Visit: Payer: Self-pay | Admitting: Cardiology

## 2021-06-30 ENCOUNTER — Encounter: Payer: Self-pay | Admitting: Cardiology

## 2021-06-30 ENCOUNTER — Ambulatory Visit: Payer: Medicare HMO | Admitting: Cardiology

## 2021-06-30 VITALS — BP 140/72 | HR 69 | Ht 63.0 in | Wt 222.6 lb

## 2021-06-30 DIAGNOSIS — R001 Bradycardia, unspecified: Secondary | ICD-10-CM

## 2021-06-30 DIAGNOSIS — I428 Other cardiomyopathies: Secondary | ICD-10-CM | POA: Diagnosis not present

## 2021-06-30 DIAGNOSIS — I2510 Atherosclerotic heart disease of native coronary artery without angina pectoris: Secondary | ICD-10-CM | POA: Diagnosis not present

## 2021-06-30 DIAGNOSIS — I2583 Coronary atherosclerosis due to lipid rich plaque: Secondary | ICD-10-CM

## 2021-06-30 DIAGNOSIS — I5042 Chronic combined systolic (congestive) and diastolic (congestive) heart failure: Secondary | ICD-10-CM

## 2021-06-30 DIAGNOSIS — I10 Essential (primary) hypertension: Secondary | ICD-10-CM | POA: Diagnosis not present

## 2021-06-30 DIAGNOSIS — E7800 Pure hypercholesterolemia, unspecified: Secondary | ICD-10-CM

## 2021-06-30 NOTE — Progress Notes (Signed)
HPI:FU congestive heart failure/cardiomyopathy. Previously followed in [facility]. Carotid Dopplers in April 2014 showed less than 39% left carotid stenosis. Chest CT in March of 2014 showed no pulmonary embolus, small to moderate right effusion which was felt to be partially loculated. Cardiac catheterization in October of 2014 showed nonobstructive coronary disease and an ejection fraction of 30%. Right heart pressures were normal. Apparently patient has had poor compliance and tolerance of treatment based on outside records. ICD placed 9/15.  Echocardiogram 2022 showed ejection fraction 40%, moderate left ventricular hypertrophy, grade 1 diastolic dysfunction, mild right ventricular enlargement, moderate tricuspid regurgitation, moderate pulmonic insufficiency.  Since she was last seen, patient recently has had sinus trouble improved with antibiotics.  However she continues to have dyspnea on exertion.  Question orthopnea.  Occasional pedal edema.  No chest pain or syncope.  She states her heart rate decreased to 25 at 1 point.  Current Outpatient Medications  Medication Sig Dispense Refill   albuterol (VENTOLIN HFA) 108 (90 Base) MCG/ACT inhaler Inhale 2 puffs into the lungs every 6 (six) hours as needed for wheezing or shortness of breath. 18 g 6   amLODipine (NORVASC) 5 MG tablet Take 1 tablet by mouth once daily 90 tablet 0   aspirin 81 MG tablet Take 81 mg by mouth daily.     carvedilol (COREG) 25 MG tablet TAKE 1 TABLET BY MOUTH TWICE DAILY WITH MEALS 180 tablet 1   furosemide (LASIX) 20 MG tablet Take 20 mg by mouth 2 (two) times daily as needed.     glipiZIDE (GLUCOTROL) 5 MG tablet Take 5 mg by mouth 2 (two) times daily before a meal.      hydrALAZINE (APRESOLINE) 100 MG tablet TAKE 1 TABLET BY MOUTH THREE TIMES DAILY 270 tablet 0   metFORMIN (GLUCOPHAGE) 1000 MG tablet Take 1,000 mg by mouth 2 (two) times daily with a meal.      omeprazole (PRILOSEC) 20 MG capsule Take 1 capsule  (20 mg total) by mouth 2 (two) times daily. 180 capsule 1   Polyvinyl Alcohol-Povidone (REFRESH OP) Place 1 drop into both eyes 2 (two) times daily as needed (for dry eyes).      sacubitril-valsartan (ENTRESTO) 97-103 MG Take 1 tablet by mouth 2 (two) times daily. 180 tablet 3   fluticasone (FLOVENT HFA) 110 MCG/ACT inhaler Inhale 2 puffs into the lungs 2 (two) times daily. 1 each 12   spironolactone (ALDACTONE) 25 MG tablet Take 1 tablet (25 mg total) by mouth daily. 90 tablet 3   No current facility-administered medications for this visit.     Past Medical History:  Diagnosis Date   Automatic implantable cardioverter-defibrillator in situ 01/17/2014   BY DR Rayann Heman   Bruit    Bilat carotid duplex 09/05/12 CONCLUSION: Mild, less than or equal to 39%, left internal carotid artery stenosis.   CHF (congestive heart failure) ([place])    MPI 09/05/12 NORMAL, showed no scar & LVEF is 20-25%. ECHO 12/11/12 LV is markedly dilated, Overall LV systolic function severely impaired with EF = 25-30%, pseudonormal LV filling pattern (consistant with elevated LA pressure).   Diabetes mellitus, type 2 ([place])    TYPE 2   Essential hypertension, benign    Hyperlipidemia, mixed    Hypersomnia, unspecified    Hypertensive cardiomyopathy ([place])    By report there is poor compliance w/treatment, poor tolerance of treatment & poor symptom control. Symptoms include dyspnea & DOE.   Obesity    PUD (peptic  ulcer disease)    Unspecified menopausal and postmenopausal disorder     Past Surgical History:  Procedure Laterality Date   CARDIAC DEFIBRILLATOR PLACEMENT  01/17/2014   DR Rayann Heman   EXCISION OF SKIN TAG N/A 12/01/2018   Procedure: EXCISION OF SKIN TAG ANAL;  Surgeon: Leighton Ruff, MD;  Location: [facility] ORS;  Service: General;  Laterality: N/A;   IMPLANTABLE CARDIOVERTER DEFIBRILLATOR IMPLANT N/A 01/17/2014   Procedure: IMPLANTABLE CARDIOVERTER DEFIBRILLATOR IMPLANT;  Surgeon: Coralyn Mark, MD;  Location: [facility] CATH  LAB;  Service: Cardiovascular;  Laterality: N/A;   LEFT AND RIGHT HEART CATHETERIZATION WITH CORONARY ANGIOGRAM N/A 02/27/2013   Procedure: LEFT AND RIGHT HEART CATHETERIZATION WITH CORONARY ANGIOGRAM;  Surgeon: Minus Breeding, MD;  Location: [facility] CATH LAB;  Service: Cardiovascular;  Laterality: N/A;   none      Social History   Socioeconomic History   Marital status: Married    Spouse name: Not on file   Number of children: 5   Years of education: Not on file   Highest education level: Not on file  Occupational History    Comment: Retired  Tobacco Use   Smoking status: Never   Smokeless tobacco: Never  Vaping Use   Vaping Use: Never used  Substance and Sexual Activity   Alcohol use: No   Drug use: No   Sexual activity: Never  Other Topics Concern   Not on file  Social History Narrative   Lives in [place] with husband and son.  Retired from Acupuncturist of Radio broadcast assistant Strain: Not on file  Food Insecurity: Not on file  Transportation Needs: Not on file  Physical Activity: Not on file  Stress: Not on file  Social Connections: Not on file  Intimate Partner Violence: Not on file    Family History  Problem Relation Age of Onset   Diabetes Mellitus II Mother    Cancer Brother        Colon   Hypertension Father     ROS: no fevers or chills, productive cough, hemoptysis, dysphasia, odynophagia, melena, hematochezia, dysuria, hematuria, rash, seizure activity, orthopnea, PND, claudication. Remaining systems are negative.  Physical Exam: Well-developed well-nourished in no acute distress.  Skin is warm and dry.  HEENT is normal.  Neck is supple.  Chest is clear to auscultation with normal expansion.  Cardiovascular exam is regular rate and rhythm.  Abdominal exam nontender or distended. No masses palpated. Extremities show no edema. neuro grossly intact  ECG-normal sinus rhythm at a rate of 69, inferolateral T wave inversion.   Personally reviewed  A/P  1 nonischemic cardiomyopathy-continue Entresto, beta-blocker and hydralazine.  Nitrates caused severe headaches previously and we will avoid. Most recent echocardiogram shows moderate LV dysfunction.  2 chronic combined systolic/diastolic congestive heart failure-patient describes increased dyspnea on exertion.  Some of this sounds potentially to be related to pulmonary status.  However I will add Lasix 20 mg daily.  In 1 week check potassium, renal function and BNP.  I have not added Farxiga due to morbid obesity and risk of infection.  3 hypertension-blood pressure is borderline.  We are adding Lasix and will follow and adjust regimen as needed.  4 hyperlipidemia-continue statin.  5 coronary artery disease-plan to continue medical therapy with aspirin and statin.  6 ICD-Per electrophysiology.  7 morbid obesity-we previously discussed importance of weight loss.  8 question bradycardia-patient states that she had a heart rate of 25 at 1 point.  We will  arrange a 3-day Zio patch to further assess.  Kirk Ruths, MD

## 2021-06-30 NOTE — Patient Instructions (Signed)
Medication Instructions:   TAKE FUROSEMIDE 20 MG EVERY DAY  *If you need a refill on your cardiac medications before your next appointment, please call your pharmacy*   Lab Work:  Your physician recommends that you return for lab work in: [place]  If you have labs (blood work) drawn today and your tests are completely normal, you will receive your results only by: [place] (if you have MyChart) OR A paper copy in the mail If you have any lab test that is abnormal or we need to change your treatment, we will call you to review the results.   Testing/Procedures:  [facility]- Long Term Monitor Instructions  Your physician has requested you wear a ZIO patch monitor for 3 days.  This is a single patch monitor. [employer] supplies one patch monitor per enrollment. Additional stickers are not available. Please do not apply patch if you will be having a Nuclear Stress Test,  Echocardiogram, Cardiac CT, MRI, or Chest Xray during the period you would be wearing the  monitor. The patch cannot be worn during these tests. You cannot remove and re-apply the  ZIO XT patch monitor.  Your ZIO patch monitor will be mailed 3 day USPS to your address on file. It may take 3-5 days  to receive your monitor after you have been enrolled.  Once you have received your monitor, please review the enclosed instructions. Your monitor  has already been registered assigning a specific monitor serial # to you.  Billing and Patient Assistance Program Information  We have supplied [employer] with any of your insurance information on file for billing purposes. [employer] offers a sliding scale Patient Assistance Program for patients that do not have  insurance, or whose insurance does not completely cover the cost of the ZIO monitor.  You must apply for the Patient Assistance Program to qualify for this discounted rate.  To apply, please call [employer] at [phone], select option 4, select  option 2, ask to apply for  Patient Assistance Program. Theodore Demark will ask your household income, and how many people  are in your household. They will quote your out-of-pocket cost based on that information.  [employer] will also be able to set up a 108-month, interest-free payment plan if needed.  Applying the monitor   Shave hair from upper left chest.  Hold abrader disc by orange tab. Rub abrader in 40 strokes over the upper left chest as  indicated in your monitor instructions.  Clean area with 4 enclosed alcohol pads. Let dry.  Apply patch as indicated in monitor instructions. Patch will be placed under collarbone on left  side of chest with arrow pointing upward.  Rub patch adhesive wings for 2 minutes. Remove white label marked "1". Remove the white  label marked "2". Rub patch adhesive wings for 2 additional minutes.  While looking in a mirror, press and release button in center of patch. A small green light will  flash 3-4 times. This will be your only indicator that the monitor has been turned on.  Do not shower for the first 24 hours. You may shower after the first 24 hours.  Press the button if you feel a symptom. You will hear a small click. Record Date, Time and  Symptom in the Patient Logbook.  When you are ready to remove the patch, follow instructions on the last 2 pages of Patient  Logbook. Stick patch monitor onto the last page of Patient Logbook.  Place  Patient Logbook in the blue and white box. Use locking tab on box and tape box closed  securely. The blue and white box has prepaid postage on it. Please place it in the mailbox as  soon as possible. Your physician should have your test results approximately 7 days after the  monitor has been mailed back to [facility].  Call [place] at [phone] if you have questions regarding  your ZIO XT patch monitor. Call them immediately if you see an orange light blinking on your  monitor.  If your monitor  falls off in less than 4 days, contact our Monitor department at [phone].  If your monitor becomes loose or falls off after 4 days call [employer] at [phone] for  suggestions on securing your monitor    Follow-Up: At [facility], you and your health needs are our priority.  As part of our continuing mission to provide you with exceptional heart care, we have created designated Provider Care Teams.  These Care Teams include your primary Cardiologist (physician) and Advanced Practice Providers (APPs -  Physician Assistants and Nurse Practitioners) who all work together to provide you with the care you need, when you need it.  We recommend signing up for the patient portal called "MyChart".  Sign up information is provided on this After Visit Summary.  MyChart is used to connect with patients for Virtual Visits (Telemedicine).  Patients are able to view lab/test results, encounter notes, upcoming appointments, etc.  Non-urgent messages can be sent to your provider as well.   To learn more about what you can do with MyChart, go to [email or web address].    Your next appointment:   8-12 week(s)  The format for your next appointment:   In Person  Provider:   Coletta Memos, FNP or Sande Rives, PA-C    Then, Kirk Ruths, MD will plan to see you again in 6 month(s).

## 2021-06-30 NOTE — Progress Notes (Unsigned)
Enrolled for [employer] to mail a ZIO XT long term holter monitor to the patients address on file.  

## 2021-07-03 DIAGNOSIS — I428 Other cardiomyopathies: Secondary | ICD-10-CM

## 2021-07-03 DIAGNOSIS — R001 Bradycardia, unspecified: Secondary | ICD-10-CM | POA: Diagnosis not present

## 2021-07-07 ENCOUNTER — Other Ambulatory Visit: Payer: Medicare HMO

## 2021-07-08 LAB — BASIC METABOLIC PANEL
BUN/Creatinine Ratio: 21 (ref 12–28)
BUN: 18 mg/dL (ref 8–27)
CO2: 24 mmol/L (ref 20–29)
Calcium: 10.2 mg/dL (ref 8.7–10.3)
Chloride: 106 mmol/L (ref 96–106)
Creatinine, Ser: 0.87 mg/dL (ref 0.57–1.00)
Glucose: 104 mg/dL — ABNORMAL HIGH (ref 70–99)
Potassium: 4.2 mmol/L (ref 3.5–5.2)
Sodium: 144 mmol/L (ref 134–144)
eGFR: 71 mL/min/{1.73_m2} (ref >59)

## 2021-07-08 LAB — PRO B NATRIURETIC PEPTIDE: NT-Pro BNP: 699 pg/mL — ABNORMAL HIGH (ref 0–301)

## 2021-07-10 ENCOUNTER — Telehealth: Payer: Self-pay | Admitting: *Deleted

## 2021-07-10 DIAGNOSIS — I5042 Chronic combined systolic (congestive) and diastolic (congestive) heart failure: Secondary | ICD-10-CM

## 2021-07-10 MED ORDER — FUROSEMIDE 20 MG PO TABS: 40.0000 mg | ORAL_TABLET | Freq: Every day | ORAL | 3 refills | Status: DC

## 2021-07-10 NOTE — Telephone Encounter (Signed)
-----   Message from Lewayne Bunting, MD sent at 07/08/2021  2:41 PM EST ----- Change lasix to 40 mg daily; bmet one week Olga Millers

## 2021-07-10 NOTE — Telephone Encounter (Signed)
pt aware of results  New script sent to the pharmacy  Lab orders mailed to the pt  

## 2021-07-14 ENCOUNTER — Ambulatory Visit (INDEPENDENT_AMBULATORY_CARE_PROVIDER_SITE_OTHER): Payer: Medicare HMO

## 2021-07-14 DIAGNOSIS — I428 Other cardiomyopathies: Secondary | ICD-10-CM | POA: Diagnosis not present

## 2021-07-14 LAB — CUP PACEART REMOTE DEVICE CHECK
Battery Remaining Longevity: 44 mo
Battery Voltage: 2.97 V
Brady Statistic RV Percent Paced: 0.01 %
Date Time Interrogation Session: 20230228001804
HighPow Impedance: 69 Ohm
Implantable Lead Implant Date: 20150903
Implantable Lead Location: 753860
Implantable Lead Model: 6935
Implantable Pulse Generator Implant Date: 20150903
Lead Channel Impedance Value: 304 Ohm
Lead Channel Impedance Value: 399 Ohm
Lead Channel Pacing Threshold Amplitude: 1 V
Lead Channel Pacing Threshold Pulse Width: 0.4 ms
Lead Channel Sensing Intrinsic Amplitude: 16.625 mV
Lead Channel Sensing Intrinsic Amplitude: 16.625 mV
Lead Channel Setting Pacing Amplitude: 2 V
Lead Channel Setting Pacing Pulse Width: 0.4 ms
Lead Channel Setting Sensing Sensitivity: 0.3 mV

## 2021-07-22 NOTE — Progress Notes (Signed)
Remote ICD transmission.   

## 2021-07-24 ENCOUNTER — Other Ambulatory Visit: Payer: Self-pay

## 2021-07-24 ENCOUNTER — Other Ambulatory Visit: Payer: Medicare HMO | Admitting: *Deleted

## 2021-07-24 LAB — BASIC METABOLIC PANEL
BUN/Creatinine Ratio: 20 (ref 12–28)
BUN: 15 mg/dL (ref 8–27)
CO2: 22 mmol/L (ref 20–29)
Calcium: 10.4 mg/dL — ABNORMAL HIGH (ref 8.7–10.3)
Chloride: 104 mmol/L (ref 96–106)
Creatinine, Ser: 0.74 mg/dL (ref 0.57–1.00)
Glucose: 94 mg/dL (ref 70–99)
Potassium: 4.7 mmol/L (ref 3.5–5.2)
Sodium: 140 mmol/L (ref 134–144)
eGFR: 86 mL/min/{1.73_m2} (ref >59)

## 2021-07-27 ENCOUNTER — Encounter: Payer: Self-pay | Admitting: *Deleted

## 2021-07-29 ENCOUNTER — Other Ambulatory Visit: Payer: Self-pay | Admitting: Cardiology

## 2021-08-03 ENCOUNTER — Ambulatory Visit (INDEPENDENT_AMBULATORY_CARE_PROVIDER_SITE_OTHER): Payer: Medicare HMO

## 2021-08-03 DIAGNOSIS — I5042 Chronic combined systolic (congestive) and diastolic (congestive) heart failure: Secondary | ICD-10-CM

## 2021-08-03 DIAGNOSIS — Z95810 Presence of automatic (implantable) cardiac defibrillator: Secondary | ICD-10-CM

## 2021-08-07 NOTE — Progress Notes (Signed)
EPIC Encounter for ICM Monitoring ? ?Patient Name: Anna Beltran is a 71 y.o. female ?Date: 08/07/2021 ?Primary Care Physican: Rebecka Apley, NP ?Primary Cardiologist: Jens Som ?Electrophysiologist: Allred ?06/30/2021 Office Weight: 222 lbs  ?                                              ?Attempted call to patient and unable to reach.  Left detailed message per DPR regarding transmission. Transmission reviewed.  ?  ?Optivol thoracic impedance normal but was suggesting intermittent days with possible fluid accumulation in the past month. ?  ?Prescribed:  ?Furosemide 20 mg take 2 tablets (40 mg total) by mouth daily. ?Spironolactone 25 mg take 1 tablet daily ?  ?Labs: ?07/24/2021 Creatinine 0.74, BUN 15, Potassium 4.7, Sodium 140 ?07/07/2021 Creatinine 0.87, BUN 18, Potassium 4.2, Sodium 144  ?A complete set of results can be found in Results Review. ?  ?Recommendations:  Left voice mail with ICM number and encouraged to call if experiencing any fluid symptoms. ?  ?Follow-up plan: ICM clinic phone appointment on 09/07/2021.   91 day device clinic remote transmission 10/13/2021.   ?  ?EP/Cardiology Office Visits:   Recall 09/08/2020 with Dr Johney Frame.   ?  ?Copy of ICM check sent to Dr. Johney Frame.  ? ?3 month ICM trend: 08/03/2021. ? ? ? ?12-14 Month ICM trend:  ? ? ? ?[name], RN ?08/07/2021 ?1:19 PM ? ?

## 2021-08-13 ENCOUNTER — Ambulatory Visit: Payer: Medicare HMO | Admitting: Cardiology

## 2021-08-18 NOTE — Progress Notes (Signed)
?Cardiology Office Note:   ? ?Date:  08/26/2021  ? ?ID:  Anna Beltran, DOB [birth year], MRN [id number] ? ?PCP:  Rebecka Apley, NP  ?Cardiologist:  Olga Millers, MD  ?Electrophysiologist:  Hillis Range, MD  ? ?Referring MD: Rebecka Apley, NP  ? ?Chief Complaint: follow-up of CHF ? ?History of Present Illness:   ? ?Anna Beltran is a 71 y.o. female with a history of non-obstructive CAD on cardiac catheterization in 2014, non-ischemic cardiomyopathy/ chronic combined CHF with EF as low as 25-30% in the past but improved to 40% on last Echo in 2021-03-15, s/p ICD in 01/2014 for primary prevention, chronic hypoxic respiratory failure on home O2 at night and as needed, asthma,  mild left carotid stenosis (<39%) on dopplers in 2014, hypertension, hyperlipidemia, type 2 diabetes mellitus, PUD, and obesity who is followed by Dr. Jens Som and presents today for follow-up of CHF. ? ?Patient has a long history of CHF with EF as low as 25-30% in 11/2012. Cardiac catheterization in 2013/03/15 showed mild non-obstructive CAD with normal right heart pressures. Medical therapy was recommended at that time. She ultimately underwent placement of Medtronic ICD in 01/2014 for primary prevention of sudden cardiac death. Last Echo in 03/15/2021 showed LVEF of 40% with global hypokinesis, moderate LVH, and grade 1 diastolic dysfunction as well as mildly enlarged RV with mildly reduced systolic function and moderately elevated PASP. Also showed moderate tricuspid regurgitation and moderate pulmonic regurgitation.  ? ?Patient was last seen by Dr. Jens Som in 06/2021 at which time she was recovering from recent sinus infection. Symptoms improved some with antibiotics but she continued to have dyspnea on exertion and possible orthopnea. She also reported some bradycardia with heart rate dropping as low as 25 bpm at one point. Pro BNP was checked and came back elevated at 699. Therefore, she was started on Lasix. 3-day Zio monitor was also  ordered for further evaluation of bradycardia. Monitor showed sinus rhythm with average heart rate of 66 bpm (min 50 bpm and max 95 bpm) with rare PACs and PVCs. ? ?Patient presents today for follow-up. Here alone. Her O2 sats were in the 70s when she came in and improved to 94% when we placed her on 2 L of O2. She has O2 at home that she wears at night and as needed.  However, it sounds like she does not wear this as she should and states her O2 sats are often in the 70s especially when ambulating to the bathroom.  Her O2 is managed by Dr. Craige Cotta (Pulmonology). She reports persistent shortness of breath and states it is no better and no worse since last visit with Dr. Jens Som.  She has stable orthopnea which she has had for years. No PND. She has chronic lower extremity edema which is stable. She has not been compliant with her Lasix. She states she only takes it 3-4 times per week and has not taken it in the last 2-3 days. She is also not following a low sodium diet. Weight is up 6lbs from last visit. She denies any chest pain. No palpitations. She states her heart rate does continue to drop as low as the 20s on her pulse ox machine when ambulating to the bathroom. O2 sats also drop to the 70s to 80s at this time. Reviewed recent heart monitor and discussed low heart rate may be due to uncaptured PVCs/PACs or hypoxia. Discussed the importance of using her home O2 when ambulating. She reports some lightheadedness/dizziness when  ambulating to the bathroom but I think this is also related to her hypoxia. No syncope.  ? ?Past Medical History:  ?Diagnosis Date  ? Automatic implantable cardioverter-defibrillator in situ 01/17/2014  ? BY DR Johney Frame  ? Bruit   ? Bilat carotid duplex 09/05/12 CONCLUSION: Mild, less than or equal to 39%, left internal carotid artery stenosis.  ? CAD (coronary artery disease)   ? non-obstructive CAD on LHC in 2014  ? Chronic combined systolic and diastolic CHF (congestive heart failure) ([facility])    ? MPI 09/05/12 NORMAL, showed no scar & LVEF is 20-25%. ECHO 12/11/12 LV is markedly dilated, Overall LV systolic function severely impaired with EF = 25-30%, pseudonormal LV filling pattern (consistant with elevated LA pressure).  ? Diabetes mellitus, type 2 ([facility])   ? TYPE 2  ? Essential hypertension, benign   ? Hyperlipidemia, mixed   ? Hypersomnia, unspecified   ? Hypertensive cardiomyopathy ([facility])   ? By report there is poor compliance w/treatment, poor tolerance of treatment & poor symptom control. Symptoms include dyspnea & DOE.  ? Non-ischemic cardiomyopathy ([facility])   ? Obesity   ? PUD (peptic ulcer disease)   ? Unspecified menopausal and postmenopausal disorder   ? ? ?Past Surgical History:  ?Procedure Laterality Date  ? CARDIAC DEFIBRILLATOR PLACEMENT  01/17/2014  ? DR Johney Frame  ? EXCISION OF SKIN TAG N/A 12/01/2018  ? Procedure: EXCISION OF SKIN TAG ANAL;  Surgeon: Romie Levee, MD;  Location: [facility] ORS;  Service: General;  Laterality: N/A;  ? IMPLANTABLE CARDIOVERTER DEFIBRILLATOR IMPLANT N/A 01/17/2014  ? Procedure: IMPLANTABLE CARDIOVERTER DEFIBRILLATOR IMPLANT;  Surgeon: Gardiner Rhyme, MD;  Location: [facility] CATH LAB;  Service: Cardiovascular;  Laterality: N/A;  ? LEFT AND RIGHT HEART CATHETERIZATION WITH CORONARY ANGIOGRAM N/A 02/27/2013  ? Procedure: LEFT AND RIGHT HEART CATHETERIZATION WITH CORONARY ANGIOGRAM;  Surgeon: Rollene Rotunda, MD;  Location: [facility] CATH LAB;  Service: Cardiovascular;  Laterality: N/A;  ? none    ? ? ?Current Medications: ?Current Meds  ?Medication Sig  ? albuterol (VENTOLIN HFA) 108 (90 Base) MCG/ACT inhaler Inhale 2 puffs into the lungs every 6 (six) hours as needed for wheezing or shortness of breath.  ? amLODipine (NORVASC) 5 MG tablet Take 1 tablet by mouth once daily  ? aspirin 81 MG tablet Take 81 mg by mouth daily.  ? carvedilol (COREG) 25 MG tablet TAKE 1 TABLET BY MOUTH TWICE DAILY WITH MEALS  ? furosemide (LASIX) 20 MG tablet Take 2 tablets (40 mg total) by mouth daily.  ?  glipiZIDE (GLUCOTROL) 5 MG tablet Take 5 mg by mouth 2 (two) times daily before a meal.   ? hydrALAZINE (APRESOLINE) 100 MG tablet TAKE 1 TABLET BY MOUTH THREE TIMES DAILY  ? metFORMIN (GLUCOPHAGE) 1000 MG tablet Take 1,000 mg by mouth 2 (two) times daily with a meal.   ? omeprazole (PRILOSEC) 20 MG capsule Take 1 capsule by mouth twice daily  ? Polyvinyl Alcohol-Povidone (REFRESH OP) Place 1 drop into both eyes 2 (two) times daily as needed (for dry eyes).   ? sacubitril-valsartan (ENTRESTO) 97-103 MG Take 1 tablet by mouth 2 (two) times daily.  ?  ? ?Allergies:   Isosorbide nitrate  ? ?Social History  ? ?Socioeconomic History  ? Marital status: Married  ?  Spouse name: Not on file  ? Number of children: 5  ? Years of education: Not on file  ? Highest education level: Not on file  ?Occupational History  ?  Comment: Retired  ?Tobacco  Use  ? Smoking status: Never  ? Smokeless tobacco: Never  ?Vaping Use  ? Vaping Use: Never used  ?Substance and Sexual Activity  ? Alcohol use: No  ? Drug use: No  ? Sexual activity: Never  ?Other Topics Concern  ? Not on file  ?Social History Narrative  ? Lives in [place] with husband and son.  Retired from Designer, fashion/clothing  ? ?Social Determinants of Health  ? ?Financial Resource Strain: Not on file  ?Food Insecurity: Not on file  ?Transportation Needs: Not on file  ?Physical Activity: Not on file  ?Stress: Not on file  ?Social Connections: Not on file  ?  ? ?Family History: ?The patient's family history includes Cancer in her brother; Diabetes Mellitus II in her mother; Hypertension in her father. ? ?ROS:   ?Please see the history of present illness.    ? ?EKGs/Labs/Other Studies Reviewed:   ? ?The following studies were reviewed today: ? ?Echocardiogram 03/02/2021: ?Impressions: ? 1. Left ventricular ejection fraction, by estimation, is 40%. The left  ?ventricle has mildly decreased function. The left ventricle demonstrates  ?global hypokinesis. There is moderate left ventricular  hypertrophy. Left  ?ventricular diastolic parameters are  ? consistent with Grade I diastolic dysfunction (impaired relaxation).  ? 2. Right ventricular systolic function is mildly reduced. The right  ?ventricular size i

## 2021-08-24 ENCOUNTER — Encounter: Payer: Self-pay | Admitting: Student

## 2021-08-26 ENCOUNTER — Telehealth: Payer: Self-pay

## 2021-08-26 ENCOUNTER — Encounter: Payer: Self-pay | Admitting: Student

## 2021-08-26 ENCOUNTER — Ambulatory Visit: Payer: Medicare HMO | Admitting: Student

## 2021-08-26 VITALS — BP 114/50 | HR 67 | Ht 64.0 in | Wt 228.0 lb

## 2021-08-26 DIAGNOSIS — I2510 Atherosclerotic heart disease of native coronary artery without angina pectoris: Secondary | ICD-10-CM | POA: Diagnosis not present

## 2021-08-26 DIAGNOSIS — I5042 Chronic combined systolic (congestive) and diastolic (congestive) heart failure: Secondary | ICD-10-CM | POA: Diagnosis not present

## 2021-08-26 DIAGNOSIS — E785 Hyperlipidemia, unspecified: Secondary | ICD-10-CM

## 2021-08-26 DIAGNOSIS — I428 Other cardiomyopathies: Secondary | ICD-10-CM

## 2021-08-26 DIAGNOSIS — Z95810 Presence of automatic (implantable) cardiac defibrillator: Secondary | ICD-10-CM | POA: Diagnosis not present

## 2021-08-26 DIAGNOSIS — I10 Essential (primary) hypertension: Secondary | ICD-10-CM

## 2021-08-26 DIAGNOSIS — E118 Type 2 diabetes mellitus with unspecified complications: Secondary | ICD-10-CM

## 2021-08-26 LAB — BASIC METABOLIC PANEL
BUN/Creatinine Ratio: 19 (ref 12–28)
BUN: 17 mg/dL (ref 8–27)
CO2: 20 mmol/L (ref 20–29)
Calcium: 10 mg/dL (ref 8.7–10.3)
Chloride: 110 mmol/L — ABNORMAL HIGH (ref 96–106)
Creatinine, Ser: 0.89 mg/dL (ref 0.57–1.00)
Glucose: 90 mg/dL (ref 70–99)
Potassium: 4.4 mmol/L (ref 3.5–5.2)
Sodium: 144 mmol/L (ref 134–144)
eGFR: 69 mL/min/{1.73_m2} (ref >59)

## 2021-08-26 NOTE — Patient Instructions (Signed)
Medication Instructions:  ?Restart Lasix 40 mg ( Take 1 Tablet Daily). ?*If you need a refill on your cardiac medications before your next appointment, please call your pharmacy* ? ? ?Lab Work: ?BNP, BMET. Today ?If you have labs (blood work) drawn today and your tests are completely normal, you will receive your results only by: ?MyChart Message (if you have MyChart) OR ?A paper copy in the mail ?If you have any lab test that is abnormal or we need to change your treatment, we will call you to review the results. ? ? ?Testing/Procedures: ?None ? ? ?Follow-Up: ?At [facility], you and your health needs are our priority.  As part of our continuing mission to provide you with exceptional heart care, we have created designated Provider Care Teams.  These Care Teams include your primary Cardiologist (physician) and Advanced Practice Providers (APPs -  Physician Assistants and Nurse Practitioners) who all work together to provide you with the care you need, when you need it. ? ?We recommend signing up for the patient portal called "MyChart".  Sign up information is provided on this After Visit Summary.  MyChart is used to connect with patients for Virtual Visits (Telemedicine).  Patients are able to view lab/test results, encounter notes, upcoming appointments, etc.  Non-urgent messages can be sent to your provider as well.   ?To learn more about what you can do with MyChart, go to [email or web address].   ? ?Your next appointment:   ?3 week(s) ? ?The format for your next appointment:   ?In Person ? ?Provider:   ?Sande Rives, PA-C    Then, Kirk Ruths, MD will plan to see you again in 3 month(s).  ? ? ?Other Instructions ?Recommended using Oxygen when walking. Please follow up with Pulmonology. ?Heart Failure Education: ? ?Weigh yourself EVERY morning after you go to the bathroom but before you eat or drink anything. Write this number down in a weight log/diary. If you gain 3 pounds overnight or 5 pounds in a  week, call the office. ?Take your medicines as prescribed. If you have concerns about your medications, please call us before you stop taking them. ?Eat low salt foods--Limit salt (sodium) to 2000 mg per day. This will help prevent your body from holding onto fluid. Read food labels as many processed foods have a lot of sodium, especially canned goods and prepackaged meats. If you would like some assistance choosing low sodium foods, we would be happy to set you up with a nutritionist. ?Limit all fluids for the day to less than 2 liters (64 ounces). Fluid includes all drinks, coffee, juice, ice chips, soup, jello, and all other liquids. ?Stay as active as you can everyday. Staying active will give you more energy and make your muscles stronger. Start with 5 minutes at a time and work your way up to 30 minutes a day. Break up your activities--do some in the morning and some in the afternoon. Start with 3 days per week and work your way up to 5 days as you can.  If you have chest pain, feel short of breath, dizzy, or lightheaded, STOP. If you don't feel better after a short rest, call 911. If you do feel better, call the office to let us know you have symptoms with exercise. ?  ? ?Important Information About Sugar ? ? ? ? ?  ?

## 2021-08-26 NOTE — Telephone Encounter (Signed)
Called patient to advise to return to repeat Bnp blood Test. Lab slip mailed to patient. ?

## 2021-08-27 ENCOUNTER — Other Ambulatory Visit: Payer: Self-pay

## 2021-08-27 DIAGNOSIS — I5042 Chronic combined systolic (congestive) and diastolic (congestive) heart failure: Secondary | ICD-10-CM

## 2021-08-27 NOTE — Progress Notes (Signed)
This encounter was created in error - please disregard.

## 2021-09-03 ENCOUNTER — Other Ambulatory Visit: Payer: Medicare HMO

## 2021-09-03 LAB — BASIC METABOLIC PANEL
BUN/Creatinine Ratio: 23 (ref 12–28)
BUN: 20 mg/dL (ref 8–27)
CO2: 23 mmol/L (ref 20–29)
Calcium: 9.8 mg/dL (ref 8.7–10.3)
Chloride: 102 mmol/L (ref 96–106)
Creatinine, Ser: 0.86 mg/dL (ref 0.57–1.00)
Glucose: 95 mg/dL (ref 70–99)
Potassium: 4.2 mmol/L (ref 3.5–5.2)
Sodium: 140 mmol/L (ref 134–144)
eGFR: 72 mL/min/{1.73_m2} (ref >59)

## 2021-09-04 LAB — BRAIN NATRIURETIC PEPTIDE: BNP: 154.3 pg/mL — ABNORMAL HIGH (ref 0.0–100.0)

## 2021-09-09 ENCOUNTER — Telehealth: Payer: Self-pay

## 2021-09-09 NOTE — Telephone Encounter (Signed)
LMOVM to send missed ICM transmission.  

## 2021-09-10 NOTE — Progress Notes (Signed)
No ICM remote transmission received for 09/07/2021 and next ICM transmission scheduled for 09/21/2021.   ?

## 2021-09-13 NOTE — Progress Notes (Signed)
?Cardiology Office Note:   ? ?Date:  09/16/2021  ? ?ID:  Anna Beltran, DOB 03-22-1951, MRN [id number] ? ?PCP:  Rebecka Apley, NP  ?Cardiologist:  Olga Millers, MD  ?Electrophysiologist:  Hillis Range, MD  ? ?Referring MD: Rebecka Apley, NP  ? ?Chief Complaint: follow-up of CHF ? ?History of Present Illness:   ? ?Anna Beltran is a 71 y.o. female with a history of non-obstructive CAD on cardiac catheterization in 2014, non-ischemic cardiomyopathy/ chronic combined CHF with EF as low as 25-30% in the past but improved to 40% on last Echo in 14-Mar-2021, s/p ICD in 01/2014 for primary prevention, chronic hypoxic respiratory failure on home O2 at night and as needed, asthma, mild left carotid stenosis (<39%) on dopplers in 2014, hypertension, hyperlipidemia, type 2 diabetes mellitus, PUD, and obesity who is followed by Dr. Jens Som and presents today for follow-up of CHF. ?  ?Patient has a long history of CHF with EF as low as 25-30% in 11/2012. Cardiac catheterization in Mar 14, 2013 showed mild non-obstructive CAD with normal right heart pressures. Medical therapy was recommended at that time. She ultimately underwent placement of Med[employer] ICD in 01/2014 for primary prevention of sudden cardiac death. Last Echo in 2021/03/14 showed LVEF of 40% with global hypokinesis, moderate LVH, and grade 1 diastolic dysfunction as well as mildly enlarged RV with mildly reduced systolic function and moderately elevated PASP. Also showed moderate tricuspid regurgitation and moderate pulmonic regurgitation. Monitor was ordered in 06/2021 for further evaluation of self reported bradycardia at home and showed underlying sinus rhythm with average heart rate of 66 bpm (min 50 bpm and max 95 bpm) with rare PACs and PVCs. ?  ?Patient was last seen by me on 08/26/2021 at which time her O2s were in the high 70s when she first came in to the office and improved to 94% when we placed her on O2. She has O2 to use at night and as needed but often  does not use it with ambulating. She reported persistent dyspnea on exertion and weight was up 6 lbs from prior visit in 06/2021 but she was not taking her Lasix as directed. She also continued to report episodes of bradycardia with rates dropping down as low as the 20s when ambulating to the bathroom. However, with recent normal monitor results this was felt to be due to uncaptured PACs/PVCs or hypoxia given she does not wear her O2 when ambulating to the bathroom. She was encouraged to take her Lasix every day as prescribed and wear her home O2 when ambulating. She was also encouraged to follow-up with Pulmonology. ? ?Patient presents today for follow-up. Here alone. Patient was again hypoxic on arrival to our office. O2 sats in the high 60s to 70s but quickly improved to the high 80s to low 90s on 2L of O2. She has O2 in her car but did not wear it into the office. She notes some mild improvement in her breathing after taking Lasix every day. She has chronic stable orthopnea which she has had for years. No PND. Her lower extremity edema has improved with daily Lasix. Her weight is down 7lbs from last office visit. She is weighing herself daily and states it is staying around 222 lbs at home. She is trying to watch her sodium intake but states she struggles with this. She denies any chest pain. She notes rare palpitations. She continues to have some bradycardia noted on home pulses Ox machine when she ambulates to the  bathroom without wearing her O2. Suspect this is due to hypoxia. She notes some lightheadedness/dizziness when ambulating without her O2 but no syncope. She does report a mild productive cough for the past 2 weeks and states she has been coughing up clear phlegm.  ? ?Past Medical History:  ?Diagnosis Date  ? Automatic implantable cardioverter-defibrillator in situ 01/17/2014  ? BY DR Johney Frame  ? CAD (coronary artery disease)   ? non-obstructive CAD on LHC in 2014  ? Carotid stenosis   ? Bilat carotid  duplex 09/05/12 CONCLUSION: Mild, less than or equal to 39%, left internal carotid artery stenosis.  ? Chronic combined systolic and diastolic CHF (congestive heart failure) (HCC)   ? MPI 09/05/12 NORMAL, showed no scar & LVEF is 20-25%. ECHO 12/11/12 LV is markedly dilated, Overall LV systolic function severely impaired with EF = 25-30%, pseudonormal LV filling pattern (consistant with elevated LA pressure).  ? Chronic respiratory failure (HCC)   ? on supplemental O2 at night and as needed  ? Diabetes mellitus, type 2 (HCC)   ? TYPE 2  ? Essential hypertension, benign   ? Hyperlipidemia, mixed   ? Hypersomnia, unspecified   ? Non-ischemic cardiomyopathy (HCC)   ? Obesity   ? PUD (peptic ulcer disease)   ? Unspecified menopausal and postmenopausal disorder   ? ? ?Past Surgical History:  ?Procedure Laterality Date  ? CARDIAC DEFIBRILLATOR PLACEMENT  01/17/2014  ? DR Johney Frame  ? EXCISION OF SKIN TAG N/A 12/01/2018  ? Procedure: EXCISION OF SKIN TAG ANAL;  Surgeon: Romie Levee, MD;  Location: [facility] ORS;  Service: General;  Laterality: N/A;  ? IMPLANTABLE CARDIOVERTER DEFIBRILLATOR IMPLANT N/A 01/17/2014  ? Procedure: IMPLANTABLE CARDIOVERTER DEFIBRILLATOR IMPLANT;  Surgeon: Gardiner Rhyme, MD;  Location: [facility] CATH LAB;  Service: Cardiovascular;  Laterality: N/A;  ? LEFT AND RIGHT HEART CATHETERIZATION WITH CORONARY ANGIOGRAM N/A 02/27/2013  ? Procedure: LEFT AND RIGHT HEART CATHETERIZATION WITH CORONARY ANGIOGRAM;  Surgeon: Rollene Rotunda, MD;  Location: [facility] CATH LAB;  Service: Cardiovascular;  Laterality: N/A;  ? none    ? ? ?Current Medications: ?Current Meds  ?Medication Sig  ? albuterol (VENTOLIN HFA) 108 (90 Base) MCG/ACT inhaler Inhale 2 puffs into the lungs every 6 (six) hours as needed for wheezing or shortness of breath.  ? amLODipine (NORVASC) 5 MG tablet Take 1 tablet by mouth once daily  ? aspirin 81 MG tablet Take 81 mg by mouth daily.  ? carvedilol (COREG) 25 MG tablet TAKE 1 TABLET BY MOUTH TWICE DAILY WITH MEALS   ? furosemide (LASIX) 20 MG tablet Take 2 tablets (40 mg total) by mouth daily.  ? glipiZIDE (GLUCOTROL) 5 MG tablet Take 5 mg by mouth 2 (two) times daily before a meal.   ? hydrALAZINE (APRESOLINE) 100 MG tablet TAKE 1 TABLET BY MOUTH THREE TIMES DAILY  ? metFORMIN (GLUCOPHAGE) 1000 MG tablet Take 1,000 mg by mouth 2 (two) times daily with a meal.   ? omeprazole (PRILOSEC) 20 MG capsule Take 1 capsule by mouth twice daily  ? Polyvinyl Alcohol-Povidone (REFRESH OP) Place 1 drop into both eyes 2 (two) times daily as needed (for dry eyes).   ? sacubitril-valsartan (ENTRESTO) 97-103 MG Take 1 tablet by mouth 2 (two) times daily.  ? spironolactone (ALDACTONE) 25 MG tablet Take 1 tablet (25 mg total) by mouth daily.  ?  ? ?Allergies:   Isosorbide nitrate  ? ?Social History  ? ?Socioeconomic History  ? Marital status: Married  ?  Spouse name: Not  on file  ? Number of children: 5  ? Years of education: Not on file  ? Highest education level: Not on file  ?Occupational History  ?  Comment: Retired  ?Tobacco Use  ? Smoking status: Never  ? Smokeless tobacco: Never  ?Vaping Use  ? Vaping Use: Never used  ?Substance and Sexual Activity  ? Alcohol use: No  ? Drug use: No  ? Sexual activity: Never  ?Other Topics Concern  ? Not on file  ?Social History Narrative  ? Lives in [place] with husband and son.  Retired from [employer], fashion/clothing  ? ?Social Determinants of Health  ? ?Financial Resource Strain: Not on file  ?Food Insecurity: Not on file  ?Transportation Needs: Not on file  ?Physical Activity: Not on file  ?Stress: Not on file  ?Social Connections: Not on file  ?  ? ?Family History: ?The patient's family history includes Cancer in her brother; Diabetes Mellitus II in her mother; Hypertension in her father. ? ?ROS:   ?Please see the history of present illness.    ? ?EKGs/Labs/Other Studies Reviewed:   ? ?The following studies were reviewed today: ? ?Echocardiogram 03/02/2021: ?Impressions: ? 1. Left ventricular ejection fraction, by  estimation, is 40%. The left  ?ventricle has mildly decreased function. The left ventricle demonstrates  ?global hypokinesis. There is moderate left ventricular hypertrophy. Left  ?ventricular diastolic

## 2021-09-15 ENCOUNTER — Encounter: Payer: Self-pay | Admitting: Student

## 2021-09-15 DIAGNOSIS — J9610 Chronic respiratory failure, unspecified whether with hypoxia or hypercapnia: Secondary | ICD-10-CM | POA: Insufficient documentation

## 2021-09-15 DIAGNOSIS — I6529 Occlusion and stenosis of unspecified carotid artery: Secondary | ICD-10-CM | POA: Insufficient documentation

## 2021-09-16 ENCOUNTER — Ambulatory Visit (INDEPENDENT_AMBULATORY_CARE_PROVIDER_SITE_OTHER): Payer: Medicare HMO | Admitting: Student

## 2021-09-16 ENCOUNTER — Encounter: Payer: Self-pay | Admitting: Student

## 2021-09-16 VITALS — BP 108/52 | HR 80 | Ht 63.5 in | Wt 221.0 lb

## 2021-09-16 DIAGNOSIS — I428 Other cardiomyopathies: Secondary | ICD-10-CM

## 2021-09-16 DIAGNOSIS — J9611 Chronic respiratory failure with hypoxia: Secondary | ICD-10-CM

## 2021-09-16 DIAGNOSIS — Z95810 Presence of automatic (implantable) cardiac defibrillator: Secondary | ICD-10-CM | POA: Diagnosis not present

## 2021-09-16 DIAGNOSIS — E118 Type 2 diabetes mellitus with unspecified complications: Secondary | ICD-10-CM

## 2021-09-16 DIAGNOSIS — I5042 Chronic combined systolic (congestive) and diastolic (congestive) heart failure: Secondary | ICD-10-CM

## 2021-09-16 DIAGNOSIS — I6523 Occlusion and stenosis of bilateral carotid arteries: Secondary | ICD-10-CM

## 2021-09-16 DIAGNOSIS — I2510 Atherosclerotic heart disease of native coronary artery without angina pectoris: Secondary | ICD-10-CM

## 2021-09-16 DIAGNOSIS — I10 Essential (primary) hypertension: Secondary | ICD-10-CM

## 2021-09-16 DIAGNOSIS — E785 Hyperlipidemia, unspecified: Secondary | ICD-10-CM

## 2021-09-16 NOTE — Patient Instructions (Signed)
Medication Instructions:  ?Your Physician recommend you continue on your current medication as directed.   ? ?*If you need a refill on your cardiac medications before your next appointment, please call your pharmacy* ? ?Testing/Procedures: ?Chest X-ray 2 view ? ?Follow-Up: ?At [facility], you and your health needs are our priority.  As part of our continuing mission to provide you with exceptional heart care, we have created designated Provider Care Teams.  These Care Teams include your primary Cardiologist (physician) and Advanced Practice Providers (APPs -  Physician Assistants and Nurse Practitioners) who all work together to provide you with the care you need, when you need it. ? ?We recommend signing up for the patient portal called "MyChart".  Sign up information is provided on this After Visit Summary.  MyChart is used to connect with patients for Virtual Visits (Telemedicine).  Patients are able to view lab/test results, encounter notes, upcoming appointments, etc.  Non-urgent messages can be sent to your provider as well.   ?To learn more about what you can do with MyChart, go to [email or web address].   ? ?Your next appointment:   ?3 month(s) ? ?The format for your next appointment:   ?In Person ? ?Provider:   ?Olga Millers, MD  ? ? ?Other Instructions ?Follow up with your pulmonologist ?Use your oxygen any time you are walking ? ?

## 2021-09-21 ENCOUNTER — Ambulatory Visit (INDEPENDENT_AMBULATORY_CARE_PROVIDER_SITE_OTHER): Payer: Medicare HMO

## 2021-09-21 DIAGNOSIS — I5042 Chronic combined systolic (congestive) and diastolic (congestive) heart failure: Secondary | ICD-10-CM | POA: Diagnosis not present

## 2021-09-21 DIAGNOSIS — Z95810 Presence of automatic (implantable) cardiac defibrillator: Secondary | ICD-10-CM

## 2021-09-24 ENCOUNTER — Telehealth: Payer: Self-pay

## 2021-09-24 NOTE — Telephone Encounter (Signed)
Remote ICM transmission received.  Attempted call to patient regarding ICM remote transmission and left detailed message per DPR.  Advised to return call for any fluid symptoms or questions. Next ICM remote transmission scheduled 10/26/2021.   ? ?

## 2021-09-24 NOTE — Progress Notes (Signed)
EPIC Encounter for ICM Monitoring ? ?Patient Name: Anna Beltran is a 71 y.o. female ?Date: 09/24/2021 ?Primary Care Physican: Rebecka Apley, NP ?Primary Cardiologist: Jens Som ?Electrophysiologist: Allred ?06/30/2021 Office Weight: 222 lbs  ?                                              ?Attempted call to patient and unable to reach.  Left detailed message per DPR regarding transmission. Transmission reviewed.  ?  ?Optivol thoracic impedance suggesting normal fluid levels. ?  ?Prescribed:  ?Furosemide 20 mg take 2 tablets (40 mg total) by mouth daily. ?Spironolactone 25 mg take 1 tablet daily ?  ?Labs: ?07/24/2021 Creatinine 0.74, BUN 15, Potassium 4.7, Sodium 140 ?07/07/2021 Creatinine 0.87, BUN 18, Potassium 4.2, Sodium 144  ?A complete set of results can be found in Results Review. ?  ?Recommendations:  Left voice mail with ICM number and encouraged to call if experiencing any fluid symptoms. ?  ?Follow-up plan: ICM clinic phone appointment on 10/26/2021.   91 day device clinic remote transmission 10/13/2021.   ?  ?EP/Cardiology Office Visits:   Recall 09/08/2020 with Dr Johney Frame.   ?  ?Copy of ICM check sent to Dr. Johney Frame.  ? ?3 month ICM trend: 09/21/2021. ? ? ? ?12-14 Month ICM trend:  ? ? ? ?[name], RN ?09/24/2021 ?10:56 AM ? ?

## 2021-10-13 ENCOUNTER — Ambulatory Visit (INDEPENDENT_AMBULATORY_CARE_PROVIDER_SITE_OTHER): Payer: Medicare HMO

## 2021-10-13 DIAGNOSIS — I5042 Chronic combined systolic (congestive) and diastolic (congestive) heart failure: Secondary | ICD-10-CM

## 2021-10-14 LAB — CUP PACEART REMOTE DEVICE CHECK
Battery Remaining Longevity: 42 mo
Battery Voltage: 2.97 V
Brady Statistic RV Percent Paced: 0.01 %
Date Time Interrogation Session: 20230530043625
HighPow Impedance: 57 Ohm
Implantable Lead Implant Date: 20150903
Implantable Lead Location: 753860
Implantable Lead Model: 6935
Implantable Pulse Generator Implant Date: 20150903
Lead Channel Impedance Value: 304 Ohm
Lead Channel Impedance Value: 399 Ohm
Lead Channel Pacing Threshold Amplitude: 1 V
Lead Channel Pacing Threshold Pulse Width: 0.4 ms
Lead Channel Sensing Intrinsic Amplitude: 16.125 mV
Lead Channel Sensing Intrinsic Amplitude: 16.125 mV
Lead Channel Setting Pacing Amplitude: 2.25 V
Lead Channel Setting Pacing Pulse Width: 0.4 ms
Lead Channel Setting Sensing Sensitivity: 0.3 mV

## 2021-10-21 ENCOUNTER — Encounter: Payer: Self-pay | Admitting: Pulmonary Disease

## 2021-10-21 ENCOUNTER — Ambulatory Visit: Payer: Medicare HMO | Admitting: Pulmonary Disease

## 2021-10-21 VITALS — BP 136/88 | HR 92 | Temp 98.0°F | Ht 64.0 in | Wt 224.8 lb

## 2021-10-21 DIAGNOSIS — R0609 Other forms of dyspnea: Secondary | ICD-10-CM | POA: Diagnosis not present

## 2021-10-21 DIAGNOSIS — J9611 Chronic respiratory failure with hypoxia: Secondary | ICD-10-CM | POA: Diagnosis not present

## 2021-10-21 NOTE — Progress Notes (Signed)
[facility]  Chief Complaint  Patient presents with   Follow-up    States her breathing has worsened,  Using 3-4Lo2 at home     Constitutional:  BP 136/88 (BP Location: Left Arm, Patient Position: Sitting)   Pulse 92   Temp 98 F (36.7 C) (Temporal)   Ht 5\' 4"  (1.626 m)   Wt 224 lb 12.8 oz (102 kg)   SpO2 (!) 86% Comment: ra  BMI 38.59 kg/m   Past Medical History:  Systolic CHF with non ischemic CM, s/p AICD, DM, HTN, HLD, PUD  Past Surgical History:  She  has a past surgical history that includes none; Cardiac defibrillator placement (01/17/2014); left and right heart catheterization with coronary angiogram (N/A, 02/27/2013); implantable cardioverter defibrillator implant (N/A, 01/17/2014); and Excision of skin tag (N/A, 12/01/2018).  Brief Summary:  Anna Beltran is a 71 y.o. female with dyspnea from asthma, systolic CHF, and chronic hypoxic respiratory failure.      Subjective:   She saw Tammy Parrett in February for sinus infection.  Sinuses better now.  She tried getting a POC, but was told by DME that because she has sinus congestion she couldn't use a POC.  Her oxygen tanks are too heavy for her to carry.  As a result she only uses her stationary concentrator when at home.    She gets winded with minimal activity.  She is not having cough, wheeze, or sputum.  Sleeps okay, and not aware of dyspnea or snoring at night.  She uses flovent intermittently.  SpO2 at rest on room air today was 85%.  She was recovered during ambulation on 3 liters oxygen with SpO2 staying above 90%.  Physical Exam:   Appearance - well kempt   ENMT - no sinus tenderness, no oral exudate, no LAN, Mallampati 3 airway, no stridor, 2+ tonsils  Respiratory - faint basilar crackles  CV - s1s2 regular rate and rhythm, no murmurs  Ext - no clubbing, no edema  Skin - no rashes  Psych - normal mood and affect    Pulmonary testing:  PFT 07/26/18  >> FEV1 1.14 (61%), FEV1% 79, TLC 4.19 (82%), RV:TLC 141, DLCO 67%, +BD from FEF 25-75  Sleep Tests:  ONO with RA 11/26/19 >> test time 5 hrs 56 min.  Baseline SpO2 81%, SpO2 low 70%.  Spent 5 hr 56 min with SpO2 < 88%.  Cardiac Tests:  Echo 03/02/21 >> EF 40%, mod LVH, grade 1 DD, RVSP 54.3 mmHg, mod TR and PR  Social History:  She  reports that she has never smoked. She has never used smokeless tobacco. She reports that she does not drink alcohol and does not use drugs.  Family History:  Her family history includes Cancer in her brother; Diabetes Mellitus II in her mother; Hypertension in her father.     Assessment/Plan:   Dyspnea on exertion. - mostly from hypoxia, congestive heart failure and deconditioning - she has more prominent basilar crackles with lung auscultation; will get a chest xray and then decide if additional assessment needed  Mild, intermittent asthma. - not having symptoms - prn flovent, ventolin   Chronic respiratory failure with hypoxia. - goal SpO2 > 90% - will see if she can get a portable oxygen concentrator - can used 3 liters pulsed oxygen during the day, and 3 liters continuous flow at night   Chronic systolic CHF from non ischemic cardiomyopathy, s/p AICD. - followed by Dr. 03/04/21 and  Dr. Johney Frame with cardiology  Time Spent Involved in Patient Care on Day of Examination:  35 minutes  Follow up:   Patient Instructions  Will schedule chest xray at [facility]  Will arrange for a portable oxygen concentrator.  Your goal is to keep your oxygen level above 90%.  Follow up in 2 to 3 months.  Medication List:   Allergies as of 10/21/2021       Reactions   Isosorbide Nitrate Other (See Comments)   Very bad headaches        Medication List        Accurate as of October 21, 2021 10:13 AM. If you have any questions, ask your nurse or doctor.          albuterol 108 (90 Base) MCG/ACT inhaler Commonly known as: VENTOLIN  HFA Inhale 2 puffs into the lungs every 6 (six) hours as needed for wheezing or shortness of breath.   amLODipine 5 MG tablet Commonly known as: NORVASC Take 1 tablet by mouth once daily   aspirin 81 MG tablet Take 81 mg by mouth daily.   carvedilol 25 MG tablet Commonly known as: COREG TAKE 1 TABLET BY MOUTH TWICE DAILY WITH MEALS   Entresto 97-103 MG Generic drug: sacubitril-valsartan Take 1 tablet by mouth 2 (two) times daily.   fluticasone 110 MCG/ACT inhaler Commonly known as: Flovent HFA Inhale 2 puffs into the lungs 2 (two) times daily.   furosemide 20 MG tablet Commonly known as: LASIX Take 2 tablets (40 mg total) by mouth daily.   glipiZIDE 5 MG tablet Commonly known as: GLUCOTROL Take 5 mg by mouth 2 (two) times daily before a meal.   hydrALAZINE 100 MG tablet Commonly known as: APRESOLINE TAKE 1 TABLET BY MOUTH THREE TIMES DAILY   metFORMIN 1000 MG tablet Commonly known as: GLUCOPHAGE Take 1,000 mg by mouth 2 (two) times daily with a meal.   omeprazole 20 MG capsule Commonly known as: PRILOSEC Take 1 capsule by mouth twice daily   REFRESH OP Place 1 drop into both eyes 2 (two) times daily as needed (for dry eyes).   spironolactone 25 MG tablet Commonly known as: ALDACTONE Take 1 tablet (25 mg total) by mouth daily.        Signature:  Coralyn Helling, MD [facility]/Critical Care Pager - [phone] 10/21/2021, 10:13 AM

## 2021-10-21 NOTE — Patient Instructions (Signed)
Will schedule chest xray at [facility].  Will arrange for a portable oxygen concentrator.  Your goal is to keep your oxygen level above 90%.  Follow up in 2 to 3 months.

## 2021-10-22 ENCOUNTER — Ambulatory Visit (HOSPITAL_COMMUNITY)
Admission: RE | Admit: 2021-10-22 | Discharge: 2021-10-22 | Disposition: A | Discharge status: Home / Self Care | Payer: Medicare HMO | Source: Ambulatory Visit | Attending: Pulmonary Disease | Admitting: Pulmonary Disease

## 2021-10-22 DIAGNOSIS — R0609 Other forms of dyspnea: Secondary | ICD-10-CM | POA: Insufficient documentation

## 2021-10-26 ENCOUNTER — Ambulatory Visit (INDEPENDENT_AMBULATORY_CARE_PROVIDER_SITE_OTHER): Payer: Medicare HMO

## 2021-10-26 DIAGNOSIS — Z95810 Presence of automatic (implantable) cardiac defibrillator: Secondary | ICD-10-CM | POA: Diagnosis not present

## 2021-10-26 DIAGNOSIS — I5042 Chronic combined systolic (congestive) and diastolic (congestive) heart failure: Secondary | ICD-10-CM

## 2021-10-27 NOTE — Progress Notes (Signed)
EPIC Encounter for ICM Monitoring  Patient Name: Anna Beltran is a 71 y.o. female Date: 10/27/2021 Primary Care Physican: Bridget Hartshorn, NP Primary Cardiologist: Stanford Breed Electrophysiologist: Allred 10/27/2021 Weight: 224 lbs                                               Spoke with patient and heart failure questions reviewed.  Pt reports SOB and swelling in feet in the last week.   Discussed fluid and salt intake.  Encouraged to weigh daily and report 3 lbs weight gain overnight or 5 lbs within a week.   Optivol thoracic impedance suggesting possible fluid accumulation starting 5/13 but starting to trend back toward baseline.  Fluid index > normal threshold starting 5/29.   Prescribed:  Furosemide 20 mg take 2 tablets (40 mg total) by mouth daily. Spironolactone 25 mg take 1 tablet daily   Labs: 07/24/2021 Creatinine 0.74, BUN 15, Potassium 4.7, Sodium 140 07/07/2021 Creatinine 0.87, BUN 18, Potassium 4.2, Sodium 144  A complete set of results can be found in Results Review.   Recommendations:  Advised to take Furosemide 60 mg x 2 days and then return to 40 mg daily.  Advised to limit fluid intake to 64 oz and salt intake.    Follow-up plan: ICM clinic phone appointment on 10/30/2021 to recheck fluid levels.   91 day device clinic remote transmission 01/12/2022.     EP/Cardiology Office Visits:   Recall 09/08/2020 with Dr Rayann Heman (last OV 09/14/2019).     Copy of ICM check sent to Dr. Rayann Heman and Dr Stanford Breed as Juluis Rainier and review.   3 month ICM trend: 10/26/2021.    12-14 Month ICM trend:     Rosalene Billings, RN 10/27/2021 9:40 AM

## 2021-10-28 NOTE — Progress Notes (Signed)
Remote ICD transmission.   

## 2021-10-30 ENCOUNTER — Ambulatory Visit (INDEPENDENT_AMBULATORY_CARE_PROVIDER_SITE_OTHER): Payer: Medicare HMO

## 2021-10-30 DIAGNOSIS — I5042 Chronic combined systolic (congestive) and diastolic (congestive) heart failure: Secondary | ICD-10-CM

## 2021-10-30 DIAGNOSIS — Z95810 Presence of automatic (implantable) cardiac defibrillator: Secondary | ICD-10-CM

## 2021-10-30 NOTE — Progress Notes (Signed)
EPIC Encounter for ICM Monitoring  Patient Name: Anna Beltran is a 71 y.o. female Date: 10/30/2021 Primary Care Physican: Rebecka Apley, NP Primary Cardiologist: Jens Som Electrophysiologist: Allred 10/27/2021 Weight: 224 lbs 10/30/2021 Weight: 221 lbs (baseline 220-222 lbs)                                               Spoke with patient and heart failure questions reviewed.  Pt reports SOB and swelling in feet in the last week.   Discussed fluid and salt intake.  Encouraged to weigh daily and report 3 lbs weight gain overnight or 5 lbs within a week.   Optivol thoracic impedance suggesting possible fluid accumulation starting 5/13 but starting to trend back toward baseline.  Fluid index > normal threshold starting 5/29.   Prescribed:  Furosemide 20 mg take 2 tablets (40 mg total) by mouth daily. Spironolactone 25 mg take 1 tablet daily   Labs: 07/24/2021 Creatinine 0.74, BUN 15, Potassium 4.7, Sodium 140 07/07/2021 Creatinine 0.87, BUN 18, Potassium 4.2, Sodium 144  A complete set of results can be found in Results Review.   Recommendations:  Recommendation to limit salt intake.  Encouraged to call if experiencing any fluid symptoms.    Follow-up plan: ICM clinic phone appointment on 11/30/2021.   91 day device clinic remote transmission 01/12/2022.     EP/Cardiology Office Visits:   Recall 09/08/2020 with Dr Johney Frame (last OV 09/14/2019).     Copy of ICM check sent to Dr. Johney Frame.   3 month ICM trend: 10/30/2021.    12-14 Month ICM trend:     [name], RN 10/30/2021 9:46 AM

## 2021-11-25 ENCOUNTER — Encounter: Payer: Self-pay | Admitting: Internal Medicine

## 2021-11-25 ENCOUNTER — Ambulatory Visit (INDEPENDENT_AMBULATORY_CARE_PROVIDER_SITE_OTHER): Payer: Medicare HMO | Admitting: Internal Medicine

## 2021-11-25 VITALS — BP 128/70 | HR 76 | Ht 64.0 in | Wt 224.0 lb

## 2021-11-25 DIAGNOSIS — Z95810 Presence of automatic (implantable) cardiac defibrillator: Secondary | ICD-10-CM

## 2021-11-25 DIAGNOSIS — I5042 Chronic combined systolic (congestive) and diastolic (congestive) heart failure: Secondary | ICD-10-CM

## 2021-11-25 DIAGNOSIS — I428 Other cardiomyopathies: Secondary | ICD-10-CM

## 2021-11-25 NOTE — Progress Notes (Signed)
PCP: Rebecka Apley, NP Primary Cardiologist: Dr Jens Som Primary EP: Dr Johney Frame  Anna Beltran is a 71 y.o. female who presents today for routine electrophysiology followup.  Since last being seen in our clinic, the patient reports doing reasonably well. She has chronic stable O2 dependant SOB.  She has occasional edema.  Today, she denies symptoms of palpitations, chest pain,  dizziness, presyncope, syncope, or ICD shocks.  The patient is otherwise without complaint today.   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   Chronic combined systolic and diastolic CHF (congestive heart failure) (HCC)    MPI 09/05/12 NORMAL, showed no scar & LVEF is 20-25%. ECHO 12/11/12 LV is markedly dilated, Overall LV systolic function severely impaired with EF = 25-30%, pseudonormal LV filling pattern (consistant with elevated LA pressure).   Chronic respiratory failure (HCC)    on supplemental O2 at night and as needed   Diabetes mellitus, type 2 (HCC)    TYPE 2   Essential hypertension, benign    Hyperlipidemia, mixed    Hypersomnia, unspecified    Non-ischemic cardiomyopathy (HCC)    Obesity    PUD (peptic ulcer disease)    Unspecified menopausal and postmenopausal disorder    Past Surgical History:  Procedure Laterality Date   CARDIAC DEFIBRILLATOR PLACEMENT  01/17/2014   DR Johney Frame   EXCISION OF SKIN TAG N/A 12/01/2018   Procedure: EXCISION OF SKIN TAG ANAL;  Surgeon: Romie Levee, MD;  Location: [facility];  Service: General;  Laterality: N/A;   IMPLANTABLE CARDIOVERTER DEFIBRILLATOR IMPLANT N/A 01/17/2014   Procedure: IMPLANTABLE CARDIOVERTER DEFIBRILLATOR IMPLANT;  Surgeon: Gardiner Rhyme, MD;  Location: [facility]  CATH LAB;  Service: Cardiovascular;  Laterality: N/A;   LEFT AND RIGHT HEART CATHETERIZATION WITH CORONARY ANGIOGRAM N/A 02/27/2013   Procedure: LEFT AND RIGHT HEART CATHETERIZATION WITH CORONARY ANGIOGRAM;  Surgeon: Rollene Rotunda, MD;  Location: [facility] CATH LAB;  Service: Cardiovascular;  Laterality: N/A;   none      ROS- all systems are reviewed and negative except as per HPI above  Current Outpatient Medications  Medication Sig Dispense Refill   albuterol (VENTOLIN HFA) 108 (90 Base) MCG/ACT inhaler Inhale 2 puffs into the lungs every 6 (six) hours as needed for wheezing or shortness of breath. 18 g 6   amLODipine (NORVASC) 5 MG tablet Take 1 tablet by mouth once daily 90 tablet 3   aspirin 81 MG tablet Take 81 mg by mouth daily.     carvedilol (COREG) 25 MG tablet TAKE 1 TABLET BY MOUTH TWICE DAILY WITH MEALS 180 tablet 1   furosemide (LASIX) 20 MG tablet Take 2 tablets (40 mg total) by mouth daily. 180 tablet 3   glipiZIDE (GLUCOTROL) 5 MG tablet Take 5 mg by mouth 2 (two) times daily before a meal.      hydrALAZINE (APRESOLINE) 100 MG tablet TAKE 1 TABLET BY MOUTH THREE TIMES DAILY 270 tablet 0   metFORMIN (GLUCOPHAGE) 1000 MG tablet Take 1,000 mg by mouth 2 (two) times daily with a meal.      omeprazole (PRILOSEC) 20 MG capsule Take 1 capsule by mouth twice daily 180 capsule 3   Polyvinyl Alcohol-Povidone (REFRESH OP) Place 1 drop  into both eyes 2 (two) times daily as needed (for dry eyes).      sacubitril-valsartan (ENTRESTO) 97-103 MG Take 1 tablet by mouth 2 (two) times daily. 180 tablet 3   fluticasone (FLOVENT HFA) 110 MCG/ACT inhaler Inhale 2 puffs into the lungs 2 (two) times daily. 1 each 12   spironolactone (ALDACTONE) 25 MG tablet Take 1 tablet (25 mg total) by mouth daily. 90 tablet 3   No current facility-administered medications for this visit.    Physical Exam: Vitals:   11/25/21 1546  BP: 128/70  Pulse: 76  Weight: 224 lb (101.6 kg)  Height: 5\' 4"  (1.626 m)     GEN- The patient is overweight and chronically ill appearing, alert and oriented x 3 today.   Head- normocephalic, atraumatic Eyes-  Sclera clear, conjunctiva pink Ears- hearing intact Oropharynx- clear Lungs- Clear to ausculation bilaterally, normal work of breathing, wearing O2 today Chest- ICD pocket is well healed Heart- Regular rate and rhythm, no murmurs, rubs or gallops, PMI not laterally displaced GI- soft, NT, ND, + BS Extremities- no clubbing, cyanosis, or edema  ICD interrogation- reviewed in detail today,  See PACEART report  ekg tracing ordered today is personally reviewed and shows sinus, incomplete rbbb  Wt Readings from Last 3 Encounters:  11/25/21 224 lb (101.6 kg)  10/21/21 224 lb 12.8 oz (102 kg)  09/16/21 221 lb (100.2 kg)    Assessment and Plan:  1.  Chronic systolic dysfunction/ nonischemic CM euvolemic today Stable on an appropriate medical regimen Normal ICD function See Pace Art report No changes today she is not device dependant today followed in ICM device clinic  2. HTN Stable No change required today  3. Obesity Body mass index is 38.45 kg/m. Lifestyle modification  Risks, benefits and potential toxicities for medications prescribed and/or refilled reviewed with patient today.  Follow-up with Dr 11/16/21 as scheduled Return to see EP APP in a year  Jens Som MD, [facility] 11/25/2021 4:06 PM

## 2021-11-25 NOTE — Patient Instructions (Addendum)
Medication Instructions:  Your physician recommends that you continue on your current medications as directed. Please refer to the Current Medication list given to you today. *If you need a refill on your cardiac medications before your next appointment, please call your pharmacy*  Lab Work: None ordered. If you have labs (blood work) drawn today and your tests are completely normal, you will receive your results only by: MyChart Message (if you have MyChart) OR A paper copy in the mail If you have any lab test that is abnormal or we need to change your treatment, we will call you to review the results.  Testing/Procedures: None ordered.  Your next home pacermaker check is 11/30/2021  Follow-Up: At [facility], you and your health needs are our priority.  As part of our continuing mission to provide you with exceptional heart care, we have created designated Provider Care Teams.  These Care Teams include your primary Cardiologist (physician) and Advanced Practice Providers (APPs -  Physician Assistants and Nurse Practitioners) who all work together to provide you with the care you need, when you need it.  Your next appointment:   Your physician wants you to follow-up in: one year  Casimiro Needle "Mardelle Matte" Tillery, PA-C You will receive a reminder letter in the mail two months in advance. If you don't receive a letter, please call our office to schedule the follow-up appointment.   Important Information About Sugar

## 2021-11-30 ENCOUNTER — Ambulatory Visit (INDEPENDENT_AMBULATORY_CARE_PROVIDER_SITE_OTHER): Payer: Medicare HMO

## 2021-11-30 DIAGNOSIS — I5042 Chronic combined systolic (congestive) and diastolic (congestive) heart failure: Secondary | ICD-10-CM | POA: Diagnosis not present

## 2021-11-30 DIAGNOSIS — Z95810 Presence of automatic (implantable) cardiac defibrillator: Secondary | ICD-10-CM | POA: Diagnosis not present

## 2021-12-04 ENCOUNTER — Telehealth: Payer: Self-pay

## 2021-12-04 NOTE — Telephone Encounter (Signed)
Remote ICM transmission received.  Attempted call to patient regarding ICM remote transmission and left detailed message per DPR.  Advised to return call for any fluid symptoms or questions. Next ICM remote transmission scheduled 01/04/2022.    

## 2021-12-04 NOTE — Progress Notes (Signed)
EPIC Encounter for ICM Monitoring  Patient Name: Anna Beltran is a 71 y.o. female Date: 12/04/2021 Primary Care Physican: Rebecka Apley, NP Primary Cardiologist: Jens Som Electrophysiologist: Allred 10/27/2021 Weight: 224 lbs 10/30/2021 Weight: 221 lbs (baseline 220-222 lbs)                                               Attempted call to patient and unable to reach.  Left detailed message per DPR regarding transmission. Transmission reviewed.    Optivol thoracic impedance suggesting a pattern of possible fluid accumulation for 4-5 days followed by 2-3 days at baseline.   Prescribed:  Furosemide 20 mg take 2 tablets (40 mg total) by mouth daily. Spironolactone 25 mg take 1 tablet daily   Labs: 07/24/2021 Creatinine 0.74, BUN 15, Potassium 4.7, Sodium 140 07/07/2021 Creatinine 0.87, BUN 18, Potassium 4.2, Sodium 144  A complete set of results can be found in Results Review.   Recommendations:  Left voice mail with ICM number and encouraged to call if experiencing any fluid symptoms.    Follow-up plan: ICM clinic phone appointment on 01/04/2022.   91 day device clinic remote transmission 01/12/2022.     EP/Cardiology Office Visits:   Recall 09/08/2020 with Dr Johney Frame (last OV 09/14/2019).     Copy of ICM check sent to Dr. Johney Frame.   3 month ICM trend: 11/30/2021.    12-14 Month ICM trend:     [name], RN 12/04/2021 3:02 PM

## 2021-12-20 ENCOUNTER — Other Ambulatory Visit: Payer: Self-pay | Admitting: Cardiology

## 2021-12-20 DIAGNOSIS — E7800 Pure hypercholesterolemia, unspecified: Secondary | ICD-10-CM

## 2021-12-21 ENCOUNTER — Encounter: Payer: Self-pay | Admitting: Pulmonary Disease

## 2021-12-21 ENCOUNTER — Ambulatory Visit: Payer: Medicare HMO | Admitting: Pulmonary Disease

## 2021-12-21 VITALS — BP 148/92 | HR 82 | Temp 97.9°F | Ht 64.0 in | Wt 222.0 lb

## 2021-12-21 DIAGNOSIS — J4520 Mild intermittent asthma, uncomplicated: Secondary | ICD-10-CM

## 2021-12-21 DIAGNOSIS — R942 Abnormal results of pulmonary function studies: Secondary | ICD-10-CM | POA: Diagnosis not present

## 2021-12-21 DIAGNOSIS — R0989 Other specified symptoms and signs involving the circulatory and respiratory systems: Secondary | ICD-10-CM | POA: Diagnosis not present

## 2021-12-21 DIAGNOSIS — R0609 Other forms of dyspnea: Secondary | ICD-10-CM | POA: Diagnosis not present

## 2021-12-21 DIAGNOSIS — J849 Interstitial pulmonary disease, unspecified: Secondary | ICD-10-CM

## 2021-12-21 DIAGNOSIS — J9611 Chronic respiratory failure with hypoxia: Secondary | ICD-10-CM | POA: Diagnosis not present

## 2021-12-21 NOTE — Progress Notes (Signed)
[facility]  Chief Complaint  Patient presents with   Follow-up    Breathing has worsened since last ov. Using 2.5-3LO2 24/7     Constitutional:  BP (!) 148/92 (BP Location: Right Arm, Patient Position: Sitting)   Pulse 82   Temp 97.9 F (36.6 C) (Temporal)   Ht 5\' 4"  (1.626 m)   Wt 222 lb (100.7 kg)   SpO2 (!) 87% Comment: 2.5LO2 cont.  BMI 38.11 kg/m   Past Medical History:  Systolic CHF with non ischemic CM, s/p AICD, DM, HTN, HLD, PUD  Past Surgical History:  She  has a past surgical history that includes none; Cardiac defibrillator placement (01/17/2014); left and right heart catheterization with coronary angiogram (N/A, 02/27/2013); implantable cardioverter defibrillator implant (N/A, 01/17/2014); and Excision of skin tag (N/A, 12/01/2018).  Brief Summary:  Anna Beltran is a 71 y.o. female with dyspnea from asthma, systolic CHF, and chronic hypoxic respiratory failure.      Subjective:   She continues to struggle with her breathing.  She has intermittent dry cough.  Not wheeze or chest congestion.  Hasn't needed inhalers much.  She gets windy and dizzy when walking at home.  Her SpO2 was in the 80's on 2.5 liters while walking today.  She has more leg swelling.  Her chest xray from October 23, 2021 showed cardiomegaly and increased interstitial markings.  Physical Exam:   Appearance - well kempt, wearing oxygen  ENMT - no sinus tenderness, no oral exudate, no LAN, Mallampati 3 airway, no stridor  Respiratory - bilateral crackles  CV - s1s2 regular rate and rhythm, no murmurs  Ext - no clubbing, no edema  Skin - no rashes  Psych - normal mood and affect     Pulmonary testing:  PFT 07/26/18 >> FEV1 1.14 (61%), FEV1% 79, TLC 4.19 (82%), RV:TLC 141, DLCO 67%, +BD from FEF 25-75  Sleep Tests:  ONO with RA 11/26/19 >> test time 5 hrs 56 min.  Baseline SpO2 81%, SpO2 low 70%.  Spent 5 hr 56 min with SpO2 < 88%.  Cardiac  Tests:  Echo 03/02/21 >> EF 40%, mod LVH, grade 1 DD, RVSP 54.3 mmHg, mod TR and PR  Social History:  She  reports that she has never smoked. She has never used smokeless tobacco. She reports that she does not drink alcohol and does not use drugs.  Family History:  Her family history includes Cancer in her brother; Diabetes Mellitus II in her mother; Hypertension in her father.     Assessment/Plan:   Dyspnea on exertion. - this is progressive - she has persistent crackles on lung exam, and previous PFT showed diffusion defect - will arrange for high resolution CT chest to assess whether she could have underlying interstitial lung disease contributing to her respiratory issues   Mild, intermittent asthma. - prn flovent, albuterol - don't think this is much of an issue at present   Chronic respiratory failure with hypoxia. - goal SpO2 > 90% - will see if she can get a portable oxygen concentrator - advised her to use 3 to 3.5 liters oxygen   Chronic systolic CHF from non ischemic cardiomyopathy, s/p AICD. - followed by Dr. 03/04/21 and Dr. Jens Som with cardiology  Time Spent Involved in Patient Care on Day of Examination:  36 minutes  Follow up:   Patient Instructions  Your goal oxygen is above 90%.  Will arrange for [facility] CT [facility].  Follow up in 4 months.  Medication List:   Allergies as of 12/21/2021       Reactions   Isosorbide Nitrate Other (See Comments)   Very bad headaches        Medication List        Accurate as of December 21, 2021  9:17 AM. If you have any questions, ask your nurse or doctor.          albuterol 108 (90 Base) MCG/ACT inhaler Commonly known as: VENTOLIN HFA Inhale 2 puffs into the lungs every 6 (six) hours as needed for wheezing or shortness of breath.   amLODipine 5 MG tablet Commonly known as: NORVASC Take 1 tablet by mouth once daily   aspirin 81 MG tablet Take 81 mg by mouth daily.    carvedilol 25 MG tablet Commonly known as: COREG TAKE 1 TABLET BY MOUTH TWICE DAILY WITH MEALS   Entresto 97-103 MG Generic drug: sacubitril-valsartan Take 1 tablet by mouth 2 (two) times daily.   fluticasone 110 MCG/ACT inhaler Commonly known as: Flovent HFA Inhale 2 puffs into the lungs 2 (two) times daily.   furosemide 20 MG tablet Commonly known as: LASIX Take 2 tablets (40 mg total) by mouth daily.   glipiZIDE 5 MG tablet Commonly known as: GLUCOTROL Take 5 mg by mouth 2 (two) times daily before a meal.   hydrALAZINE 100 MG tablet Commonly known as: APRESOLINE TAKE 1 TABLET BY MOUTH THREE TIMES DAILY   metFORMIN 1000 MG tablet Commonly known as: GLUCOPHAGE Take 1,000 mg by mouth 2 (two) times daily with a meal.   omeprazole 20 MG capsule Commonly known as: PRILOSEC Take 1 capsule by mouth twice daily   REFRESH OP Place 1 drop into both eyes 2 (two) times daily as needed (for dry eyes).   spironolactone 25 MG tablet Commonly known as: ALDACTONE Take 1 tablet (25 mg total) by mouth daily.        Signature:  Coralyn Helling, MD [facility] Pulmonary/Critical Care Pager - [phone] 12/21/2021, 9:17 AM

## 2021-12-21 NOTE — Patient Instructions (Signed)
Your goal oxygen is above 90%.  Will arrange for high resolution CT chest at [facility].  Follow up in 4 months.

## 2021-12-31 ENCOUNTER — Inpatient Hospital Stay (HOSPITAL_COMMUNITY)
Admission: EM | Admit: 2021-12-31 | Discharge: 2022-01-04 | DRG: 291 | Disposition: A | Discharge status: Home / Self Care | Payer: Medicare HMO | Attending: Family Medicine | Admitting: Family Medicine

## 2021-12-31 ENCOUNTER — Emergency Department (HOSPITAL_COMMUNITY): Payer: Medicare HMO

## 2021-12-31 ENCOUNTER — Encounter (HOSPITAL_COMMUNITY): Payer: Self-pay | Admitting: Emergency Medicine

## 2021-12-31 ENCOUNTER — Other Ambulatory Visit: Payer: Self-pay

## 2021-12-31 DIAGNOSIS — I110 Hypertensive heart disease with heart failure: Principal | ICD-10-CM | POA: Diagnosis present

## 2021-12-31 DIAGNOSIS — I5031 Acute diastolic (congestive) heart failure: Secondary | ICD-10-CM | POA: Diagnosis not present

## 2021-12-31 DIAGNOSIS — I428 Other cardiomyopathies: Secondary | ICD-10-CM | POA: Diagnosis present

## 2021-12-31 DIAGNOSIS — I5043 Acute on chronic combined systolic (congestive) and diastolic (congestive) heart failure: Secondary | ICD-10-CM | POA: Diagnosis not present

## 2021-12-31 DIAGNOSIS — J9621 Acute and chronic respiratory failure with hypoxia: Secondary | ICD-10-CM | POA: Diagnosis present

## 2021-12-31 DIAGNOSIS — Z833 Family history of diabetes mellitus: Secondary | ICD-10-CM

## 2021-12-31 DIAGNOSIS — Z8249 Family history of ischemic heart disease and other diseases of the circulatory system: Secondary | ICD-10-CM

## 2021-12-31 DIAGNOSIS — Z7984 Long term (current) use of oral hypoglycemic drugs: Secondary | ICD-10-CM

## 2021-12-31 DIAGNOSIS — Z7982 Long term (current) use of aspirin: Secondary | ICD-10-CM

## 2021-12-31 DIAGNOSIS — Z7951 Long term (current) use of inhaled steroids: Secondary | ICD-10-CM

## 2021-12-31 DIAGNOSIS — I2510 Atherosclerotic heart disease of native coronary artery without angina pectoris: Secondary | ICD-10-CM | POA: Diagnosis present

## 2021-12-31 DIAGNOSIS — E119 Type 2 diabetes mellitus without complications: Secondary | ICD-10-CM

## 2021-12-31 DIAGNOSIS — Z888 Allergy status to other drugs, medicaments and biological substances status: Secondary | ICD-10-CM

## 2021-12-31 DIAGNOSIS — J45909 Unspecified asthma, uncomplicated: Secondary | ICD-10-CM

## 2021-12-31 DIAGNOSIS — Z9981 Dependence on supplemental oxygen: Secondary | ICD-10-CM

## 2021-12-31 DIAGNOSIS — J9610 Chronic respiratory failure, unspecified whether with hypoxia or hypercapnia: Secondary | ICD-10-CM | POA: Diagnosis present

## 2021-12-31 DIAGNOSIS — Z95810 Presence of automatic (implantable) cardiac defibrillator: Secondary | ICD-10-CM

## 2021-12-31 DIAGNOSIS — Z6836 Body mass index (BMI) 36.0-36.9, adult: Secondary | ICD-10-CM

## 2021-12-31 DIAGNOSIS — E782 Mixed hyperlipidemia: Secondary | ICD-10-CM | POA: Diagnosis present

## 2021-12-31 DIAGNOSIS — I5042 Chronic combined systolic (congestive) and diastolic (congestive) heart failure: Secondary | ICD-10-CM

## 2021-12-31 DIAGNOSIS — Z79899 Other long term (current) drug therapy: Secondary | ICD-10-CM

## 2021-12-31 LAB — CBC WITH DIFFERENTIAL/PLATELET
Abs Immature Granulocytes: 0.02 10*3/uL (ref 0.00–0.07)
Basophils Absolute: 0 10*3/uL (ref 0.0–0.1)
Basophils Relative: 0 %
Eosinophils Absolute: 0.1 10*3/uL (ref 0.0–0.5)
Eosinophils Relative: 2 %
HCT: 50.1 % — ABNORMAL HIGH (ref 36.0–46.0)
Hemoglobin: 15.9 g/dL — ABNORMAL HIGH (ref 12.0–15.0)
Immature Granulocytes: 0 %
Lymphocytes Relative: 18 %
Lymphs Abs: 0.8 10*3/uL (ref 0.7–4.0)
MCH: 32.1 pg (ref 26.0–34.0)
MCHC: 31.7 g/dL (ref 30.0–36.0)
MCV: 101.2 fL — ABNORMAL HIGH (ref 80.0–100.0)
Monocytes Absolute: 0.2 10*3/uL (ref 0.1–1.0)
Monocytes Relative: 5 %
Neutro Abs: 3.3 10*3/uL (ref 1.7–7.7)
Neutrophils Relative %: 75 %
Platelets: 121 10*3/uL — ABNORMAL LOW (ref 150–400)
RBC: 4.95 MIL/uL (ref 3.87–5.11)
RDW: 15.3 % (ref 11.5–15.5)
WBC: 4.5 10*3/uL (ref 4.0–10.5)
nRBC: 0 % (ref 0.0–0.2)

## 2021-12-31 LAB — COMPREHENSIVE METABOLIC PANEL
ALT: 16 U/L (ref 0–44)
AST: 16 U/L (ref 15–41)
Albumin: 2.9 g/dL — ABNORMAL LOW (ref 3.5–5.0)
Alkaline Phosphatase: 49 U/L (ref 38–126)
Anion gap: 6 (ref 5–15)
BUN: 14 mg/dL (ref 8–23)
CO2: 27 mmol/L (ref 22–32)
Calcium: 8 mg/dL — ABNORMAL LOW (ref 8.9–10.3)
Chloride: 110 mmol/L (ref 98–111)
Creatinine, Ser: 0.74 mg/dL (ref 0.44–1.00)
GFR, Estimated: >60 mL/min (ref >60)
Glucose, Bld: 85 mg/dL (ref 70–99)
Potassium: 3.9 mmol/L (ref 3.5–5.1)
Sodium: 143 mmol/L (ref 135–145)
Total Bilirubin: 1 mg/dL (ref 0.3–1.2)
Total Protein: 6 g/dL — ABNORMAL LOW (ref 6.5–8.1)

## 2021-12-31 LAB — GLUCOSE, CAPILLARY
Glucose-Capillary: 84 mg/dL (ref 70–99)
Glucose-Capillary: 109 mg/dL — ABNORMAL HIGH (ref 70–99)

## 2021-12-31 LAB — HEMOGLOBIN A1C
Hgb A1c MFr Bld: 5.9 % — ABNORMAL HIGH (ref 4.8–5.6)
Mean Plasma Glucose: 122.63 mg/dL

## 2021-12-31 LAB — BRAIN NATRIURETIC PEPTIDE: B Natriuretic Peptide: 312.6 pg/mL — ABNORMAL HIGH (ref 0.0–100.0)

## 2021-12-31 LAB — TROPONIN I (HIGH SENSITIVITY)
Troponin I (High Sensitivity): 4 ng/L (ref <18)
Troponin I (High Sensitivity): 5 ng/L (ref <18)

## 2021-12-31 MED ORDER — ALBUTEROL SULFATE (2.5 MG/3ML) 0.083% IN NEBU: 2.5000 mg | INHALATION_SOLUTION | RESPIRATORY_TRACT | Status: DC | PRN

## 2021-12-31 MED ORDER — CARVEDILOL 25 MG PO TABS
25.0000 mg | ORAL_TABLET | Freq: Two times a day (BID) | ORAL | Status: DC
  Administered 2021-12-31 – 2022-01-04 (×6): 25 mg via ORAL
  Filled 2021-12-31 (×7): qty 1

## 2021-12-31 MED ORDER — FUROSEMIDE 10 MG/ML IJ SOLN
40.0000 mg | Freq: Once | INTRAMUSCULAR | Status: AC
  Administered 2021-12-31: 40 mg via INTRAVENOUS
  Filled 2021-12-31: qty 4

## 2021-12-31 MED ORDER — ACETAMINOPHEN 325 MG PO TABS: 650.0000 mg | ORAL_TABLET | Freq: Four times a day (QID) | ORAL | Status: DC | PRN

## 2021-12-31 MED ORDER — PANTOPRAZOLE SODIUM 40 MG PO TBEC: 40.0000 mg | DELAYED_RELEASE_TABLET | Freq: Every evening | ORAL | Status: DC | PRN

## 2021-12-31 MED ORDER — INSULIN ASPART 100 UNIT/ML IJ SOLN
0.0000 [IU] | Freq: Three times a day (TID) | INTRAMUSCULAR | Status: DC
  Filled 2021-12-31: qty 0.06

## 2021-12-31 MED ORDER — HYDRALAZINE HCL 50 MG PO TABS
100.0000 mg | ORAL_TABLET | Freq: Three times a day (TID) | ORAL | Status: DC
  Administered 2022-01-01 – 2022-01-03 (×7): 100 mg via ORAL
  Filled 2021-12-31 (×8): qty 2

## 2021-12-31 MED ORDER — SODIUM CHLORIDE 0.9% FLUSH
3.0000 mL | Freq: Two times a day (BID) | INTRAVENOUS | Status: DC
  Administered 2021-12-31 – 2022-01-04 (×8): 3 mL via INTRAVENOUS

## 2021-12-31 MED ORDER — ENOXAPARIN SODIUM 40 MG/0.4ML IJ SOSY
40.0000 mg | PREFILLED_SYRINGE | INTRAMUSCULAR | Status: DC
  Administered 2021-12-31 – 2022-01-03 (×4): 40 mg via SUBCUTANEOUS
  Filled 2021-12-31 (×4): qty 0.4

## 2021-12-31 MED ORDER — POLYVINYL ALCOHOL 1.4 % OP SOLN
1.0000 [drp] | OPHTHALMIC | Status: DC | PRN
Start: 2021-12-31 — End: 2022-01-04

## 2021-12-31 MED ORDER — AMLODIPINE BESYLATE 5 MG PO TABS
5.0000 mg | ORAL_TABLET | Freq: Every day | ORAL | Status: DC
  Administered 2022-01-01 – 2022-01-02 (×2): 5 mg via ORAL
  Filled 2021-12-31 (×3): qty 1

## 2021-12-31 MED ORDER — ASPIRIN 81 MG PO TBEC
81.0000 mg | DELAYED_RELEASE_TABLET | Freq: Every day | ORAL | Status: DC
  Administered 2021-12-31 – 2022-01-03 (×4): 81 mg via ORAL
  Filled 2021-12-31 (×4): qty 1

## 2021-12-31 MED ORDER — POLYETHYLENE GLYCOL 3350 17 G PO PACK
17.0000 g | PACK | Freq: Every day | ORAL | Status: DC | PRN
  Administered 2022-01-03: 17 g via ORAL
  Filled 2021-12-31: qty 1

## 2021-12-31 MED ORDER — SACUBITRIL-VALSARTAN 97-103 MG PO TABS
1.0000 | ORAL_TABLET | Freq: Two times a day (BID) | ORAL | Status: DC
  Administered 2022-01-01 – 2022-01-04 (×6): 1 via ORAL
  Filled 2021-12-31 (×7): qty 1

## 2021-12-31 MED ORDER — FUROSEMIDE 10 MG/ML IJ SOLN
40.0000 mg | Freq: Two times a day (BID) | INTRAMUSCULAR | Status: DC
  Administered 2021-12-31 – 2022-01-03 (×6): 40 mg via INTRAVENOUS
  Filled 2021-12-31 (×6): qty 4

## 2021-12-31 MED ORDER — ONDANSETRON HCL 4 MG/2ML IJ SOLN: 4.0000 mg | Freq: Four times a day (QID) | INTRAMUSCULAR | Status: DC | PRN

## 2021-12-31 MED ORDER — SODIUM CHLORIDE 0.9 % IV SOLN: 250.0000 mL | INTRAVENOUS | Status: DC | PRN

## 2021-12-31 MED ORDER — ALBUTEROL SULFATE HFA 108 (90 BASE) MCG/ACT IN AERS: 2.0000 | INHALATION_SPRAY | RESPIRATORY_TRACT | Status: DC | PRN

## 2021-12-31 MED ORDER — ONDANSETRON HCL 4 MG PO TABS: 4.0000 mg | ORAL_TABLET | Freq: Four times a day (QID) | ORAL | Status: DC | PRN

## 2021-12-31 MED ORDER — ACETAMINOPHEN 650 MG RE SUPP: 650.0000 mg | Freq: Four times a day (QID) | RECTAL | Status: DC | PRN

## 2021-12-31 MED ORDER — SODIUM CHLORIDE 0.9% FLUSH: 3.0000 mL | INTRAVENOUS | Status: DC | PRN

## 2021-12-31 MED ORDER — SPIRONOLACTONE 25 MG PO TABS: 25.0000 mg | ORAL_TABLET | Freq: Every day | ORAL | Status: DC

## 2021-12-31 NOTE — Progress Notes (Signed)
A consult was placed to the IV Therapist for iv access;  pt received one dose of IV Lasix, but now has no IV;  both arms assessed thoroughly with ultrasound; no suitable veins noted; pt stated "I haven't had anything to eat or drink all day, and they gave me Lasix."

## 2021-12-31 NOTE — ED Notes (Signed)
Dr. Estell Harpin stated he did not want a repeat lab @ this time

## 2021-12-31 NOTE — ED Triage Notes (Signed)
BIBA Pt coming from PCP w/ c/o SHOB, Extremity swelling, cough, SHOB x3 days Sats of 88 on 3L - wears 3L as needed at home Hx CHF NRB at 15L satting at 98% en route.  124/68BP 74 HR 114 CBG  18G LAC

## 2021-12-31 NOTE — H&P (Addendum)
History and Physical    Patient: Anna Beltran NWG:[id number] DOB: 08/06/1950 DOA: 12/31/2021 DOS: the patient was seen and examined on 12/31/2021 PCP: Rebecka Apley, NP  Patient coming from: Home  Chief Complaint:  Chief Complaint  Patient presents with   Shortness of Breath   HPI: 71 year old woman PMH combined systolic and diastolic CHF, chronic hypoxic respiratory failure, asthma, diabetes presented with increasing shortness of breath and lower extremity edema.  Admitted for acute on chronic combined systolic and diastolic CHF, acute on chronic hypoxic respiratory failure.  Patient lives at home with her husband, over the last week or 2 she is noted increasing lower extremity edema, dyspnea on exertion, orthopnea.  Not sure about weight gain.  She has been compliant with medications but does use salt at times with her meals.  Associated with increasing shortness of breath, "almost all I was not going to make it".  In the emergency department required nonrebreather and then high flow nasal cannula, treated with Lasix with rapid diuresis and clinical improvement.  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   Chronic combined systolic and diastolic CHF (congestive heart failure) (HCC)    MPI 09/05/12 NORMAL, showed no scar & LVEF is 20-25%. ECHO 12/11/12 LV is markedly dilated, Overall LV systolic function severely impaired with EF = 25-30%, pseudonormal LV filling pattern (consistant with elevated LA pressure).   Chronic respiratory failure  (HCC)    on supplemental O2 at night and as needed   Diabetes mellitus, type 2 (HCC)    TYPE 2   Essential hypertension, benign    Hyperlipidemia, mixed    Hypersomnia, unspecified    Non-ischemic cardiomyopathy (HCC)    Obesity    PUD (peptic ulcer disease)    Unspecified menopausal and postmenopausal disorder    Past Surgical History:  Procedure Laterality Date   CARDIAC DEFIBRILLATOR PLACEMENT  01/17/2014   DR Johney Frame   EXCISION OF SKIN TAG N/A 12/01/2018   Procedure: EXCISION OF SKIN TAG ANAL;  Surgeon: Romie Levee, MD;  Location: [facility];  Service: General;  Laterality: N/A;   IMPLANTABLE CARDIOVERTER DEFIBRILLATOR IMPLANT N/A 01/17/2014   Procedure: IMPLANTABLE CARDIOVERTER DEFIBRILLATOR IMPLANT;  Surgeon: Gardiner Rhyme, MD;  Location: [facility] CATH LAB;  Service: Cardiovascular;  Laterality: N/A;   LEFT AND RIGHT HEART CATHETERIZATION WITH CORONARY ANGIOGRAM N/A 02/27/2013   Procedure: LEFT AND RIGHT HEART CATHETERIZATION WITH CORONARY ANGIOGRAM;  Surgeon: Rollene Rotunda, MD;  Location: [facility] CATH LAB;  Service: Cardiovascular;  Laterality: N/A;   none     Social History:  reports that she has never smoked. She has never used smokeless tobacco. She reports that she does not drink alcohol and does not use drugs.  Allergies  Allergen Reactions   Isosorbide Nitrate Other (See Comments)    Very bad headaches    Family History  Problem Relation Age of Onset   Diabetes Mellitus II Mother    Cancer Brother        Colon   Hypertension Father     Prior to Admission medications  Medication Sig Start Date End Date Taking? Authorizing Provider  albuterol (VENTOLIN HFA) 108 (90 Base) MCG/ACT inhaler Inhale 2 puffs into the lungs every 6 (six) hours as needed for wheezing or shortness of breath. 04/02/21   Coralyn Helling, MD  amLODipine (NORVASC) 5 MG tablet Take 1 tablet by mouth once daily 07/29/21   Lewayne Bunting, MD  aspirin 81 MG tablet Take 81 mg by mouth daily.    [provider]  carvedilol (COREG) 25 MG tablet TAKE 1 TABLET BY MOUTH TWICE DAILY WITH MEALS 06/17/21   Lewayne Bunting, MD  fluticasone (FLOVENT HFA) 110 MCG/ACT inhaler Inhale 2 puffs into the lungs 2 (two) times daily. 04/02/21 05/02/21  Coralyn Helling, MD  furosemide (LASIX) 20 MG tablet Take 2 tablets (40 mg total) by mouth daily. 07/10/21   Lewayne Bunting, MD  glipiZIDE (GLUCOTROL) 5 MG tablet Take 5 mg by mouth 2 (two) times daily before a meal.  09/04/18   [provider]  hydrALAZINE (APRESOLINE) 100 MG tablet TAKE 1 TABLET BY MOUTH THREE TIMES DAILY 07/30/20   Lewayne Bunting, MD  metFORMIN (GLUCOPHAGE) 1000 MG tablet Take 1,000 mg by mouth 2 (two) times daily with a meal.  07/06/16   [provider]  omeprazole (PRILOSEC) 20 MG capsule Take 1 capsule by mouth twice daily 07/29/21   Lewayne Bunting, MD  Polyvinyl Alcohol-Povidone (REFRESH OP) Place 1 drop into both eyes 2 (two) times daily as needed (for dry eyes).     [provider]  sacubitril-valsartan (ENTRESTO) 97-103 MG Take 1 tablet by mouth 2 (two) times daily. 02/11/21   Lewayne Bunting, MD  spironolactone (ALDACTONE) 25 MG tablet Take 1 tablet (25 mg total) by mouth daily. 02/11/21 09/16/21  Lewayne Bunting, MD    Physical Exam: Vitals:   12/31/21 1400 12/31/21 1430 12/31/21 1443 12/31/21 1500  BP: (!) 130/119 136/77  (!) 163/114  Pulse: 71 62  74  Resp: 18 18  (!) 21  Temp:   97.6 F (36.4 C)   TempSrc:   Oral   SpO2: 91% 93%  97%   Physical Exam Vitals reviewed.  Constitutional:      General: She is not in acute distress.    Appearance: She is not ill-appearing or toxic-appearing.  Cardiovascular:     Rate and Rhythm: Normal rate and regular rhythm.     Heart sounds: No murmur heard. Pulmonary:     Effort: Pulmonary effort is normal. No respiratory distress.     Breath sounds: No wheezing, rhonchi or rales (There are a few posteriorly).  Abdominal:     General: There is no  distension.     Palpations: Abdomen is soft.     Tenderness: There is no abdominal tenderness.  Musculoskeletal:     Right lower leg: Edema present.     Left lower leg: Edema present.  Neurological:     Mental Status: She is alert.  Psychiatric:        Mood and Affect: Mood normal.        Behavior: Behavior normal.     Data Reviewed:  CMP unremarkable, BNP elevated 312, troponin within normal limits, CBC unremarkable Chest x-ray independently reviewed: Moderate pulmonary edema EKG independently reviewed sinus rhythm, no acute changes  Assessment and Plan: Acute on chronic combined systolic and diastolic CHF with pulmonary edema, acute on chronic hypoxic respiratory failure, orthopnea and increasing dyspnea on exertion. -- Echo October 2022 LVEF 40%, grade  1 diastolic dysfunction. -- Responding well to diuresis thus far, currently on daily nasal cannula.  Continue aggressive IV diuresis, follow BMP, wean oxygen as tolerated. --Chronic hypoxic respiratory failure on 2-1/2 L.  Uses at night and sometimes during the day.  Asthma -- Appears stable.  Continue bronchodilator.  Diabetes mellitus type 2 -- Random blood sugar 85.  Sliding scale insulin.  Hold oral agents for now.   Advance Care Planning: Full  Consults: none  Family Communication: husband at bedside  Severity of Illness: The appropriate patient status for this patient is OBSERVATION. Observation status is judged to be reasonable and necessary in order to provide the required intensity of service to ensure the patient's safety. The patient's presenting symptoms, physical exam findings, and initial radiographic and laboratory data in the context of their medical condition is felt to place them at decreased risk for further clinical deterioration. Furthermore, it is anticipated that the patient will be medically stable for discharge from the hospital within 2 midnights of admission.   Author: Brendia Sacks,  MD 12/31/2021 3:48 PM  For on call review [email or web address].

## 2021-12-31 NOTE — Progress Notes (Signed)
Called to PT room for desaturation. Pleased PT on 8 LPM high flow nasal cannula system (see Flowsheet). RN and MD aware.

## 2021-12-31 NOTE — ED Provider Notes (Signed)
[facility]-EMERGENCY DEPT Provider Note   CSN: [id number] Arrival date & time: 12/31/21  1033     History  Chief Complaint  Patient presents with   Shortness of Breath    Anna Beltran is a 71 y.o. female.  Patient has a history of diastolic heart failure and asthma.  She states she is normally on 2 and half to 3 L of O2 her sats have been in the 80s.  She is more short of breath  The history is provided by the patient and medical records. No language interpreter was used.  Shortness of Breath Severity:  Moderate Onset quality:  Sudden Timing:  Constant Progression:  Worsening Chronicity:  New Context: activity   Relieved by:  Nothing Worsened by:  Nothing Ineffective treatments:  None tried Associated symptoms: no abdominal pain, no chest pain, no cough, no headaches and no rash        Home Medications Prior to Admission medications   Medication Sig Start Date End Date Taking? Authorizing Provider  albuterol (VENTOLIN HFA) 108 (90 Base) MCG/ACT inhaler Inhale 2 puffs into the lungs every 6 (six) hours as needed for wheezing or shortness of breath. 04/02/21   Coralyn Helling, MD  amLODipine (NORVASC) 5 MG tablet Take 1 tablet by mouth once daily 07/29/21   Lewayne Bunting, MD  aspirin 81 MG tablet Take 81 mg by mouth daily.    [provider]  carvedilol (COREG) 25 MG tablet TAKE 1 TABLET BY MOUTH TWICE DAILY WITH MEALS 06/17/21   Lewayne Bunting, MD  fluticasone (FLOVENT HFA) 110 MCG/ACT inhaler Inhale 2 puffs into the lungs 2 (two) times daily. 04/02/21 05/02/21  Coralyn Helling, MD  furosemide (LASIX) 20 MG tablet Take 2 tablets (40 mg total) by mouth daily. 07/10/21   Lewayne Bunting, MD  glipiZIDE (GLUCOTROL) 5 MG tablet Take 5 mg by mouth 2 (two) times daily before a meal.  09/04/18   [provider]  hydrALAZINE (APRESOLINE) 100 MG tablet TAKE 1 TABLET BY MOUTH THREE TIMES DAILY 07/30/20   Lewayne Bunting, MD  metFORMIN  (GLUCOPHAGE) 1000 MG tablet Take 1,000 mg by mouth 2 (two) times daily with a meal.  07/06/16   [provider]  omeprazole (PRILOSEC) 20 MG capsule Take 1 capsule by mouth twice daily 07/29/21   Lewayne Bunting, MD  Polyvinyl Alcohol-Povidone (REFRESH OP) Place 1 drop into both eyes 2 (two) times daily as needed (for dry eyes).     [provider]  sacubitril-valsartan (ENTRESTO) 97-103 MG Take 1 tablet by mouth 2 (two) times daily. 02/11/21   Lewayne Bunting, MD  spironolactone (ALDACTONE) 25 MG tablet Take 1 tablet (25 mg total) by mouth daily. 02/11/21 09/16/21  Lewayne Bunting, MD      Allergies    Isosorbide nitrate    Review of Systems   Review of Systems  Constitutional:  Negative for appetite change and fatigue.  HENT:  Negative for congestion, ear discharge and sinus pressure.   Eyes:  Negative for discharge.  Respiratory:  Positive for shortness of breath. Negative for cough.   Cardiovascular:  Negative for chest pain.  Gastrointestinal:  Negative for abdominal pain and diarrhea.  Genitourinary:  Negative for frequency and hematuria.  Musculoskeletal:  Negative for back pain.  Skin:  Negative for rash.  Neurological:  Negative for seizures and headaches.  Psychiatric/Behavioral:  Negative for hallucinations.     Physical Exam Updated Vital Signs BP 136/77  Pulse 62   Temp 97.6 F (36.4 C) (Oral)   Resp 18   SpO2 93%  Physical Exam Vitals and nursing note reviewed.  Constitutional:      Appearance: She is well-developed.  HENT:     Head: Normocephalic.     Nose: Nose normal.  Eyes:     General: No scleral icterus.    Conjunctiva/sclera: Conjunctivae normal.  Neck:     Thyroid: No thyromegaly.  Cardiovascular:     Rate and Rhythm: Normal rate and regular rhythm.     Heart sounds: No murmur heard.    No friction rub. No gallop.  Pulmonary:     Breath sounds: No stridor. No wheezing or rales.     Comments: Short of breath Chest:     Chest  wall: No tenderness.  Abdominal:     General: There is no distension.     Tenderness: There is no abdominal tenderness. There is no rebound.  Musculoskeletal:        General: Normal range of motion.     Cervical back: Neck supple.  Lymphadenopathy:     Cervical: No cervical adenopathy.  Skin:    Findings: No erythema or rash.  Neurological:     Mental Status: She is alert and oriented to person, place, and time.     Motor: No abnormal muscle tone.     Coordination: Coordination normal.  Psychiatric:        Behavior: Behavior normal.     ED Results / Procedures / Treatments   Labs (all labs ordered are listed, but only abnormal results are displayed) Labs Reviewed  CBC WITH DIFFERENTIAL/PLATELET - Abnormal; Notable for the following components:      Result Value   Hemoglobin 15.9 (*)    HCT 50.1 (*)    MCV 101.2 (*)    Platelets 121 (*)    All other components within normal limits  COMPREHENSIVE METABOLIC PANEL - Abnormal; Notable for the following components:   Calcium 8.0 (*)    Total Protein 6.0 (*)    Albumin 2.9 (*)    All other components within normal limits  BRAIN NATRIURETIC PEPTIDE - Abnormal; Notable for the following components:   B Natriuretic Peptide 312.6 (*)    All other components within normal limits  TROPONIN I (HIGH SENSITIVITY)  TROPONIN I (HIGH SENSITIVITY)    EKG EKG Interpretation  Date/Time:  Thursday December 31 2021 10:41:51 EDT Ventricular Rate:  70 PR Interval:  168 QRS Duration: 91 QT Interval:  390 QTC Calculation: 421 R Axis:   228 Text Interpretation: Sinus rhythm Left atrial enlargement Probable right ventricular hypertrophy Nonspecific T abnormalities, lateral leads Confirmed by Bethann Berkshire [phone]) on 12/31/2021 11:06:43 AM  Radiology DG Chest 2 View  Result Date: 12/31/2021 CLINICAL DATA:  Shortness of breath EXAM: CHEST - 2 VIEW COMPARISON:  October 22, 2021, July 23, 2012 FINDINGS: Intact left chest wall AICD. The  cardiomediastinal silhouette is unchanged in contour. Bilateral moderate interstitial pulmonary opacities. Small bilateral pleural effusions.  No pneumothorax. Visualized abdomen is unremarkable. No acute osseous abnormality. IMPRESSION: Moderate pulmonary edema and small bilateral pleural effusions. Electronically Signed   By: Jacob Moores M.D.   On: 12/31/2021 10:59    Procedures Procedures    Medications Ordered in ED Medications  albuterol (VENTOLIN HFA) 108 (90 Base) MCG/ACT inhaler 2 puff (has no administration in time range)  furosemide (LASIX) injection 40 mg (40 mg Intravenous Given 12/31/21 1135)    ED Course/  Medical Decision Making/ A&P  CRITICAL CARE Performed by: Bethann Berkshire Total critical care time: 40 minutes Critical care time was exclusive of separately billable procedures and treating other patients. Critical care was necessary to treat or prevent imminent or life-threatening deterioration. Critical care was time spent personally by me on the following activities: development of treatment plan with patient and/or surrogate as well as nursing, discussions with consultants, evaluation of patient's response to treatment, examination of patient, obtaining history from patient or surrogate, ordering and performing treatments and interventions, ordering and review of laboratory studies, ordering and review of radiographic studies, pulse oximetry and re-evaluation of patient's condition.                          Medical Decision Making Amount and/or Complexity of Data Reviewed Labs: ordered. Radiology: ordered.  Risk Prescription drug management. Decision regarding hospitalization.  This patient presents to the ED for concern of shortness of breath, this involves an extensive number of treatment options, and is a complaint that carries with it a high risk of complications and morbidity.  The differential diagnosis includes heart failure or pneumonia   Co morbidities  that complicate the patient evaluation  Congestive heart failure   Additional history obtained:  Additional history obtained from patient External records from outside source obtained and reviewed including hospital records   Lab Tests:  I Ordered, and personally interpreted labs.  The pertinent results include: CBC normal, chemistries normal   Imaging Studies ordered:  I ordered imaging studies including chest x-ray I independently visualized and interpreted imaging which showed pulmonary edema I agree with the radiologist interpretation   Cardiac Monitoring: / EKG:  The patient was maintained on a cardiac monitor.  I personally viewed and interpreted the cardiac monitored which showed an underlying rhythm of: Normal sinus rhythm   Consultations Obtained:  I requested consultation with the hospitalist,  and discussed lab and imaging findings as well as pertinent plan - they recommend: Admit   Problem List / ED Course / Critical interventions / Medication management  Congestive heart failure I ordered medication including Lasix for heart failure Reevaluation of the patient after these medicines showed that the patient improved I have reviewed the patients home medicines and have made adjustments as needed   Social Determinants of Health:  None   Test / Admission - Considered:  None  Patient with worsening congestive heart failure.  She is diuresed 1 L with Lasix.  She will be admitted to medicine for further treatment        Final Clinical Impression(s) / ED Diagnoses Final diagnoses:  Acute diastolic congestive heart failure [facility])    Rx / DC Orders ED Discharge Orders     None         Bethann Berkshire, MD 01/02/22 1115

## 2022-01-01 DIAGNOSIS — E119 Type 2 diabetes mellitus without complications: Secondary | ICD-10-CM | POA: Diagnosis present

## 2022-01-01 DIAGNOSIS — Z8249 Family history of ischemic heart disease and other diseases of the circulatory system: Secondary | ICD-10-CM | POA: Diagnosis not present

## 2022-01-01 DIAGNOSIS — I5031 Acute diastolic (congestive) heart failure: Secondary | ICD-10-CM | POA: Diagnosis present

## 2022-01-01 DIAGNOSIS — Z95810 Presence of automatic (implantable) cardiac defibrillator: Secondary | ICD-10-CM | POA: Diagnosis not present

## 2022-01-01 DIAGNOSIS — I428 Other cardiomyopathies: Secondary | ICD-10-CM | POA: Diagnosis present

## 2022-01-01 DIAGNOSIS — Z888 Allergy status to other drugs, medicaments and biological substances status: Secondary | ICD-10-CM | POA: Diagnosis not present

## 2022-01-01 DIAGNOSIS — I110 Hypertensive heart disease with heart failure: Secondary | ICD-10-CM | POA: Diagnosis present

## 2022-01-01 DIAGNOSIS — E782 Mixed hyperlipidemia: Secondary | ICD-10-CM | POA: Diagnosis present

## 2022-01-01 DIAGNOSIS — Z7951 Long term (current) use of inhaled steroids: Secondary | ICD-10-CM | POA: Diagnosis not present

## 2022-01-01 DIAGNOSIS — Z7984 Long term (current) use of oral hypoglycemic drugs: Secondary | ICD-10-CM | POA: Diagnosis not present

## 2022-01-01 DIAGNOSIS — E1165 Type 2 diabetes mellitus with hyperglycemia: Secondary | ICD-10-CM | POA: Diagnosis not present

## 2022-01-01 DIAGNOSIS — I2510 Atherosclerotic heart disease of native coronary artery without angina pectoris: Secondary | ICD-10-CM | POA: Diagnosis present

## 2022-01-01 DIAGNOSIS — Z833 Family history of diabetes mellitus: Secondary | ICD-10-CM | POA: Diagnosis not present

## 2022-01-01 DIAGNOSIS — Z7982 Long term (current) use of aspirin: Secondary | ICD-10-CM | POA: Diagnosis not present

## 2022-01-01 DIAGNOSIS — Z79899 Other long term (current) drug therapy: Secondary | ICD-10-CM | POA: Diagnosis not present

## 2022-01-01 DIAGNOSIS — I5043 Acute on chronic combined systolic (congestive) and diastolic (congestive) heart failure: Secondary | ICD-10-CM | POA: Diagnosis present

## 2022-01-01 DIAGNOSIS — Z6836 Body mass index (BMI) 36.0-36.9, adult: Secondary | ICD-10-CM | POA: Diagnosis not present

## 2022-01-01 DIAGNOSIS — Z9981 Dependence on supplemental oxygen: Secondary | ICD-10-CM | POA: Diagnosis not present

## 2022-01-01 DIAGNOSIS — J9621 Acute and chronic respiratory failure with hypoxia: Secondary | ICD-10-CM | POA: Diagnosis present

## 2022-01-01 DIAGNOSIS — J9611 Chronic respiratory failure with hypoxia: Secondary | ICD-10-CM | POA: Diagnosis not present

## 2022-01-01 LAB — GLUCOSE, CAPILLARY
Glucose-Capillary: 98 mg/dL (ref 70–99)
Glucose-Capillary: 113 mg/dL — ABNORMAL HIGH (ref 70–99)
Glucose-Capillary: 134 mg/dL — ABNORMAL HIGH (ref 70–99)
Glucose-Capillary: 190 mg/dL — ABNORMAL HIGH (ref 70–99)

## 2022-01-01 LAB — BASIC METABOLIC PANEL
Anion gap: 5 (ref 5–15)
BUN: 17 mg/dL (ref 8–23)
CO2: 33 mmol/L — ABNORMAL HIGH (ref 22–32)
Calcium: 9.7 mg/dL (ref 8.9–10.3)
Chloride: 104 mmol/L (ref 98–111)
Creatinine, Ser: 1.02 mg/dL — ABNORMAL HIGH (ref 0.44–1.00)
GFR, Estimated: 58 mL/min — ABNORMAL LOW (ref >60)
Glucose, Bld: 102 mg/dL — ABNORMAL HIGH (ref 70–99)
Potassium: 4.2 mmol/L (ref 3.5–5.1)
Sodium: 142 mmol/L (ref 135–145)

## 2022-01-01 NOTE — Hospital Course (Signed)
71 year old woman PMH combined systolic and diastolic CHF, chronic hypoxic respiratory failure, asthma, diabetes presented with increasing shortness of breath and lower extremity edema. Admitted for acute on chronic combined systolic and diastolic CHF, acute on chronic hypoxic respiratory failure.  Slowly improving with IV diuresis.  Home when euvolemic.

## 2022-01-01 NOTE — Progress Notes (Addendum)
  Progress Note   Patient: Anna Beltran GYI:[id number] DOB: [birth year] DOA: 12/31/2021     0 DOS: the patient was seen and examined on 01/01/2022   Brief hospital course: 71 year old woman PMH combined systolic and diastolic CHF, chronic hypoxic respiratory failure, asthma, diabetes presented with increasing shortness of breath and lower extremity edema.  Admitted for acute on chronic combined systolic and diastolic CHF, acute on chronic hypoxic respiratory failure.  Assessment and Plan: Acute on chronic combined systolic and diastolic CHF with pulmonary edema, acute on chronic hypoxic respiratory failure, orthopnea and increasing dyspnea on exertion. -- Echo October 2022 LVEF 40%, grade 1 diastolic dysfunction. -- Improving with diuresis, decrease lower extremity edema.  Creatinine somewhat up, will need to monitor closely with repeat study in AM.  For now continue diuresis.  Wean oxygen as tolerated. --Chronic hypoxic respiratory failure on 2-1/2 L.  Uses at night and sometimes during the day.   Asthma -- Remained stable.  Will continue bronchodilator.   Diabetes mellitus type 2 -- CBG stable.  Continue sliding scale insulin.  Hold oral agents.  Obesity  Body mass index is 36.52 kg/m.   Overall improving, likely home in the next 48 hours.     Subjective:  Feels better, but breathing not back to normal yet Eating fine Decreased LE edema  Physical Exam: Vitals:   01/01/22 0100 01/01/22 0432 01/01/22 0500 01/01/22 1431  BP: 118/80 128/84  118/79  Pulse: 64 64  64  Resp:    20  Temp: 97.9 F (36.6 C) 98 F (36.7 C)  97.8 F (36.6 C)  TempSrc: Oral Oral  Oral  SpO2: 95% 95%  92%  Weight:   96.5 kg   Height:       Physical Exam Vitals reviewed.  Constitutional:      General: She is not in acute distress.    Appearance: She is not ill-appearing or toxic-appearing.  Cardiovascular:     Rate and Rhythm: Normal rate and regular rhythm.     Heart sounds: No murmur heard.     Comments: Telemetry SR Pulmonary:     Effort: Pulmonary effort is normal. No respiratory distress.     Breath sounds: Rales (posteriorly) present. No wheezing or rhonchi.  Musculoskeletal:     Right lower leg: Edema present.     Left lower leg: Edema present.  Neurological:     Mental Status: She is alert.  Psychiatric:        Mood and Affect: Mood normal.        Behavior: Behavior normal.     Data Reviewed:  UOP 1110 Creatinine up to 1.02  Family Communication: husband at bedside  Disposition: Status is: Observation Still has SOB, pulm edema, volume overload, will convert to inpatient  Planned Discharge Destination: Home    Time spent: 25 minutes  Author: Brendia Sacks, MD 01/01/2022 6:32 PM  For on call review [email or web address].

## 2022-01-02 DIAGNOSIS — I5043 Acute on chronic combined systolic (congestive) and diastolic (congestive) heart failure: Secondary | ICD-10-CM | POA: Diagnosis not present

## 2022-01-02 DIAGNOSIS — E119 Type 2 diabetes mellitus without complications: Secondary | ICD-10-CM | POA: Diagnosis not present

## 2022-01-02 LAB — BASIC METABOLIC PANEL
Anion gap: 7 (ref 5–15)
BUN: 18 mg/dL (ref 8–23)
CO2: 32 mmol/L (ref 22–32)
Calcium: 9.7 mg/dL (ref 8.9–10.3)
Chloride: 101 mmol/L (ref 98–111)
Creatinine, Ser: 0.98 mg/dL (ref 0.44–1.00)
GFR, Estimated: >60 mL/min (ref >60)
Glucose, Bld: 113 mg/dL — ABNORMAL HIGH (ref 70–99)
Potassium: 3.7 mmol/L (ref 3.5–5.1)
Sodium: 140 mmol/L (ref 135–145)

## 2022-01-02 LAB — GLUCOSE, CAPILLARY
Glucose-Capillary: 118 mg/dL — ABNORMAL HIGH (ref 70–99)
Glucose-Capillary: 141 mg/dL — ABNORMAL HIGH (ref 70–99)
Glucose-Capillary: 116 mg/dL — ABNORMAL HIGH (ref 70–99)
Glucose-Capillary: 173 mg/dL — ABNORMAL HIGH (ref 70–99)

## 2022-01-02 NOTE — Progress Notes (Signed)
Patient weaned to 3 L O2 [place].  After talking, SpO2 90%.  After using incentive spirometer, SpO2 95%.  Bradd Burner, RN

## 2022-01-02 NOTE — Progress Notes (Signed)
  Progress Note   Patient: Anna Beltran DOB: June 24, 1950 DOA: 12/31/2021     1 DOS: the patient was seen and examined on 01/02/2022   Brief hospital course: 71 year old woman PMH combined systolic and diastolic CHF, chronic hypoxic respiratory failure, asthma, diabetes presented with increasing shortness of breath and lower extremity edema.  Admitted for acute on chronic combined systolic and diastolic CHF, acute on chronic hypoxic respiratory failure.  Assessment and Plan: Acute on chronic combined systolic and diastolic CHF with pulmonary edema, acute on chronic hypoxic respiratory failure, orthopnea and increasing dyspnea on exertion. -- Echo October 2022 LVEF 40%, grade 1 diastolic dysfunction. -- Continues to improve with diuresis but still has lower extremity edema and rales posteriorly.  We will continue IV diuresis today.  Oxygen requirement now at baseline. --Chronic hypoxic respiratory failure on 2-1/2 L.  Uses at night and sometimes during the day.   Asthma -- Stable.  Continue bronchodilators.   Diabetes mellitus type 2 -- CBG remained stable.  Continue sliding scale insulin, hold oral agents.   Obesity  Body mass index is 36.52 kg/m.    Overall improving, likely home in the next 48 hours.      Subjective:  Feels better, breathing better, LE down  Physical Exam: Vitals:   01/02/22 0947 01/02/22 0950 01/02/22 0954 01/02/22 1240  BP:    (!) 101/58  Pulse:    65  Resp:    18  Temp:    97.8 F (36.6 C)  TempSrc:    Oral  SpO2: 99% 90% 95% 96%  Weight:      Height:       Physical Exam Vitals reviewed.  Constitutional:      General: She is not in acute distress.    Appearance: She is not ill-appearing or toxic-appearing.  Cardiovascular:     Rate and Rhythm: Normal rate and regular rhythm.     Heart sounds: No murmur heard. Pulmonary:     Effort: Pulmonary effort is normal. No respiratory distress.     Breath sounds: Rales (posterior) present.  No wheezing or rhonchi.  Musculoskeletal:     Right lower leg: Edema present.     Left lower leg: Edema present.  Neurological:     Mental Status: She is alert.  Psychiatric:        Mood and Affect: Mood normal.        Behavior: Behavior normal.     Data Reviewed:  UOP 2650 -3750 since admit Creatinine stable 0.98  Family Communication: son at bedside  Disposition: Status is: Inpatient Remains inpatient appropriate because: IV diuresis for CHF  Planned Discharge Destination: Home    Time spent: 20 minutes  Author: Brendia Sacks, MD 01/02/2022 4:05 PM  For on call review [email or web address].

## 2022-01-03 DIAGNOSIS — E1165 Type 2 diabetes mellitus with hyperglycemia: Secondary | ICD-10-CM

## 2022-01-03 DIAGNOSIS — I5043 Acute on chronic combined systolic (congestive) and diastolic (congestive) heart failure: Secondary | ICD-10-CM | POA: Diagnosis not present

## 2022-01-03 DIAGNOSIS — J9611 Chronic respiratory failure with hypoxia: Secondary | ICD-10-CM | POA: Diagnosis not present

## 2022-01-03 DIAGNOSIS — J45909 Unspecified asthma, uncomplicated: Secondary | ICD-10-CM

## 2022-01-03 LAB — BASIC METABOLIC PANEL
Anion gap: 7 (ref 5–15)
BUN: 19 mg/dL (ref 8–23)
CO2: 31 mmol/L (ref 22–32)
Calcium: 9.8 mg/dL (ref 8.9–10.3)
Chloride: 101 mmol/L (ref 98–111)
Creatinine, Ser: 0.96 mg/dL (ref 0.44–1.00)
GFR, Estimated: >60 mL/min (ref >60)
Glucose, Bld: 124 mg/dL — ABNORMAL HIGH (ref 70–99)
Potassium: 3.6 mmol/L (ref 3.5–5.1)
Sodium: 139 mmol/L (ref 135–145)

## 2022-01-03 LAB — GLUCOSE, CAPILLARY
Glucose-Capillary: 126 mg/dL — ABNORMAL HIGH (ref 70–99)
Glucose-Capillary: 139 mg/dL — ABNORMAL HIGH (ref 70–99)
Glucose-Capillary: 117 mg/dL — ABNORMAL HIGH (ref 70–99)
Glucose-Capillary: 177 mg/dL — ABNORMAL HIGH (ref 70–99)

## 2022-01-03 MED ORDER — ORAL CARE MOUTH RINSE: 15.0000 mL | OROMUCOSAL | Status: DC | PRN

## 2022-01-03 NOTE — Progress Notes (Addendum)
  Progress Note   Patient: Anna Beltran VQM:[id number] DOB: Jul 23, 1950 DOA: 12/31/2021     2 DOS: the patient was seen and examined on 01/03/2022   Brief hospital course: 71 year old woman PMH combined systolic and diastolic CHF, chronic hypoxic respiratory failure, asthma, diabetes presented with increasing shortness of breath and lower extremity edema. Admitted for acute on chronic combined systolic and diastolic CHF, acute on chronic hypoxic respiratory failure.  Slowly improving with IV diuresis.  Home when euvolemic.  Assessment and Plan: Acute on chronic combined systolic and diastolic CHF with pulmonary edema, acute on chronic hypoxic respiratory failure, orthopnea and increasing dyspnea on exertion. --Chronic hypoxic respiratory failure on 2-1/2 L.  Uses at night and sometimes during the day. -- Echo October 2022 LVEF 40%, grade 1 diastolic dysfunction. -- Continues to improve but although lower extremity edema appears resolved, she still has rales posteriorly.  And is dyspneic on exertion.  Renal function stable.  Continue IV diuresis.  Asthma -- Remained stable.  Will continue bronchodilators.   Diabetes mellitus type 2 -- CBG remains stable.  Will continue sliding scale insulin, hold oral agents.   Morbid Obesity  Body mass index is 36.52 kg/m.  Comorbidities include diabetes, chronic systolic and diastolic CHF.    Overall improving, likely home in the next 48 hours.       Subjective:  Feels better Breathing better Still needs oxygen during day (not baseline) LE much improved  Physical Exam: Vitals:   01/02/22 1806 01/02/22 2002 01/03/22 0500 01/03/22 0532  BP: 115/63 123/67  111/66  Pulse:  76  63  Resp:      Temp:  97.9 F (36.6 C)  98.1 F (36.7 C)  TempSrc:  Oral  Oral  SpO2:  98%  98%  Weight:   95.1 kg   Height:       Physical Exam Vitals reviewed.  Constitutional:      General: She is not in acute distress.    Appearance: She is not ill-appearing  or toxic-appearing.  Cardiovascular:     Rate and Rhythm: Normal rate and regular rhythm.     Heart sounds: No murmur heard.    Comments: Telemetry SR Pulmonary:     Effort: Pulmonary effort is normal. No respiratory distress.     Breath sounds: Rales (posterior base) present. No wheezing.  Musculoskeletal:     Right lower leg: No edema.     Left lower leg: No edema.  Neurological:     Mental Status: She is alert.  Psychiatric:        Mood and Affect: Mood normal.        Behavior: Behavior normal.     Data Reviewed:  UOP 2650 I/O -5.8  Family Communication: None  Disposition: Status is: Inpatient Remains inpatient appropriate because: volume overload, acute CHF  Planned Discharge Destination: Home    Time spent: 20 minutes  Author: Brendia Sacks, MD 01/03/2022 9:12 AM  For on call review [email or web address].

## 2022-01-03 NOTE — Progress Notes (Addendum)
Patient's carvedilol and furosemide given at 0818 (previous BP 111/66 at 0532).  BP at 1030 82/50 checked manually while patient in chair with feet elevated.  Patient c/o "mild dizziness and blurry vision."  Patient reports often feeling dizzy at home, and "feeling bad for so long, I don't remember what normal is anymore."  MD notified, ordered RN to hold amlodipine, hydralazine, Entresto.  Will continue to monitor.  Bradd Burner, RN

## 2022-01-04 DIAGNOSIS — J9611 Chronic respiratory failure with hypoxia: Secondary | ICD-10-CM | POA: Diagnosis not present

## 2022-01-04 DIAGNOSIS — I5043 Acute on chronic combined systolic (congestive) and diastolic (congestive) heart failure: Secondary | ICD-10-CM | POA: Diagnosis not present

## 2022-01-04 DIAGNOSIS — E1165 Type 2 diabetes mellitus with hyperglycemia: Secondary | ICD-10-CM | POA: Diagnosis not present

## 2022-01-04 LAB — BASIC METABOLIC PANEL
Anion gap: 8 (ref 5–15)
BUN: 23 mg/dL (ref 8–23)
CO2: 31 mmol/L (ref 22–32)
Calcium: 9.5 mg/dL (ref 8.9–10.3)
Chloride: 100 mmol/L (ref 98–111)
Creatinine, Ser: 1.06 mg/dL — ABNORMAL HIGH (ref 0.44–1.00)
GFR, Estimated: 55 mL/min — ABNORMAL LOW (ref >60)
Glucose, Bld: 127 mg/dL — ABNORMAL HIGH (ref 70–99)
Potassium: 3.8 mmol/L (ref 3.5–5.1)
Sodium: 139 mmol/L (ref 135–145)

## 2022-01-04 LAB — GLUCOSE, CAPILLARY
Glucose-Capillary: 127 mg/dL — ABNORMAL HIGH (ref 70–99)
Glucose-Capillary: 146 mg/dL — ABNORMAL HIGH (ref 70–99)

## 2022-01-04 MED ORDER — FUROSEMIDE 20 MG PO TABS: ORAL_TABLET | ORAL | 1 refills | Status: AC

## 2022-01-04 MED ORDER — HYDRALAZINE HCL 100 MG PO TABS: 50.0000 mg | ORAL_TABLET | Freq: Three times a day (TID) | ORAL | Status: AC

## 2022-01-04 NOTE — Discharge Summary (Signed)
Physician Discharge Summary   Patient: Anna Beltran MRN: [id number] DOB: 1950/11/03  Admit date:     12/31/2021  Discharge date: 01/04/22  Discharge Physician: Brendia Sacks   PCP: Rebecka Apley, NP   Recommendations at discharge:   Acute on chronic combined systolic and diastolic CHF --Will increase Lasix on discharge, follow-up with cardiology in about a week.  Antihypertensives adjusted to prevent dizziness and low blood pressure.  Norvasc discontinued.  Hydralazine decreased 50%.    Discharge Diagnoses: Principal Problem:   Acute on chronic combined systolic and diastolic CHF (congestive heart failure) (HCC) Active Problems:   Diabetes mellitus, type 2 (HCC)   Morbid obesity (HCC)   Chronic respiratory failure (HCC)   Asthma, chronic  Resolved Problems:   * No resolved hospital problems. *  Hospital Course: 71 year old woman PMH combined systolic and diastolic CHF, chronic hypoxic respiratory failure, asthma, diabetes presented with increasing shortness of breath and lower extremity edema. Admitted for acute on chronic combined systolic and diastolic CHF, acute on chronic hypoxic respiratory failure.  Responded very well to diuretics with significant diuresis.  Overall clinically improved, oxygen requirement returned to baseline.  Acute on chronic combined systolic and diastolic CHF with pulmonary edema, acute on chronic hypoxic respiratory failure, orthopnea and increasing dyspnea on exertion. --Chronic hypoxic respiratory failure on 2-1/2 L.  Uses at night and sometimes during the day. -- Echo October 2022 LVEF 40%, grade 1 diastolic dysfunction. -- Continues to improve, lower extremity edema resolved, appears to be about at baseline. --Oxygen requirement at baseline. --We will increase Lasix on discharge, follow-up with cardiology in about a week.  Antihypertensives adjusted to prevent dizziness and low blood pressure.  Norvasc discontinued.  Hydralazine decreased  50%.   Asthma -- Remained stable.  Continue bronchodilators.   Diabetes mellitus type 2 -- CBG remains stable.  Resume oral agents on discharge   Morbid Obesity  Body mass index is 36.52 kg/m.  Comorbidities include diabetes, chronic systolic and diastolic CHF.      Consultants:  None  Procedures performed:  None  Disposition: Home Diet recommendation:  Discharge Diet Orders (From admission, onward)     Start     Ordered   01/04/22 0000  Diet - low sodium heart healthy        01/04/22 1001   01/04/22 0000  Diet Carb Modified        01/04/22 1001           Diet Orders (From admission, onward)     Start     Ordered   01/04/22 0000  Diet - low sodium heart healthy        01/04/22 1001   01/04/22 0000  Diet Carb Modified        01/04/22 1001   12/31/21 1707  Diet heart healthy/carb modified Room service appropriate? Yes; Fluid consistency: Thin  Diet effective now       Question Answer Comment  Diet-HS Snack? Nothing   Room service appropriate? Yes   Fluid consistency: Thin      12/31/21 1706             DISCHARGE MEDICATION: Allergies as of 01/04/2022       Reactions   Isosorbide Nitrate Other (See Comments)   Very bad headaches        Medication List     STOP taking these medications    amLODipine 5 MG tablet Commonly known as: NORVASC   fluticasone 110 MCG/ACT inhaler Commonly known  as: Flovent HFA   spironolactone 25 MG tablet Commonly known as: ALDACTONE       TAKE these medications    albuterol 108 (90 Base) MCG/ACT inhaler Commonly known as: VENTOLIN HFA Inhale 2 puffs into the lungs every 6 (six) hours as needed for wheezing or shortness of breath.   aspirin 81 MG tablet Take 81 mg by mouth every evening.   carvedilol 25 MG tablet Commonly known as: COREG TAKE 1 TABLET BY MOUTH TWICE DAILY WITH MEALS   Entresto 97-103 MG Generic drug: sacubitril-valsartan Take 1 tablet by mouth 2 (two) times daily.   furosemide 20  MG tablet Commonly known as: LASIX Take 40mg  in the morning and 20mg  at 2pm Monday, Wednesday, Friday What changed:  how much to take how to take this when to take this additional instructions   glipiZIDE 5 MG tablet Commonly known as: GLUCOTROL Take 5 mg by mouth 2 (two) times daily before a meal.   hydrALAZINE 100 MG tablet Commonly known as: APRESOLINE Take 0.5 tablets (50 mg total) by mouth 3 (three) times daily. What changed: how much to take   metFORMIN 1000 MG tablet Commonly known as: GLUCOPHAGE Take 1,000 mg by mouth 2 (two) times daily with a meal.   omeprazole 20 MG capsule Commonly known as: PRILOSEC Take 1 capsule by mouth twice daily What changed:  when to take this reasons to take this   REFRESH OP Place 1 drop into both eyes 2 (two) times daily as needed (for dry eyes).        Follow-up Information     [place], Wednesday, NP Follow up.   Specialty: Adult Health Nurse Practitioner Why: As needed Contact information: [address] [place] [address] [place] [phone]         [id number], MD. Schedule an appointment as soon as possible for a visit in 1 week(s).   Specialty: Cardiology Contact information: [address] [place] [address] [place] [phone]                Feels better  Discharge Exam: Filed Weights   01/01/22 0500 01/03/22 0500 01/04/22 0500  Weight: 96.5 kg 95.1 kg 95.5 kg   Physical Exam Vitals reviewed.  Constitutional:      General: She is not in acute distress.    Appearance: She is not ill-appearing or toxic-appearing.  Cardiovascular:     Rate and Rhythm: Normal rate and regular rhythm.     Heart sounds: No murmur heard.    Comments: Telemetry SR Pulmonary:     Effort: Pulmonary effort is normal. No respiratory distress.     Breath sounds: No wheezing or rhonchi.  Musculoskeletal:     Right lower leg: No edema.     Left lower leg: No edema.  Neurological:      Mental Status: She is alert.  Psychiatric:        Mood and Affect: Mood normal.        Behavior: Behavior normal.    Condition at discharge: good  The results of significant diagnostics from this hospitalization (including imaging, microbiology, ancillary and laboratory) are listed below for reference.   Imaging Studies: DG Chest 2 View  Result Date: 12/31/2021 CLINICAL DATA:  Shortness of breath EXAM: CHEST - 2 VIEW COMPARISON:  October 22, 2021, July 23, 2012 FINDINGS: Intact left chest wall AICD. The cardiomediastinal silhouette is unchanged in contour. Bilateral moderate interstitial pulmonary opacities. Small bilateral pleural effusions.  No pneumothorax. Visualized abdomen is unremarkable. No acute osseous abnormality. IMPRESSION: Moderate pulmonary edema and small bilateral pleural effusions. Electronically Signed   By: Jacob Moores M.D.   On: 12/31/2021 10:59    Microbiology: Results for orders placed or performed during the hospital encounter of 11/28/18  SARS Coronavirus 2 (Performed in [facility] hospital lab)     Status: None   Collection Time: 11/28/18  3:37 PM   Specimen: Nasal Swab  Result Value Ref Range Status   SARS Coronavirus 2 NEGATIVE NEGATIVE Final    Comment: (NOTE) SARS-CoV-2 target nucleic acids are NOT DETECTED. The SARS-CoV-2 RNA is generally detectable in upper and lower respiratory specimens during the acute phase of infection. Negative results do not preclude SARS-CoV-2 infection, do not rule out co-infections with other pathogens, and should not be used as the sole basis for treatment or other patient management decisions. Negative results must be combined with clinical observations, patient history, and epidemiological information. The expected result is Negative. Fact Sheet for Patients: HairSlick.no Fact Sheet for Healthcare Providers: quierodirigir.com This test is not yet approved or cleared  by the [place] FDA and  has been authorized for detection and/or diagnosis of SARS-CoV-2 by FDA under an Emergency Use Authorization (EUA). This EUA will remain  in effect (meaning this test can be used) for the duration of the COVID-19 declaration under Section 56 4(b)(1) of the Act, 21 U.S.C. section 360bbb-3(b)(1), unless the authorization is terminated or revoked sooner. Performed at [facility], [address] [place], [place] [address]     Labs: CBC: Recent Labs  Lab 12/31/21 1156  WBC 4.5  NEUTROABS 3.3  HGB 15.9*  HCT 50.1*  MCV 101.2*  PLT 121*   Basic Metabolic Panel: Recent Labs  Lab 12/31/21 1156 01/01/22 0417 01/02/22 0503 01/03/22 0349 01/04/22 0337  NA 143 142 140 139 139  K 3.9 4.2 3.7 3.6 3.8  CL 110 104 101 101 100  CO2 27 33* 32 31 31  GLUCOSE 85 102* 113* 124* 127*  BUN 14 17 18 19 23   CREATININE 0.74 1.02* 0.98 0.96 1.06*  CALCIUM 8.0* 9.7 9.7 9.8 9.5   Liver Function Tests: Recent Labs  Lab 12/31/21 1156  AST 16  ALT 16  ALKPHOS 49  BILITOT 1.0  PROT 6.0*  ALBUMIN 2.9*   CBG: Recent Labs  Lab 01/03/22 0755 01/03/22 1112 01/03/22 1612 01/03/22 2002 01/04/22 0753  GLUCAP 126* 139* 117* 177* 127*    Discharge time spent: less than 30 minutes.  Signed: 01/06/22, MD [facility] 01/04/2022

## 2022-01-04 NOTE — Progress Notes (Signed)
No ICM remote transmission received for 01/04/2022 due to hospitalization and next ICM transmission scheduled for 01/19/2022.

## 2022-01-04 NOTE — Progress Notes (Signed)
  Transition of Care (TOC) Screening Note   Patient Details  Name: Anna Beltran Date of Birth: 08/22/1950   Transition of Care [facility]) CM/SW Contact:    Lavenia Atlas, RN Phone Number: 01/04/2022, 12:15 PM    Transition of Care Department [facility]) has reviewed patient and no TOC needs have been identified at this time. We will continue to monitor patient advancement through interdisciplinary progression rounds. If new patient transition needs arise, please place a TOC consult.

## 2022-01-04 NOTE — Progress Notes (Signed)
Patient discharged as ordered, portable O2 tank brought by husband for home use.

## 2022-01-05 ENCOUNTER — Ambulatory Visit (INDEPENDENT_AMBULATORY_CARE_PROVIDER_SITE_OTHER): Payer: Medicare HMO

## 2022-01-05 DIAGNOSIS — Z95810 Presence of automatic (implantable) cardiac defibrillator: Secondary | ICD-10-CM | POA: Diagnosis not present

## 2022-01-05 DIAGNOSIS — I5042 Chronic combined systolic (congestive) and diastolic (congestive) heart failure: Secondary | ICD-10-CM

## 2022-01-05 NOTE — Progress Notes (Signed)
EPIC Encounter for ICM Monitoring  Patient Name: Anna Beltran is a 71 y.o. female Date: 01/05/2022 Primary Care Physican: Rebecka Apley, NP Primary Cardiologist: Jens Som Electrophysiologist: Lalla Brothers 10/27/2021 Weight: 224 lbs 10/30/2021 Weight: 221 lbs (baseline 220-222 lbs) 01/08/2022 Weight: 211 lbs (after hospital discharge)                                               Spoke with patient and heart failure questions reviewed.  Pt reports difficulty managing fluid accumulation and was hospitalized 8/17-8/21 for dx acute CHF. She is feeling much better and lost 20 lbs of fluid.     Optivol thoracic impedance suggesting possible fluid accumulation starting 7/18 and returned to normal after being diuresed in hospital.   Prescribed:  Furosemide 20 mg Take 40mg  in the morning and 20mg  at 2pm Monday, Wednesday, Friday Spironolactone 25 mg take 1 tablet daily   Labs: 01/04/2022 Creatinine 1.06, BUN 23, Potassium 3.8, Sodium 139, GFR 55 01/03/2022 Creatinine 0.96, BUN 19, Potassium 3.6, Sodium 139, GFR >60  01/02/2022 Creatinine 0.98, BUN 18, Potassium 3.7, Sodium 140, GFR >60  01/01/2022 Creatinine 1.02, BUN 17, Potassium 4.2, Sodium 142, GFR 58  12/31/2021 Creatinine 0.74, BUN 14, Potassium 3.9, Sodium 143, GFR >60  A complete set of results can be found in Results Review.    Recommendations:  Discussed limiting fluid intake to 64 oz daily and salt intake to 2000 mg daily.  Advised to call if fluid symptoms return.      Follow-up plan: ICM clinic phone appointment on 01/13/2022 to recheck fluid levels.    91 day device clinic remote transmission 01/12/2022.     EP/Cardiology Office Visits:   01/14/2022 with 01/14/2022, PA (post hospital visit).  Recall 11/21/2022 with Genelle Gather, PA.   Copy of ICM check sent to Dr. 01/22/2023 (replacing Dr Otilio Saber).      3 month ICM trend: 01/04/2022.    12-14 Month ICM trend:     Johney Frame, RN 01/05/2022 7:15 AM

## 2022-01-08 NOTE — Progress Notes (Unsigned)
Cardiology Office Note:    Date:  01/14/2022   ID:  Anna Beltran, DOB 08-Feb-1951, MRN [id number]  PCP:  Rebecka Apley, NP   [facility] Providers Cardiologist:  Olga Millers, MD Cardiology APP:  Marcelino Duster, PA  Electrophysiologist:  Hillis Range, MD { Referring MD: Rebecka Apley, NP   Chief Complaint  Patient presents with   Hospitalization Follow-up    CHF    History of Present Illness:    Anna Beltran is a 71 y.o. female with a hx of chronic combined systolic and diastolic heart failure, nonischemic cardiomyopathy, ICD in place, chronic respiratory failure, DM, nonobstructive CAD by LHC in 2014, mild carotid artery stenosis, HTN, HLD, obesity, and PUD.  Patient has an LVEF as low as 25 to 30% but improved to 40% on last echo 02/2021.  ICD was placed 01/2014 for primary prevention.  Given nonobstructive CAD by heart cath in 2014, this was presumed nonischemic cardiomyopathy.  Heart monitor 06/2021 was performed to evaluate self-reported bradycardia that showed an underlying sinus rhythm with an average heart rate of 66 (min heart rate 50, max heart rate 95 bpm).  Bradycardia felt due to on capture PACs and PVCs.  She is on chronic O2 at home but often does not use oxygen when ambulating.  She has in the past presented to outpatient clinic without her oxygen and was hypoxic.  Her dry weight is felt to be 2 to 2 pounds.  She was recently hospitalized 12/31/2021 with increasing SOB and was diuresed. She was discharged on 01/04/22 with medications adjustments to include: Increased lasix to 40/20 Norvasc and spironolactone discontinued due to dizziness and marginal BP Hydralazine reduced by 50%  Discharged on coreg, entresto 97-103 mg BID, lasix 40 / 20 mg daily, 50 mg hydralazine TID.   She presents today for hospitalization follow-up.  Overall she is doing well.  Upon entering the room she was not wearing her oxygen and O2 sats were 80%.  I placed her  nasal cannula and turned her oxygen to 3 L and her O2 sat returned to 92%.  She is in a wheelchair today, but states that she can still walk in [place].  She thinks that she has some mild congestion that has been stable since leaving the hospital.  From a fluid standpoint, she thinks that she is doing quite well.   She does report some intermittent dizziness.  Blood pressure log from home reveals blood pressures in the 70s to 80s/40s to 50s.  She is still taking 100 mg hydralazine 3 times daily, she did not cut her hydralazine in half as instructed at discharge.  We will start 50 mg hydralazine 3 times daily and follow symptoms.  They will call me if her blood pressure is not improved with this change.   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   Chronic combined systolic and diastolic CHF (congestive heart failure) (HCC)    MPI 09/05/12 NORMAL, showed no scar & LVEF is 20-25%. ECHO 12/11/12 LV is markedly dilated, Overall LV systolic function severely impaired with EF = 25-30%, pseudonormal LV filling pattern (consistant with elevated LA pressure).   Chronic respiratory failure (HCC)  on supplemental O2 at night and as needed   Diabetes mellitus, type 2 (HCC)    TYPE 2   Essential hypertension, benign    Hyperlipidemia, mixed    Hypersomnia, unspecified    Non-ischemic cardiomyopathy (HCC)    Obesity    PUD (peptic ulcer disease)    Unspecified menopausal and postmenopausal disorder     Past Surgical History:  Procedure Laterality Date   CARDIAC DEFIBRILLATOR PLACEMENT  01/17/2014   DR Johney Frame   EXCISION OF SKIN TAG N/A 12/01/2018   Procedure: EXCISION OF SKIN TAG ANAL;  Surgeon: Romie Levee, MD;   Location: [facility];  Service: General;  Laterality: N/A;   IMPLANTABLE CARDIOVERTER DEFIBRILLATOR IMPLANT N/A 01/17/2014   Procedure: IMPLANTABLE CARDIOVERTER DEFIBRILLATOR IMPLANT;  Surgeon: Gardiner Rhyme, MD;  Location: [facility] CATH LAB;  Service: Cardiovascular;  Laterality: N/A;   LEFT AND RIGHT HEART CATHETERIZATION WITH CORONARY ANGIOGRAM N/A 02/27/2013   Procedure: LEFT AND RIGHT HEART CATHETERIZATION WITH CORONARY ANGIOGRAM;  Surgeon: Rollene Rotunda, MD;  Location: [facility] CATH LAB;  Service: Cardiovascular;  Laterality: N/A;   none      Current Medications: Current Meds  Medication Sig   albuterol (VENTOLIN HFA) 108 (90 Base) MCG/ACT inhaler Inhale 2 puffs into the lungs every 6 (six) hours as needed for wheezing or shortness of breath.   aspirin 81 MG tablet Take 81 mg by mouth every evening.   carvedilol (COREG) 25 MG tablet TAKE 1 TABLET BY MOUTH TWICE DAILY WITH MEALS (Patient taking differently: Take 25 mg by mouth 2 (two) times daily with a meal.)   furosemide (LASIX) 20 MG tablet Take 40mg  in the morning and 20mg  at 2pm Monday, Wednesday, Friday   glipiZIDE (GLUCOTROL) 5 MG tablet Take 5 mg by mouth 2 (two) times daily before a meal.    hydrALAZINE (APRESOLINE) 100 MG tablet Take 0.5 tablets (50 mg total) by mouth 3 (three) times daily.   metFORMIN (GLUCOPHAGE) 1000 MG tablet Take 1,000 mg by mouth 2 (two) times daily with a meal.    omeprazole (PRILOSEC) 20 MG capsule Take 1 capsule by mouth twice daily (Patient taking differently: Take 20 mg by mouth daily as needed (for heatburn).)   Polyvinyl Alcohol-Povidone (REFRESH OP) Place 1 drop into both eyes 2 (two) times daily as needed (for dry eyes).    rosuvastatin (CRESTOR) 20 MG tablet Take 1 tablet (20 mg total) by mouth daily.   sacubitril-valsartan (ENTRESTO) 97-103 MG Take 1 tablet by mouth 2 (two) times daily.     Allergies:   Isosorbide nitrate   Social History   Socioeconomic History   Marital status: Married    Spouse name:  Not on file   Number of children: 5   Years of education: Not on file   Highest education level: Not on file  Occupational History    Comment: Retired  Tobacco Use   Smoking status: Never   Smokeless tobacco: Never  Vaping Use   Vaping Use: Never used  Substance and Sexual Activity   Alcohol use: No   Drug use: No   Sexual activity: Never  Other Topics Concern   Not on file  Social History Narrative   [place] and son.  Retired from [employer] Strain: Not on file  Food Insecurity: Not on file  Transportation Needs: Not on file  Physical Activity: Not on file  Stress: Not on file  Social Connections:  Not on file     Family History: The patient's family history includes Cancer in her brother; Diabetes Mellitus II in her mother; Hypertension in her father.  ROS:   Please see the history of present illness.     All other systems reviewed and are negative.  EKGs/Labs/Other Studies Reviewed:    The following studies were reviewed today:  Echo 02/2021:  1. Left ventricular ejection fraction, by estimation, is 40%. The left  ventricle has mildly decreased function. The left ventricle demonstrates  global hypokinesis. There is moderate left ventricular hypertrophy. Left  ventricular diastolic parameters are   consistent with Grade I diastolic dysfunction (impaired relaxation).   2. Right ventricular systolic function is mildly reduced. The right  ventricular size is mildly enlarged. There is moderately elevated  pulmonary artery systolic pressure. The estimated right ventricular  systolic pressure is 54.3 mmHg.   3. The mitral valve is grossly normal. Trivial mitral valve  regurgitation. No evidence of mitral stenosis.   4. Tricuspid valve regurgitation is moderate.   5. Pulmonic valve regurgitation is moderate.   6. The aortic valve is normal in structure. Aortic valve regurgitation is  not  visualized. No aortic stenosis is present.   7. The inferior vena cava is normal in size with greater than 50%  respiratory variability, suggesting right atrial pressure of 3 mmHg.  EKG:  EKG is not ordered today.   Recent Labs: 07/07/2021: NT-Pro BNP 699 12/31/2021: ALT 16; B Natriuretic Peptide 312.6; Hemoglobin 15.9; Platelets 121 01/04/2022: BUN 23; Creatinine, Ser 1.06; Potassium 3.8; Sodium 139  Recent Lipid Panel    Component Value Date/Time   CHOL 186 08/04/2020 1547   TRIG 173 (H) 08/04/2020 1547   HDL 43 08/04/2020 1547   CHOLHDL 4.3 08/04/2020 1547   CHOLHDL 5.0 (H) 03/22/2016 0829   VLDL 37 (H) 03/22/2016 0829   LDLCALC 113 (H) 08/04/2020 1547     Risk Assessment/Calculations:                Physical Exam:    VS:  BP 132/78   Pulse 70   Resp 18   Ht 5\' 4"  (1.626 m)   Wt 211 lb (95.7 kg)   SpO2 91%   BMI 36.22 kg/m     Wt Readings from Last 3 Encounters:  01/14/22 211 lb (95.7 kg)  01/04/22 210 lb 8.6 oz (95.5 kg)  12/21/21 222 lb (100.7 kg)     GEN:  Well nourished, well developed in no acute distress HEENT: Normal NECK: No JVD; No carotid bruits LYMPHATICS: No lymphadenopathy CARDIAC: RRR, no murmurs, rubs, gallops RESPIRATORY:  respirations unlabored, crackles in left base ABDOMEN: Soft, non-tender, non-distended MUSCULOSKELETAL:  No edema; No deformity  SKIN: Warm and dry NEUROLOGIC:  Alert and oriented x 3 PSYCHIATRIC:  Normal affect   ASSESSMENT:    1. Chronic combined systolic and diastolic CHF (congestive heart failure) (HCC)   2. ICD (implantable cardioverter-defibrillator) in place   3. Non-ischemic cardiomyopathy (HCC)   4. Primary hypertension   5. Coronary artery disease due to lipid rich plaque   6. Hyperlipidemia, unspecified hyperlipidemia type   7. Chronic respiratory failure with hypoxia (HCC)    PLAN:    In order of problems listed above:  Chronic systolic and diastolic heart failure Nonischemic cardiomyopathy ICD  in place Hypertension - GDMT includes Coreg, Entresto, Lasix, and hydralazine - BP today 130s, but she has not taken her medications - BP log from home shows Bps 70/40s and  she is symptomatic with this - with further questioning, she is still taking 100 mg hydralazine TID instead of 0.5 tablets (50 mg) TID  She does have crackles in her left base.  She sleeps sitting up at baseline.  We discussed using 40 mg of Lasix twice daily x3 days.  She is very good about adjusting her Lasix and will do this if she feels the congestion is worsening.  She does not want to make any changes today.   Nonobstructive CAD Hyperlipidemia Continue aspirin She is not on a statin Lipid panel 07/2020: Total cholesterol 186 HDL: 43 LDL: 113 Triglycerides: 173 Start 20 mg Crestor   Chronic respiratory failure Patient intermittently uses oxygen at home O2 sat improved on 3 L of oxygen in the office    Follow up with me or Dr. Jens Som in 3 months.      Medication Adjustments/Labs and Tests Ordered: Current medicines are reviewed at length with the patient today.  Concerns regarding medicines are outlined above.  No orders of the defined types were placed in this encounter.  Meds ordered this encounter  Medications   rosuvastatin (CRESTOR) 20 MG tablet    Sig: Take 1 tablet (20 mg total) by mouth daily.    Dispense:  90 tablet    Refill:  3    Patient Instructions  Medication Instructions:   -Take hydralazine (apresoline) 1/2 tablet (50mg ) three times a day.  -Start taking rosuvastatin (crestor) 20mg  once daily.  *If you need a refill on your cardiac medications before your next appointment, please call your pharmacy*    Follow-Up: At [facility], you and your health needs are our priority.  As part of our continuing mission to provide you with exceptional heart care, we have created designated Provider Care Teams.  These Care Teams include your primary Cardiologist (physician)  and Advanced Practice Providers (APPs -  Physician Assistants and Nurse Practitioners) who all work together to provide you with the care you need, when you need it.  We recommend signing up for the patient portal called "MyChart".  Sign up information is provided on this After Visit Summary.  MyChart is used to connect with patients for Virtual Visits (Telemedicine).  Patients are able to view lab/test results, encounter notes, upcoming appointments, etc.  Non-urgent messages can be sent to your provider as well.   To learn more about what you can do with MyChart, go to .    Your next appointment:   3 month(s)  The format for your next appointment:   In Person  Provider:   [facility], PA-C or  [email or web address], MD    Signed, Micah Flesher [place], Roe Rutherford  01/14/2022 9:04 AM    [facility]

## 2022-01-12 ENCOUNTER — Ambulatory Visit (INDEPENDENT_AMBULATORY_CARE_PROVIDER_SITE_OTHER): Payer: Medicare HMO

## 2022-01-12 DIAGNOSIS — I428 Other cardiomyopathies: Secondary | ICD-10-CM | POA: Diagnosis not present

## 2022-01-12 LAB — CUP PACEART REMOTE DEVICE CHECK
Battery Remaining Longevity: 40 mo
Battery Voltage: 2.96 V
Brady Statistic RV Percent Paced: 0.01 %
Date Time Interrogation Session: 20230829022725
HighPow Impedance: 63 Ohm
Implantable Lead Implant Date: 20150903
Implantable Lead Location: 753860
Implantable Lead Model: 6935
Implantable Pulse Generator Implant Date: 20150903
Lead Channel Impedance Value: 304 Ohm
Lead Channel Impedance Value: 399 Ohm
Lead Channel Pacing Threshold Amplitude: 0.875 V
Lead Channel Pacing Threshold Pulse Width: 0.4 ms
Lead Channel Sensing Intrinsic Amplitude: 15.75 mV
Lead Channel Sensing Intrinsic Amplitude: 15.75 mV
Lead Channel Setting Pacing Amplitude: 2 V
Lead Channel Setting Pacing Pulse Width: 0.4 ms
Lead Channel Setting Sensing Sensitivity: 0.3 mV

## 2022-01-13 ENCOUNTER — Telehealth: Payer: Self-pay

## 2022-01-13 ENCOUNTER — Ambulatory Visit (INDEPENDENT_AMBULATORY_CARE_PROVIDER_SITE_OTHER): Payer: Medicare HMO

## 2022-01-13 DIAGNOSIS — I5042 Chronic combined systolic (congestive) and diastolic (congestive) heart failure: Secondary | ICD-10-CM

## 2022-01-13 DIAGNOSIS — Z95810 Presence of automatic (implantable) cardiac defibrillator: Secondary | ICD-10-CM

## 2022-01-13 NOTE — Telephone Encounter (Signed)
Remote ICM transmission received.  Attempted call to patient regarding ICM remote transmission and left detailed message per DPR.  Advised to return call for any fluid symptoms or questions. Next ICM remote transmission scheduled 01/26/2022.    

## 2022-01-13 NOTE — Progress Notes (Signed)
EPIC Encounter for ICM Monitoring  Patient Name: Anna Beltran is a 71 y.o. female Date: 01/13/2022 Primary Care Physican: Rebecka Apley, NP Primary Cardiologist: Jens Som Electrophysiologist: Lalla Brothers 10/27/2021 Weight: 224 lbs 10/30/2021 Weight: 221 lbs (baseline 220-222 lbs) 01/08/2022 Weight: 211 lbs (after hospital discharge)                                               Attempted call to patient and unable to reach.  Left detailed message per DPR regarding transmission. Transmission reviewed. Hospitalized 8/17-8/21 for dx acute CHF diuresed pt reported on 01/05/22 a 20 lbs fluid wt loss.     Optivol thoracic impedance suggesting possible fluid accumulation starting 7/18 and returned to normal after being diuresed in hospital.   Prescribed:  Furosemide 20 mg Take 40mg  in the morning and 20mg  at 2pm Monday, Wednesday, Friday Spironolactone 25 mg take 1 tablet daily   Labs: 01/04/2022 Creatinine 1.06, BUN 23, Potassium 3.8, Sodium 139, GFR 55 01/03/2022 Creatinine 0.96, BUN 19, Potassium 3.6, Sodium 139, GFR >60  01/02/2022 Creatinine 0.98, BUN 18, Potassium 3.7, Sodium 140, GFR >60  01/01/2022 Creatinine 1.02, BUN 17, Potassium 4.2, Sodium 142, GFR 58  12/31/2021 Creatinine 0.74, BUN 14, Potassium 3.9, Sodium 143, GFR >60  A complete set of results can be found in Results Review.    Recommendations:  Left voice mail with ICM number and encouraged to call if experiencing any fluid symptoms.    Follow-up plan: ICM clinic phone appointment on 01/26/2022 to recheck fluid levels.    91 day device clinic remote transmission 01/12/2022.     EP/Cardiology Office Visits:   01/14/2022 with 01/14/2022, PA (post hospital visit).  Recall 11/21/2022 with Micah Flesher, PA.   Copy of ICM check sent to Dr. 01/22/2023 (replacing Dr Otilio Saber) and Lalla Brothers PA for review at 8/31 OV if needed.      3 month ICM trend: 01/12/2022.    12-14 Month ICM trend:     9/31, RN 01/13/2022 3:06  PM

## 2022-01-14 ENCOUNTER — Encounter: Payer: Self-pay | Admitting: Physician Assistant

## 2022-01-14 ENCOUNTER — Ambulatory Visit: Payer: Medicare HMO | Attending: Physician Assistant | Admitting: Physician Assistant

## 2022-01-14 VITALS — BP 132/78 | HR 70 | Resp 18 | Ht 64.0 in | Wt 211.0 lb

## 2022-01-14 DIAGNOSIS — I2583 Coronary atherosclerosis due to lipid rich plaque: Secondary | ICD-10-CM

## 2022-01-14 DIAGNOSIS — E785 Hyperlipidemia, unspecified: Secondary | ICD-10-CM

## 2022-01-14 DIAGNOSIS — I10 Essential (primary) hypertension: Secondary | ICD-10-CM

## 2022-01-14 DIAGNOSIS — I428 Other cardiomyopathies: Secondary | ICD-10-CM | POA: Diagnosis not present

## 2022-01-14 DIAGNOSIS — Z95810 Presence of automatic (implantable) cardiac defibrillator: Secondary | ICD-10-CM

## 2022-01-14 DIAGNOSIS — I5042 Chronic combined systolic (congestive) and diastolic (congestive) heart failure: Secondary | ICD-10-CM | POA: Diagnosis not present

## 2022-01-14 DIAGNOSIS — J9611 Chronic respiratory failure with hypoxia: Secondary | ICD-10-CM

## 2022-01-14 DIAGNOSIS — I2510 Atherosclerotic heart disease of native coronary artery without angina pectoris: Secondary | ICD-10-CM

## 2022-01-14 MED ORDER — ROSUVASTATIN CALCIUM 20 MG PO TABS: 20.0000 mg | ORAL_TABLET | Freq: Every day | ORAL | 3 refills | Status: AC

## 2022-01-14 NOTE — Patient Instructions (Signed)
Medication Instructions:   -Take hydralazine (apresoline) 1/2 tablet (50mg ) three times a day.  -Start taking rosuvastatin (crestor) 20mg  once daily.  *If you need a refill on your cardiac medications before your next appointment, please call your pharmacy*    Follow-Up: At [facility], you and your health needs are our priority.  As part of our continuing mission to provide you with exceptional heart care, we have created designated Provider Care Teams.  These Care Teams include your primary Cardiologist (physician) and Advanced Practice Providers (APPs -  Physician Assistants and Nurse Practitioners) who all work together to provide you with the care you need, when you need it.  We recommend signing up for the patient portal called "MyChart".  Sign up information is provided on this After Visit Summary.  MyChart is used to connect with patients for Virtual Visits (Telemedicine).  Patients are able to view lab/test results, encounter notes, upcoming appointments, etc.  Non-urgent messages can be sent to your provider as well.   To learn more about what you can do with MyChart, go to .    Your next appointment:   3 month(s)  The format for your next appointment:   In Person  Provider:   [facility], PA-C or  [email or web address], MD

## 2022-01-25 ENCOUNTER — Ambulatory Visit (HOSPITAL_COMMUNITY): Payer: Medicare HMO

## 2022-01-26 ENCOUNTER — Ambulatory Visit (INDEPENDENT_AMBULATORY_CARE_PROVIDER_SITE_OTHER): Payer: Medicare HMO

## 2022-01-26 DIAGNOSIS — Z95810 Presence of automatic (implantable) cardiac defibrillator: Secondary | ICD-10-CM

## 2022-01-26 DIAGNOSIS — I5042 Chronic combined systolic (congestive) and diastolic (congestive) heart failure: Secondary | ICD-10-CM

## 2022-01-26 NOTE — Progress Notes (Signed)
EPIC Encounter for ICM Monitoring  Patient Name: Anna Beltran is a 71 y.o. female Date: 01/26/2022 Primary Care Physican: Rebecka Apley, NP Primary Cardiologist: Jens Som Electrophysiologist: Lalla Brothers 10/27/2021 Weight: 224 lbs 10/30/2021 Weight: 221 lbs (baseline 220-222 lbs) 01/08/2022 Weight: 211 lbs (after hospital discharge) 01/26/2022 Weight: 213 lbs                                               Spoke with patient and heart failure questions reviewed.  Pt is feeling okay since hospital discharge.  She is working on changing her eating habits and eliminating (or decreasing) salt in the diet.   She weighs daily.      Optivol thoracic impedance suggesting normal fluid levels after taking Furosemide 40 mg bid x 3 days as instructed at 8/31 OV.     Prescribed:  Furosemide 20 mg Take 40mg  in the morning and 20mg  at 2pm Monday, Wednesday, Friday.  Per 01/14/2022 OV note by Thursday, PA she is aware patient adjusts Furosemide as needed.  Spironolactone 25 mg take 1 tablet daily   Labs: 01/04/2022 Creatinine 1.06, BUN 23, Potassium 3.8, Sodium 139, GFR 55 01/03/2022 Creatinine 0.96, BUN 19, Potassium 3.6, Sodium 139, GFR >60  01/02/2022 Creatinine 0.98, BUN 18, Potassium 3.7, Sodium 140, GFR >60  01/01/2022 Creatinine 1.02, BUN 17, Potassium 4.2, Sodium 142, GFR 58  12/31/2021 Creatinine 0.74, BUN 14, Potassium 3.9, Sodium 143, GFR >60  A complete set of results can be found in Results Review.    Recommendations:   Discussed diet.  No changes and encouraged to call if experiencing any fluid symptoms.   Follow-up plan: ICM clinic phone appointment on 02/15/2022.    91 day device clinic remote transmission 04/13/2022.     EP/Cardiology Office Visits:   04/05/2022 with 04/15/2022, PA.  Recall 11/21/2022 with Micah Flesher, PA.   Copy of ICM check sent to Dr. 01/22/2023   3 month ICM trend: 01/25/2022.    12-14 Month ICM trend:     Lalla Brothers, RN 01/26/2022 12:56 PM

## 2022-02-05 NOTE — Progress Notes (Signed)
Remote ICD transmission.   

## 2022-02-14 DEATH — deceased

## 2022-02-18 ENCOUNTER — Telehealth: Payer: Self-pay | Admitting: Cardiology

## 2022-02-18 NOTE — Telephone Encounter (Signed)
Caller stated this deceased patient's death certificate is pending Dr. Jacalyn Lefevre signature in [place].  Ref# [id number].  Caller stated the time of death was 6:55 am, 11-Mar-2022.

## 2022-02-18 NOTE — Telephone Encounter (Signed)
Returned call to funeral home and made them aware that Dr. Stanford Breed had already completed.   Lelon Perla, MD  You 5 hours ago (11:28 AM)    I have completed this  Kirk Ruths

## 2022-04-05 ENCOUNTER — Ambulatory Visit: Payer: Medicare HMO | Admitting: Physician Assistant

## 2024-01-21 IMAGING — DX DG CHEST 2V
2 series · 2 of 2 positions shown · non-contrast
Comparison: Chest x-ray 11/22/2019

CLINICAL DATA: Dyspnea.

EXAM:
CHEST - 2 VIEW

[chest pa]
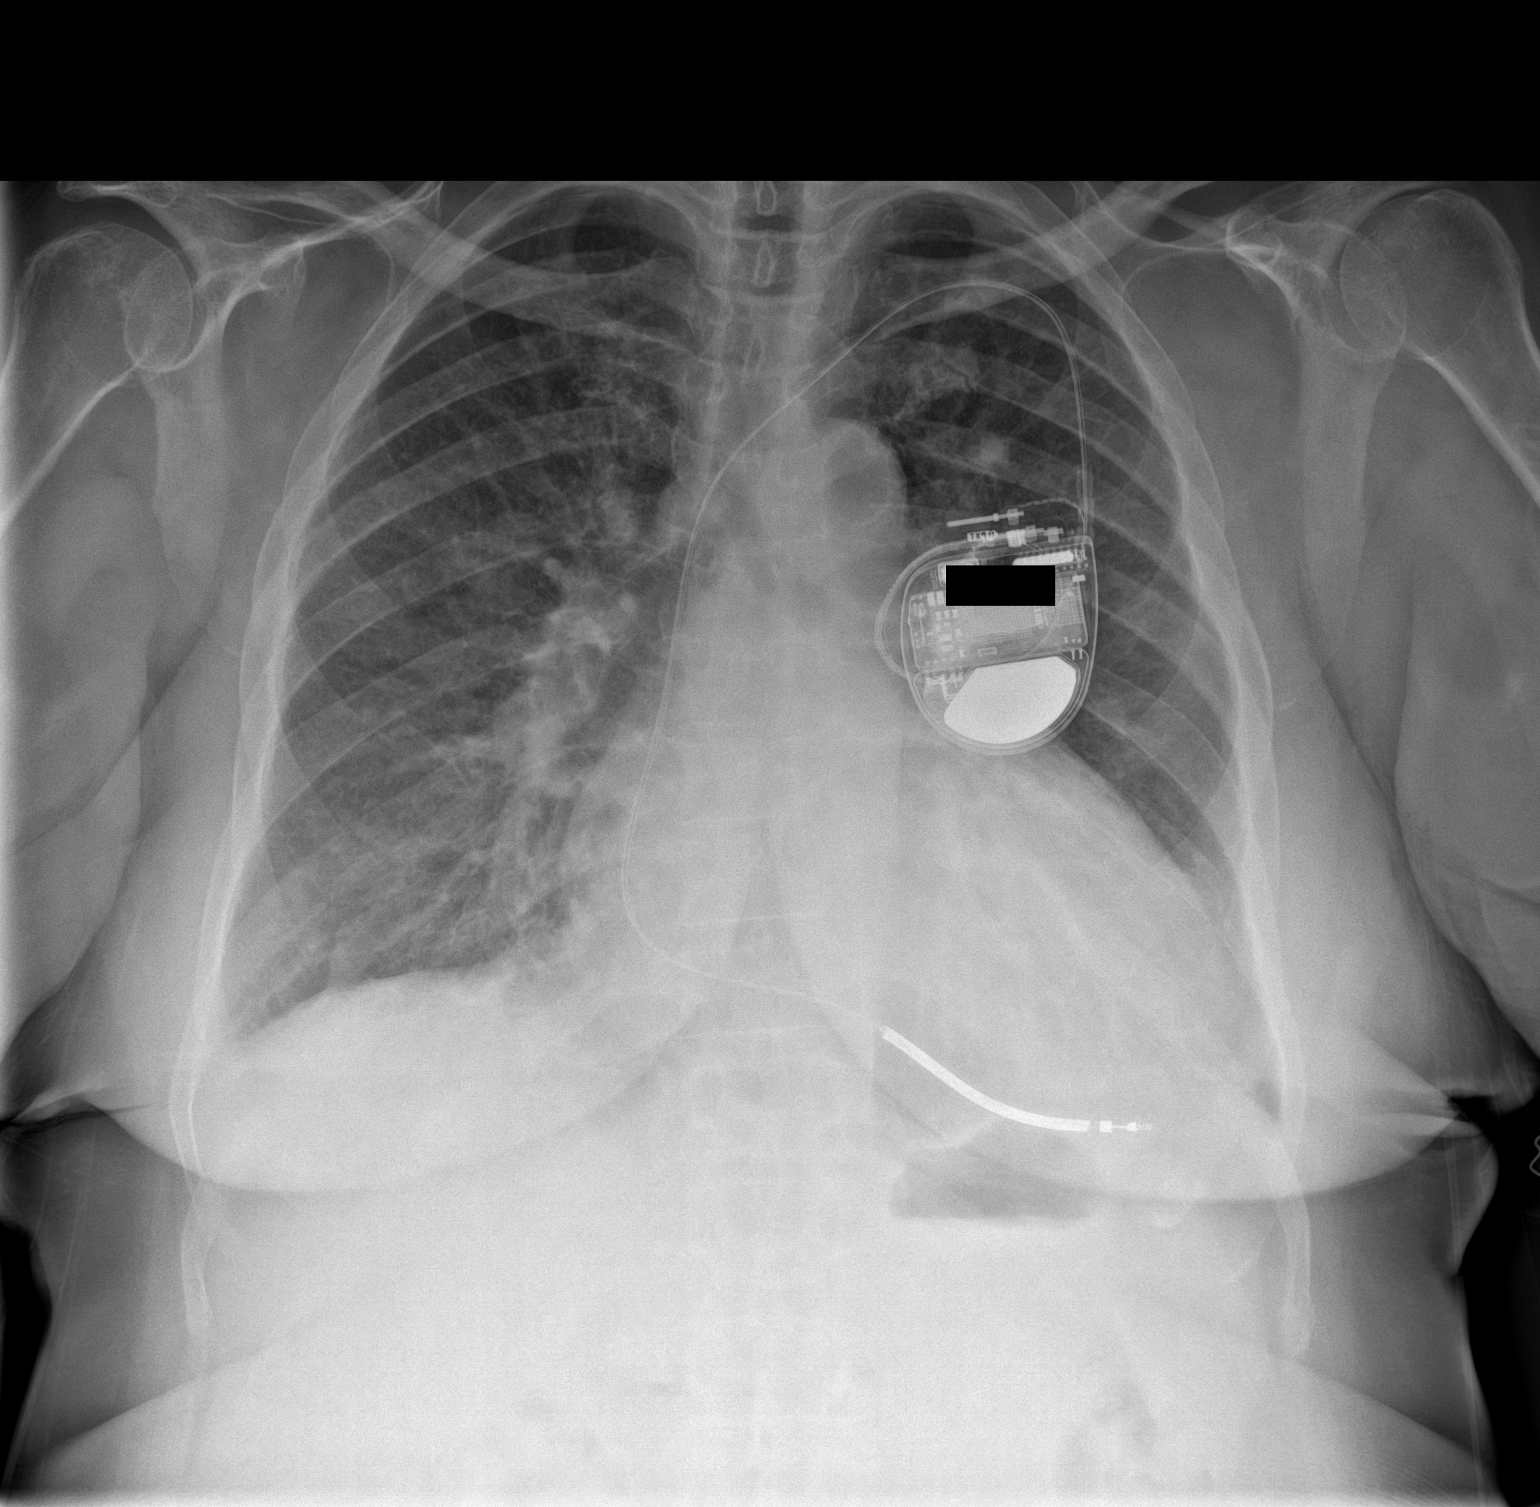

[chest lat]
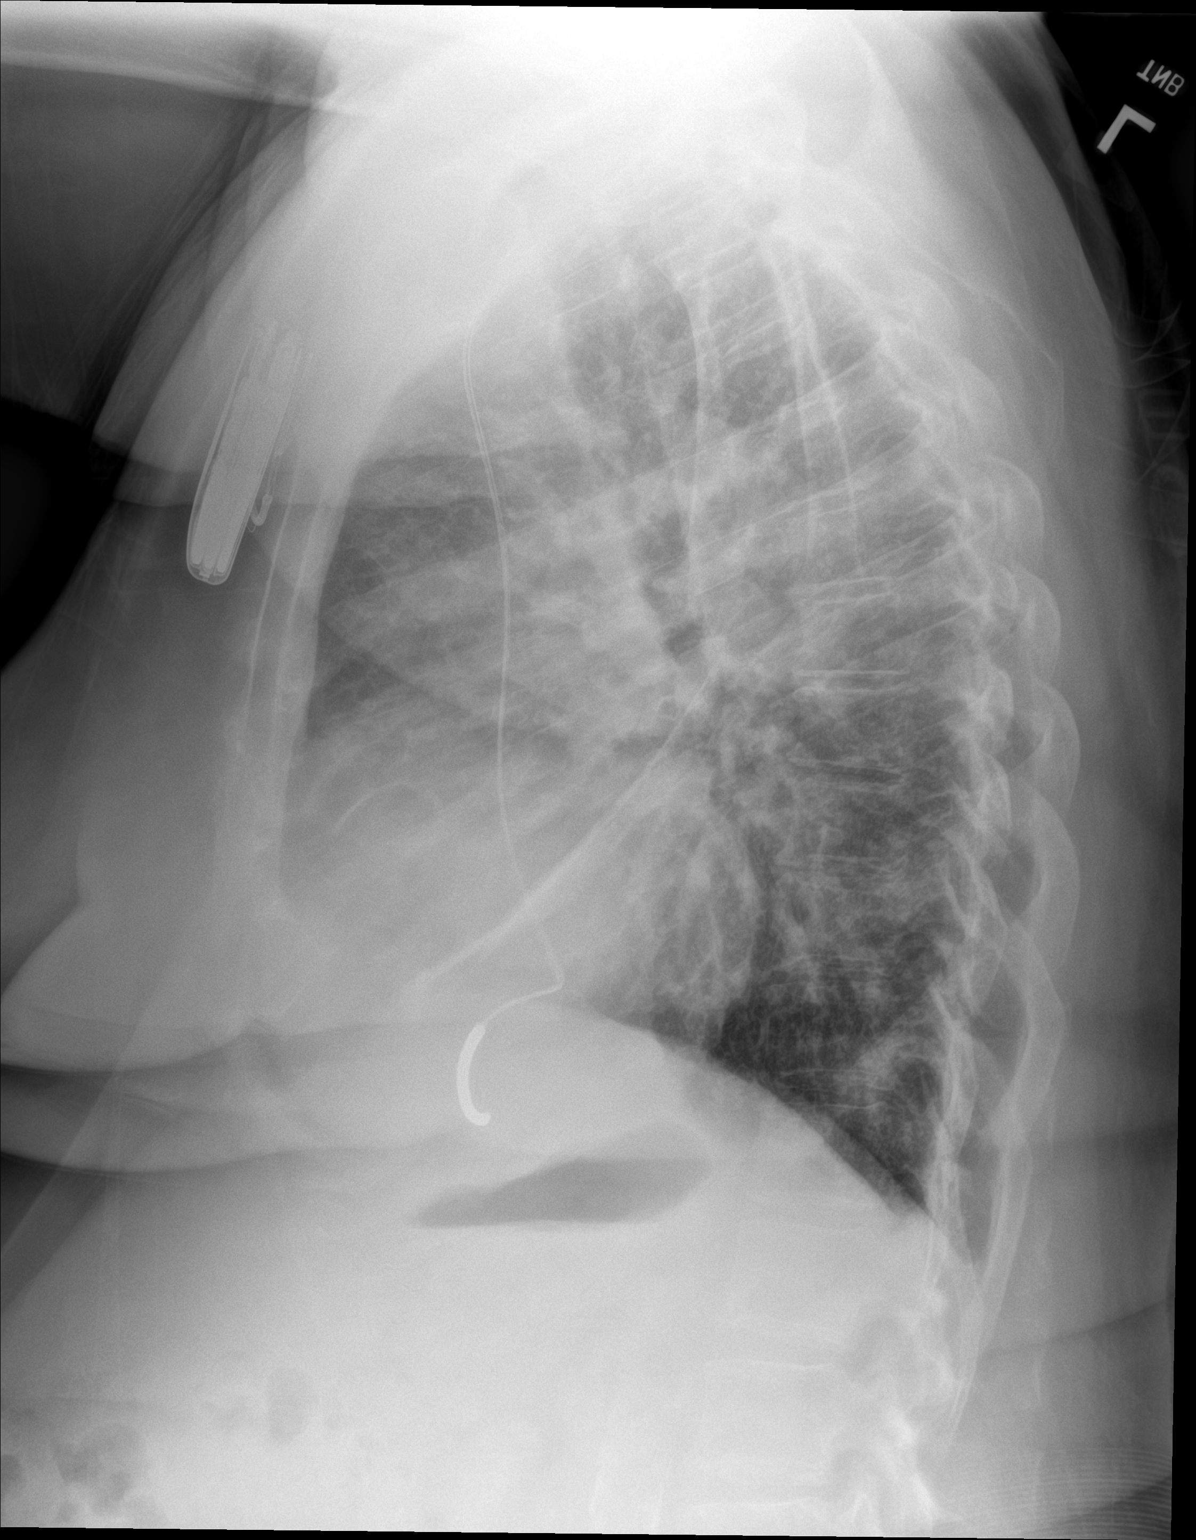

[2 of 2 positions shown; findings below may reference images not displayed]

FINDINGS: Left-sided pacemaker is unchanged in position. The heart is
enlarged, unchanged. There is central pulmonary vascular congestion.
There is no lung infiltrate, pleural effusion or pneumothorax. No
acute fractures are seen.
IMPRESSION: 1. Cardiomegaly with central pulmonary vascular congestion.
